# Patient Record
Sex: Male | Born: 1935 | Race: Black or African American | Hispanic: No | Marital: Married | Smoking: Current every day smoker
Health system: Southern US, Community
[De-identification: ages and names within clinical notes are randomized; demographics above are authoritative.]

## PROBLEM LIST (undated history)

## (undated) DIAGNOSIS — I359 Nonrheumatic aortic valve disorder, unspecified: Secondary | ICD-10-CM

## (undated) DIAGNOSIS — I2510 Atherosclerotic heart disease of native coronary artery without angina pectoris: Secondary | ICD-10-CM

## (undated) DIAGNOSIS — E559 Vitamin D deficiency, unspecified: Secondary | ICD-10-CM

## (undated) DIAGNOSIS — C61 Malignant neoplasm of prostate: Secondary | ICD-10-CM

## (undated) DIAGNOSIS — I351 Nonrheumatic aortic (valve) insufficiency: Secondary | ICD-10-CM

## (undated) DIAGNOSIS — F1010 Alcohol abuse, uncomplicated: Secondary | ICD-10-CM

## (undated) DIAGNOSIS — I4510 Unspecified right bundle-branch block: Secondary | ICD-10-CM

## (undated) DIAGNOSIS — J449 Chronic obstructive pulmonary disease, unspecified: Secondary | ICD-10-CM

## (undated) DIAGNOSIS — Z720 Tobacco use: Secondary | ICD-10-CM

## (undated) DIAGNOSIS — R413 Other amnesia: Secondary | ICD-10-CM

## (undated) DIAGNOSIS — N529 Male erectile dysfunction, unspecified: Secondary | ICD-10-CM

## (undated) DIAGNOSIS — K439 Ventral hernia without obstruction or gangrene: Secondary | ICD-10-CM

## (undated) DIAGNOSIS — I4891 Unspecified atrial fibrillation: Secondary | ICD-10-CM

## (undated) DIAGNOSIS — R361 Hematospermia: Secondary | ICD-10-CM

## (undated) DIAGNOSIS — F419 Anxiety disorder, unspecified: Secondary | ICD-10-CM

## (undated) DIAGNOSIS — R269 Unspecified abnormalities of gait and mobility: Principal | ICD-10-CM

## (undated) DIAGNOSIS — I739 Peripheral vascular disease, unspecified: Secondary | ICD-10-CM

## (undated) DIAGNOSIS — G20 Parkinson's disease: Secondary | ICD-10-CM

## (undated) DIAGNOSIS — I482 Chronic atrial fibrillation, unspecified: Secondary | ICD-10-CM

## (undated) DIAGNOSIS — C3490 Malignant neoplasm of unspecified part of unspecified bronchus or lung: Secondary | ICD-10-CM

## (undated) DIAGNOSIS — H409 Unspecified glaucoma: Secondary | ICD-10-CM

## (undated) DIAGNOSIS — Z923 Personal history of irradiation: Secondary | ICD-10-CM

## (undated) HISTORY — DX: Anxiety disorder, unspecified: F41.9

## (undated) HISTORY — DX: Parkinson's disease: G20

## (undated) HISTORY — DX: Chronic atrial fibrillation, unspecified: I48.20

## (undated) HISTORY — DX: Chronic obstructive pulmonary disease, unspecified: J44.9

## (undated) HISTORY — DX: Unspecified glaucoma: H40.9

## (undated) HISTORY — DX: Other amnesia: R41.3

## (undated) HISTORY — DX: Unspecified right bundle-branch block: I45.10

## (undated) HISTORY — DX: Atherosclerotic heart disease of native coronary artery without angina pectoris: I25.10

## (undated) HISTORY — DX: Malignant neoplasm of prostate: C61

## (undated) HISTORY — DX: Vitamin D deficiency, unspecified: E55.9

## (undated) HISTORY — DX: Alcohol abuse, uncomplicated: F10.10

## (undated) HISTORY — DX: Unspecified abnormalities of gait and mobility: R26.9

## (undated) HISTORY — DX: Malignant neoplasm of unspecified part of unspecified bronchus or lung: C34.90

## (undated) HISTORY — DX: Peripheral vascular disease, unspecified: I73.9

## (undated) HISTORY — DX: Hematospermia: R36.1

## (undated) HISTORY — DX: Tobacco use: Z72.0

## (undated) HISTORY — DX: Male erectile dysfunction, unspecified: N52.9

## (undated) HISTORY — PX: VENTRAL HERNIA REPAIR: SHX424

## (undated) HISTORY — DX: Nonrheumatic aortic (valve) insufficiency: I35.1

## (undated) HISTORY — DX: Nonrheumatic aortic valve disorder, unspecified: I35.9

## (undated) HISTORY — DX: Ventral hernia without obstruction or gangrene: K43.9

---

## 1999-08-25 ENCOUNTER — Encounter: Payer: Self-pay | Admitting: Urology

## 1999-08-25 ENCOUNTER — Encounter: Admission: RE | Admit: 1999-08-25 | Discharge: 1999-08-25 | Payer: Self-pay | Admitting: Urology

## 2000-07-02 ENCOUNTER — Inpatient Hospital Stay (HOSPITAL_COMMUNITY): Admission: AD | Admit: 2000-07-02 | Discharge: 2000-07-05 | Payer: Self-pay | Admitting: Internal Medicine

## 2000-07-06 ENCOUNTER — Other Ambulatory Visit (HOSPITAL_COMMUNITY): Admission: RE | Admit: 2000-07-06 | Discharge: 2000-07-23 | Payer: Self-pay | Admitting: Psychiatry

## 2000-10-24 ENCOUNTER — Emergency Department (HOSPITAL_COMMUNITY): Admission: EM | Admit: 2000-10-24 | Discharge: 2000-10-24 | Payer: Self-pay | Admitting: Emergency Medicine

## 2001-10-11 ENCOUNTER — Ambulatory Visit (HOSPITAL_COMMUNITY): Admission: RE | Admit: 2001-10-11 | Discharge: 2001-10-11 | Payer: Self-pay | Admitting: Gastroenterology

## 2001-10-16 ENCOUNTER — Ambulatory Visit (HOSPITAL_COMMUNITY): Admission: RE | Admit: 2001-10-16 | Discharge: 2001-10-16 | Payer: Self-pay | Admitting: Gastroenterology

## 2001-10-16 ENCOUNTER — Encounter: Payer: Self-pay | Admitting: Gastroenterology

## 2001-12-12 ENCOUNTER — Encounter: Admission: RE | Admit: 2001-12-12 | Discharge: 2001-12-12 | Payer: Self-pay | Admitting: Surgery

## 2001-12-12 ENCOUNTER — Encounter: Payer: Self-pay | Admitting: Surgery

## 2001-12-16 ENCOUNTER — Ambulatory Visit (HOSPITAL_BASED_OUTPATIENT_CLINIC_OR_DEPARTMENT_OTHER): Admission: RE | Admit: 2001-12-16 | Discharge: 2001-12-16 | Payer: Self-pay | Admitting: Surgery

## 2002-06-11 ENCOUNTER — Emergency Department (HOSPITAL_COMMUNITY): Admission: EM | Admit: 2002-06-11 | Discharge: 2002-06-11 | Payer: Self-pay | Admitting: *Deleted

## 2002-08-08 ENCOUNTER — Ambulatory Visit (HOSPITAL_COMMUNITY): Admission: RE | Admit: 2002-08-08 | Discharge: 2002-08-08 | Payer: Self-pay | Admitting: Gastroenterology

## 2004-03-23 ENCOUNTER — Inpatient Hospital Stay (HOSPITAL_COMMUNITY): Admission: EM | Admit: 2004-03-23 | Discharge: 2004-03-30 | Payer: Self-pay | Admitting: Psychiatry

## 2004-03-23 ENCOUNTER — Emergency Department (HOSPITAL_COMMUNITY): Admission: EM | Admit: 2004-03-23 | Discharge: 2004-03-23 | Payer: Self-pay | Admitting: *Deleted

## 2004-03-31 ENCOUNTER — Other Ambulatory Visit (HOSPITAL_COMMUNITY): Admission: RE | Admit: 2004-03-31 | Discharge: 2004-06-29 | Payer: Self-pay | Admitting: Psychiatry

## 2004-06-21 ENCOUNTER — Ambulatory Visit (HOSPITAL_COMMUNITY): Admission: RE | Admit: 2004-06-21 | Discharge: 2004-06-21 | Payer: Self-pay | Admitting: Urology

## 2005-02-01 ENCOUNTER — Ambulatory Visit (HOSPITAL_COMMUNITY): Admission: RE | Admit: 2005-02-01 | Discharge: 2005-02-01 | Payer: Self-pay | Admitting: Urology

## 2005-05-22 ENCOUNTER — Inpatient Hospital Stay (HOSPITAL_COMMUNITY): Admission: EM | Admit: 2005-05-22 | Discharge: 2005-05-31 | Payer: Self-pay | Admitting: Emergency Medicine

## 2005-05-27 ENCOUNTER — Ambulatory Visit: Payer: Self-pay | Admitting: Gastroenterology

## 2007-07-19 ENCOUNTER — Emergency Department (HOSPITAL_COMMUNITY): Admission: EM | Admit: 2007-07-19 | Discharge: 2007-07-19 | Payer: Self-pay | Admitting: Emergency Medicine

## 2007-08-31 ENCOUNTER — Emergency Department (HOSPITAL_COMMUNITY): Admission: EM | Admit: 2007-08-31 | Discharge: 2007-08-31 | Payer: Self-pay | Admitting: Family Medicine

## 2008-08-12 ENCOUNTER — Encounter: Admission: RE | Admit: 2008-08-12 | Discharge: 2008-08-12 | Payer: Self-pay | Admitting: Internal Medicine

## 2009-03-06 ENCOUNTER — Inpatient Hospital Stay (HOSPITAL_COMMUNITY): Admission: EM | Admit: 2009-03-06 | Discharge: 2009-03-09 | Payer: Self-pay | Admitting: Emergency Medicine

## 2009-03-08 ENCOUNTER — Encounter (INDEPENDENT_AMBULATORY_CARE_PROVIDER_SITE_OTHER): Payer: Self-pay | Admitting: Internal Medicine

## 2010-07-26 ENCOUNTER — Ambulatory Visit (HOSPITAL_COMMUNITY)
Admission: RE | Admit: 2010-07-26 | Discharge: 2010-07-26 | Payer: Self-pay | Source: Home / Self Care | Admitting: Urology

## 2010-09-17 ENCOUNTER — Encounter: Payer: Self-pay | Admitting: Urology

## 2010-12-04 LAB — BASIC METABOLIC PANEL
BUN: 16 mg/dL (ref 6–23)
CO2: 27 mEq/L (ref 19–32)
Calcium: 9.5 mg/dL (ref 8.4–10.5)
Chloride: 102 mEq/L (ref 96–112)
Creatinine, Ser: 0.95 mg/dL (ref 0.4–1.5)

## 2010-12-04 LAB — DIFFERENTIAL
Basophils Absolute: 0 10*3/uL (ref 0.0–0.1)
Basophils Relative: 0 % (ref 0–1)
Eosinophils Absolute: 0.1 10*3/uL (ref 0.0–0.7)
Neutrophils Relative %: 86 % — ABNORMAL HIGH (ref 43–77)

## 2010-12-04 LAB — CBC
HCT: 38.3 % — ABNORMAL LOW (ref 39.0–52.0)
HCT: 40.4 % (ref 39.0–52.0)
Hemoglobin: 12.7 g/dL — ABNORMAL LOW (ref 13.0–17.0)
Hemoglobin: 13.4 g/dL (ref 13.0–17.0)
MCHC: 33.1 g/dL (ref 30.0–36.0)
MCHC: 33.2 g/dL (ref 30.0–36.0)
MCHC: 33.5 g/dL (ref 30.0–36.0)
MCV: 89.5 fL (ref 78.0–100.0)
MCV: 90.2 fL (ref 78.0–100.0)
Platelets: 170 10*3/uL (ref 150–400)
RBC: 4.48 MIL/uL (ref 4.22–5.81)
RDW: 17.1 % — ABNORMAL HIGH (ref 11.5–15.5)

## 2010-12-04 LAB — COMPREHENSIVE METABOLIC PANEL
Alkaline Phosphatase: 48 U/L (ref 39–117)
BUN: 12 mg/dL (ref 6–23)
BUN: 12 mg/dL (ref 6–23)
CO2: 28 mEq/L (ref 19–32)
Calcium: 8.9 mg/dL (ref 8.4–10.5)
Creatinine, Ser: 0.92 mg/dL (ref 0.4–1.5)
GFR calc non Af Amer: >60 mL/min (ref >60)
Glucose, Bld: 104 mg/dL — ABNORMAL HIGH (ref 70–99)
Glucose, Bld: 109 mg/dL — ABNORMAL HIGH (ref 70–99)
Potassium: 3.7 mEq/L (ref 3.5–5.1)
Total Protein: 6.8 g/dL (ref 6.0–8.3)

## 2010-12-04 LAB — URINALYSIS, ROUTINE W REFLEX MICROSCOPIC
Hgb urine dipstick: NEGATIVE
Nitrite: NEGATIVE
Specific Gravity, Urine: 1.01 (ref 1.005–1.030)
Urobilinogen, UA: 0.2 mg/dL (ref 0.0–1.0)
pH: 5 (ref 5.0–8.0)

## 2010-12-04 LAB — CK TOTAL AND CKMB (NOT AT ARMC)
CK, MB: 1.2 ng/mL (ref 0.3–4.0)
Total CK: 161 U/L (ref 7–232)

## 2010-12-04 LAB — HEPATIC FUNCTION PANEL
ALT: 11 U/L (ref 0–53)
AST: 21 U/L (ref 0–37)
Albumin: 3.1 g/dL — ABNORMAL LOW (ref 3.5–5.2)
Indirect Bilirubin: 0.5 mg/dL (ref 0.3–0.9)
Total Protein: 6.4 g/dL (ref 6.0–8.3)

## 2010-12-04 LAB — T4, FREE: Free T4: 0.92 ng/dL (ref 0.80–1.80)

## 2010-12-04 LAB — PROTIME-INR
INR: 1.1 (ref 0.00–1.49)
Prothrombin Time: 16 seconds — ABNORMAL HIGH (ref 11.6–15.2)

## 2010-12-04 LAB — TSH: TSH: 3.249 u[IU]/mL (ref 0.350–4.500)

## 2010-12-04 LAB — URINE CULTURE: Colony Count: NO GROWTH

## 2010-12-04 LAB — CARDIAC PANEL(CRET KIN+CKTOT+MB+TROPI)
Relative Index: 0.7 (ref 0.0–2.5)
Total CK: 213 U/L (ref 7–232)
Troponin I: 0.02 ng/mL (ref 0.00–0.06)

## 2011-01-10 NOTE — Discharge Summary (Signed)
NAMESHELDON, Mitchell Wood                 ACCOUNT NO.:  [id number]   MEDICAL RECORD NO.:  [id number]          PATIENT TYPE:  INP   LOCATION:  1416                         FACILITY:  [facility]   PHYSICIAN:  Charlestine Massed, MDDATE OF BIRTH:  07-02-1935   DATE OF ADMISSION:  03/06/2009  DATE OF DISCHARGE:  03/09/2009                               DISCHARGE SUMMARY   PRIMARY CARE PHYSICIAN:  Cala Bradford R. [name], M.D.   CARDIOLOGY:  Nanetta Batty, M.D. of [facility].   UROLOGY:  Valetta Fuller, M.D. of [facility].   REASON FOR ADMISSION:  Fever.   DISCHARGE DIAGNOSES:  1. Paroxysmal atrial fibrillation, rate controlled, aspirin.  2. History of prostate cancer.  3. Local reaction to Lupron shot in the right buttock with associated      fever and chills - no evidence of any other localized infection.  4. Glaucoma.  5. Vitamin D deficiency.   DISCHARGE MEDICATIONS:  1. Aspirin 325 mg p.o. daily.  2. Lopressor 12.5 mg p.o. b.i.d.  3. Vitamin D 50,000 units p.o. on Mondays and Fridays.  4. Albuterol MDI two puffs q.6 hours p.r.n. for wheeze.  5. Nicotine patch 21 mg per day for 7 days, then 14 mg per day for 10      days, then 7 mg per day for 10 days and then stop.   HOSPITAL COURSE BY PROBLEM:  1. Local reaction to Lupron - the patient had a Lupron shot as      treatment for his prostate cancer in the afternoon at Dr. Ellin Goodie      office.  He came home and he was fine until late night he suddenly      started getting severe pain in the area associated with fever and      chills.  He did not have any rash.  No wheezing.  No shortness of      breath.  No other issues.  The patient was seen in the ER.  His      temperature was found to be 102 and he did not have any other      symptoms.  The patient had localized tenderness in the area of the      Lupron shot.  So the patient was observed on the floor.  He was      admitted and he was evaluated for  any other further issues causing      fever.  A chest x-ray did not reveal any evidence of infection and      showed chronic lung disease due to his history of tobacco abuse.      His urinalysis was negative for any urinary tract infection and his      CBC showed a slightly elevated WBC count of 17,000 which possibly      could have happened from the inflammatory reaction associated with      the shot.  He did not have any other sources of infection noted.      His mental status was very well, stable.  The patient did not  have      any further fevers and also along with the fever, his pain in the      Lupron shot area also subsided.  There was no further tenderness of      this area so the patient has been advised to speak to Dr. Isabel Caprice      next time when he goes to see him about the reaction he had with      the Lupron shot.  Currently, the site is nontender.  No swelling      noted and no induration noted and no evidence of any internal      abscesses there.  2. Paroxysmal atrial fibrillation - the patient was seen in the      emergency room and he was found to be having atrial fibrillation      and so Cardiology was also called and newly diagnosed atrial      fibrillation.  He had an echocardiogram done also the following      day.  He was seen by [facility], Dr. Allyson Sabal.      Echocardiogram revealed moderate aortic regurgitation and mild      mitral regurgitation with some calcified aortic leaflets.  LA was      mildly dilated with ejection fraction of 55% to 60%.  The patient      was seen by Dr. Allyson Sabal and then by Dr. Lynnea Ferrier.  I spoke to Dr.      Lynnea Ferrier who stated that in view of the very low [place] score, he can      safely go home on aspirin.  The patient also has issues of      increased bleeding from his nose.  The patient stated that if he      laughs loud for a minute or so, he starts bleeding from his nose      and this has been going on for a long time.  So,  in view of these      factors, the patient is being advised to stay on aspirin 325 mg      daily.  The patient's rhythm is currently in sinus rhythm.  He has      paroxysmal atrial fibrillation.  The patient has been advised to      follow up with Dr. Allyson Sabal in 1 weeks' time and at that time further      decision about any Coumadin needs will be decided.  Currently, the      patient is discharged back on aspirin.  3. Aspirin and Lopressor 12.5 b.i.d. for rate control.  4. Tobacco abuse and chronic lung disease - the patient has been      explained about the issues associated with the tobacco abuse.  He      has been told that the x-ray is showing evidence of chronic lung      disease and possibly the paroxysmal atrial fibrillation is      secondary to the lung disease.  The patient is very well motivated      about starting and stopping smoking.  He has been prescribed      nicotine patches and he has been advised to stop smoking.  He has      exhibited understanding with regards to that.  5. History of prostate cancer.  The patient to follow up with Dr.      Isabel Caprice as an outpatient for further management of the condition and  has been advised to speak to Dr. Isabel Caprice about the reaction he      developed to the Lupron shot.   TESTS DONE DURING THIS ADMISSION:  1. Echocardiogram.  Ejection fraction 55% to 60%.  No regional wall      motion abnormalities.  Mitral annular and aortic valve calcified      annulus with mild-to-moderate regurgitation valvularly at 2.13 cm      squared.  Mild mitral regurgitation.  Left atrium mildly dilated.      Mild tricuspid regurgitation with pulmonary artery systolic      pressure 41 mmHg.  2. Chest x-ray shows chronic interstitial lung disease.  No acute      cardiopulmonary process.  3. TSH 3.249 and free T4 is 0.92.   DISPOSITION:  Discharged back home.   FOLLOWUP:  1. Follow up with Dr. Allyson Sabal in 1 week.  2. Follow up with Dr. Andi Devon  in 3 weeks.  3. Follow up with Dr. Isabel Caprice as per the appointment given.   DISCHARGE INSTRUCTIONS:  1. Stop smoking.  2. Adhere to the appointment.  3. If develop any chest pain or any further palpitations, go to the      emergency room.  The patient has been explained about this and he      has exhibited understanding.  4. A total of 40 minutes was spent on the discharge today.      Charlestine Massed, MD  Electronically Signed     UT/MEDQ  D:  03/09/2009  T:  03/09/2009  Job:  [id number]   cc:   Merlene Laughter. [name], M.D.  Fax: [phone]   Valetta Fuller, M.D.  Fax: [phone]   Nanetta Batty, M.D.  Fax: [phone]

## 2011-01-10 NOTE — H&P (Signed)
Mitchell Wood, Mitchell Wood                 ACCOUNT NO.:  [id number]   MEDICAL RECORD NO.:  [id number]          PATIENT TYPE:  INP   LOCATION:  1416                         [facility]ACILITY:  [facility]   PHYSICIAN:  Massie Maroon, MD        DATE OF BIRTH:  17-Jan-1935   DATE OF ADMISSION:  03/06/2009  DATE OF DISCHARGE:                              HISTORY & PHYSICAL   CHIEF COMPLAINT:  Fever.   HISTORY OF PRESENT ILLNESS:  A 76 year old male with prostate cancer  complains of fever of 102 last night.  The patient apparently had  received a Lupron shot yesterday into the right hip/ buttock.  He states  that the area is slightly sore.  There is no redness.  The patient  denies any sore throat, chest pain, palpitations, shortness of breath,  nausea, vomiting, abdominal pain, diarrhea, dysuria, hematuria or  increased frequency of urination.  The patient was brought to the ED by  his wife and found to have slight temperature of 100.1.  He was slightly  tender over the right buttock/ hip area where the injection had  occurred.  There was some slight tenderness however, no redness or  warmth.  Patient's white count was noted to be slightly elevated with a  white count of 17.0 and he was also noted to be slightly tachycardic  with a heart rate of up to 130 in the ED which fluctuated.  I  wonder if  it may been due to pain of palpating on his right buttock where the  Lupron injection was.  The patient will be admitted for observation of  complaints of fever.  There is no clear source at this time.  I wonder  if it may have been a local reaction to the Lupron shot.  He does note  some slight cough but that is nonproductive and his chest x-ray is  negative for any acute process.  We will continue to monitor him for now  for fever.   PAST MEDICAL HISTORY:  1. Prostate cancer.  2. Glaucoma.  3. Vitamin D deficiency.  4. C diff in 2006.   PAST SURGICAL HISTORY:  1. Abdominal hernia repair at [facility]  5  to 6 years ago.  2. 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   SOCIAL HISTORY:  The patient does not drink.  He smokes one pack per day  times 40 years.  He is married to his second wife.  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   FAMILY HISTORY:  His mother died at age 57 of epistaxis.  Father died at  age 38 of old age.  He notes he has a family history of diabetes as well  as cancer.   ALLERGIES:  No known drug allergies.   MEDICATIONS:  1. Travatan.  2. Albuterol.  3. Vitamin D two times per week (International units).  4. Lupron.   REVIEW OF  SYSTEMS:  Positive for smoker's cough, otherwise negative for  all 10 organ systems except for pertinent positives stated above.  Note:  The patient notes no tick bite or tick exposure or recent travel.   PHYSICAL EXAMINATION:  VITAL SIGNS:  Temperature 100.1, pulse 92, blood  pressure 133/57, pulse ox 97% on room air.  HEENT: Anicteric, EOMI, no nystagmus, pupils 1.5 mm, symmetric, direct,  consensual, near reflexes intact, positive arcus senilis, mucous  membranes moist.  Tongue midline.  NECK:  No JVD, no bruit, no thyromegaly, no adenopathy.  HEART:  Intermittently tachy, S1-S2, no murmurs, gallops or rubs.  LUNGS:  Clear to auscultation bilaterally.  ABDOMEN:  Soft, nontender, nondistended.  Positive bowel sounds.  EXTREMITIES:  No cyanosis, clubbing or edema, PT pulses 2+ bilaterally,  onychomycosis.  SKIN:  No rashes.  LYMPHATICS:  No adenopathy, the patient does have some slight tenderness  over the area where he was injected with Lupron.  There is no erythema  or warmth.  There is a large seborrheic keratosis on his right buttock.  NEURO:  Nonfocal, cranial nerves II-XII intact, reflexes 2+, symmetric,  diffusely  downgoing toes bilaterally, motor strength 5/5 in all four  extremities, pinprick  intact.   Chest x-ray:  Stable exam, chronic underlying in his interstitial  changes with no acute cardiopulmonary process.   LABS:  WBC 17.0, hemoglobin 14.3, platelet count 170, sodium 136,  potassium 4.7, BUN 16, creatinine 0.95.  Urinalysis negative.  CPK 161,  MB 1.2, troponin-I is 0.03, D-dimer 0.36.   ASSESSMENT AND [place]velt Harps:  1. Fever:  We will check blood cultures x2 sets.  We will check liver      function tests.  I believe that this is probably a local site      reaction.  There is obviously possibility of occult prostatitis or      possibly some bronchitis.  However, he has not had any sick      contacts and complains of no dysuria or increased frequency.  We      will continue to monitor his temperature curve but hold off on any      antibiotics at this time.  2. Tachycardia (intermittent) possibly secondary to pain or anxiety.      D-dimer is negative.  We will check a 12-lead EKG.  We will check      troponin-I q.8 h. x3 sets.  If he does not have any further      episodes of tachycardia, then the intermittent episode that I saw      in the ED was most likely secondary to pain.  3. Prostate cancer:  The patient can follow-up with his neurologist,      Dr. Patsi Sears as an outpatient.  4. Vitamin D deficiency:  The patient will continue on vitamin D.  The      patient should follow-up with his primary      care physician for vitamin D level.  5. Glaucoma:  We will continue Travatan at this time.  6. DVT prophylaxis:  Lovenox 40 mg subcu q. day.      Massie Maroon, MD  Electronically Signed     JYK/MEDQ  D:  03/06/2009  T:  03/06/2009  Job:  [id number]   cc:   Lynelle Smoke I. Patsi Sears, M.D.  Fax: [phone]   Merlene Laughter. [name], M.D.  Fax: [phone]

## 2011-01-13 NOTE — H&P (Signed)
Mitchell Wood, Mitchell Wood                             ACCOUNT NO.:  [id number]   MEDICAL RECORD NO.:  [id number]                   PATIENT TYPE:  IPS   LOCATION:  0507                                 FACILITY:  [facility]   PHYSICIAN:  Jeanice Lim, M.D.              DATE OF BIRTH:  1935-02-08   DATE OF ADMISSION:  03/23/2004  DATE OF DISCHARGE:                         PSYCHIATRIC ADMISSION ASSESSMENT   IDENTIFYING INFORMATION:  This is a 76 year old widowed African-American  male who is a voluntary admission.   HISTORY OF PRESENT ILLNESS:  This patient presented in the emergency  department after his daughter called 911.  He fell down and had had a  seizure, states that he had been drinking about 1/2 of a fifth of liquor a  day for the past 2 months and had decided about 4 or 5 days prior to this  incident that he was going to stop.  He says that he had stopped completely,  had managed to stay away from the alcohol, but was having terrible problems  with shaking and sweating.  He denies that he has every had a seizure in the  past, although he has had some episodes of blackouts with drinking.  He  reports a history of alcohol use since age 53, with 18 years clean and  sober.  He resume use of alcohol socially when he retired approximately 3  years ago.  Initially he was drinking several times a week socially and over  the past couple of months this has escalated now to drinking about 1/2 a  fifth per day.  He denies any other substance abuse history.  He denies  depressed mood, suicidal thoughts, mood fluctuations or impulsivity.   PAST PSYCHIATRIC HISTORY:  This is the first admission to [facility].  The patient does report that he has a history of  1 prior detox when he was living in the [place] many years ago and does not  remember exactly what hospital he was in.  He denies any history of  depression, suicidal ideation, or prior suicide attempt or mood  problems.   SOCIAL HISTORY:  This is a 76 year old African-American male who was widowed  approximately 5 years ago.  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  He did live in [place], [place] for many years, several years ago  returned to [place] after retirement and lives with [place] in  [place].  He does admit to losing his license approximately 4  years ago due to charges related to alcohol, is due to get his license back  in approximately 4 weeks.  No current DUI charges, no current legal  problems.   FAMILY HISTORY:  Unclear.   ALCOHOL AND DRUG HISTORY:  As noted above.  He denies any other substance  abuse.  He does smoke approximately 1 pack per  day of cigarettes.   PAST MEDICAL HISTORY:  The patient is followed by Dr. Andi Devon, M.D.  who is his primary care physician.  Medical problems include a recent  diagnosis of prostatitis.  The patient was prescribed Cipro 500 mg b.i.d.  for 7 days but says that he really does not remember if he was able to take  any of them at all.   MEDICATIONS:  Cipro 500 mg p.o. in the emergency department, and to take  b.i.d. x7.  He was diagnosed with hypokalemia in the emergency room and was  given 40 mEq of potassium.   DRUG ALLERGIES:  ASPIRIN.   POSITIVE PHYSICAL FINDINGS:  The patient's full physical examination was  done in the [facility] emergency room by Dr. Mosetta Putt and is noted in the  record.  On admission, we note that he is a 5 feet 10 inch tall African-  American male who appears to be his stated age, weighs 183 pounds.  His  vital signs:  He was afebrile, pulse 88, today apically it is regular,  strong.  Radial pulse is synchronous with apical pulse.  Respirations 16,  blood pressure 110/49.  Neuro exam is grossly normal.  Extraocular movements  are normal, no nystagmus.  Grip strength equal.  The patient is fully alert,  tracks well.  Cerebellar function is in intact.  Gait normal, normal  arm  swing, balance good.  Romberg without findings.  No focal findings.  In the  emergency room the patient had an EKG and was cleared.  They did start IV  fluids on him.  He was given one ampule of multivitamin and folate, along  with 2 mg of Ativan.   DIAGNOSTIC STUDIES:  His urine drug screen was negative.  His metabolic  panel revealed hypokalemia with a potassium of 2.9.  His SGOT was elevated  at 66, SGPT at 84.  His alcohol level was less than 5.  His TSH is within  normal limits at 4.769.  CBC was remarkable for platelets at 90.  The  patient displays no obvious bruising or signs of bleeding.  His hemoglobin  15.3, hematocrit 43.8.   MENTAL STATUS EXAM:  This is a fully alert male who is pleasant and  cooperative with smooth motor exam and appears in no distress at the time of  exam.  Speech is normal in pace, tone and amount.  Mood is euthymic,  he is  candid about his alcohol use, matter of fact about his drinking.  Thought  process is logical and coherent, no suicidal or homicidal thought, no signs  of psychosis, no hallucinations, thinking is clear, insight is adequate,  impulse control and judgment within normal limits. Cognitively he is intact  and oriented x3.   ADMISSION DIAGNOSIS:   AXIS I:  Alcohol abuse and dependence.   AXIS II:  No diagnosis.   AXIS III:  Thrombocytopenia, seizure probably secondary to alcohol  withdrawal, and hypokalemia, prostatitis by history.   AXIS IV:  Deferred.   AXIS V:  Current 24, past year 57.   INITIAL PLAN OF CARE:  Voluntarily admit the patient with q.15 minute checks  in place for a safe detox from alcohol.  We have elected to start him on a  Librium protocol and gave him a 50 mg loading dose initially.  We are also  going to recheck his platelets and get a metabolic panel to recheck his  potassium level in the morning and we will  give him a 21 mg nicotine patch and we will check a magnesium level.   ESTIMATED LENGTH OF  STAY:  5 days.     Margaret A. Stephannie Peters                   Jeanice Lim, M.D.    MAS/MEDQ  D:  03/25/2004  T:  03/25/2004  Job:  [id number]

## 2011-01-13 NOTE — H&P (Signed)
Mitchell Wood, Mitchell Wood                 ACCOUNT NO.:  [id number]   MEDICAL RECORD NO.:  [id number]          PATIENT TYPE:  EMS   LOCATION:  MAJO                         FACILITY:  [facility]   PHYSICIAN:  Michaelyn Barter, M.D. DATE OF BIRTH:  [birth year]   DATE OF ADMISSION:  05/22/2005  DATE OF DISCHARGE:                                HISTORY & PHYSICAL   PRIMARY CARE PHYSICIAN:  Dr. Andi Devon   GASTROENTEROLOGIST:  Dr. Anselmo Rod   UROLOGIST:  Dr. Isabel Caprice at the urology center   CHIEF COMPLAINT:  Uncontrollable diarrhea.   HISTORY OF PRESENT ILLNESS:  Mitchell Wood is a 76 year old gentleman with a past  medical history of alcoholism and prostate cancer who states that this past  Saturday he began to experience difficulty controlling both his bladder and  his bowels.  States that his stools had been dark/black with obvious dark  specks of blood mixed in with it.  According to the girlfriend who is at his  bedside the patient has been losing his bowels every couple of hours since  Saturday.  He has defecated over the furniture and the bed at their home.  He currently denies any nausea, vomiting, fevers, or chills.  No pain.  States that he does not have a good sense as to when he has the urge to  defecate.  He denies having any chest pain, no shortness of breath, no  abdominal pain and no palpitations.  His girlfriend states that the patient  has lost approximately 12 pounds over the past month.  The patient states  that he has not had any appetite to eat but continues to drink.  He was seen  by his primary care physician Dr. [name] this morning at approximately 9:30  and subsequently was referred to the hospital for further evaluation.   PAST MEDICAL HISTORY:  1.  Questionable cirrhosis of the liver.  2.  Alcoholism.  3.  Prostate cancer diagnosed 8 years ago.  The patient received irradiation      and hormone therapy for this.  He was in remission for several years  however during the earlier portion of 2006 his cancer was discovered to      have returned.  4.  Alcohol withdrawal related seizures.  5.  Shunt placed in the left arm in May 2006.  6.  Asthma.  7.  The patient had a colonoscopy completed October 11, 2001 by Dr. Charna Elizabeth.  He also had an EGD completed October 11, 2001 which found a      normal-appearing esophagus and proximal small bowel and old heme in the      stomach, no definite source of bleeding identified.   PAST SURGICAL HISTORY:  1.  Umbilical hernia repair.  2.  Penile device insertion.  3.  Bilateral cataract surgery.   ALLERGIES:  No known drug allergies.   HOME MEDICATIONS:  None.   SOCIAL HISTORY:  The patient lives with his girlfriend.  Alcohol:  The  patient and his girlfriend both admit that the patient is an  alcoholic.  He  started drinking at the age of 60.  He has had approximately 20 years  sobriety however started drinking again heavily approximately 2 years ago.  He admits to drinking one fifth of bourbon every day.  His last drink was  yesterday and according to his girlfriend he drank a half gallon of liquor.  Cigarettes:  One pack per day started at the age of 66.   FAMILY HISTORY:  Mother history unknown.  Father has a history of prostate  cancer.  The patient has five brothers all with a history of cancer.   PHYSICAL EXAMINATION:  GENERAL:  The patient is cooperative.  He is awake.  He is in no obvious distress.  Occasionally he appears to be slightly  confused but this may also be secondary to him having difficulties with  regards to hearing.  He has an episode of bowel incontinence in right in  front of me.  He sensed the urge to defecated, but could not make it to the  toilet in time, and passed a large amount of stool, while wearing his  underwear, sitting on the stretcher.  VITALS:  At initial time of admission the patient's temperature was found to  be 99.2, blood pressure 130/76,  heart rate 128, respirations 20, O2  saturation 98% on room air.  HEENT:  Bilateral muddy sclerae.  Pupils are constricted but they do react  equally to light.  Oral mucosa is pink.  No thrush, no exudates are present.  NECK:  The neck is supple.  Strong bilateral carotid upstrokes.  No bruits  are auscultated.  CARDIAC:  Heart sounds are irregular.  RESPIRATORY:  Clear.  No crackles or wheezes.  ABDOMEN:  Soft, nontender, nondistended.  Bowel sounds are present x4  quadrants.  Positive hepatomegaly.  RECTUM:  Poor tone.  Stool is brown, liquidy in consistency.  The patient is  gross Hemoccult positive.  NEUROLOGICAL:  He is alert and oriented x3.  Cranial nerves II-XII are  intact.  MUSCULOSKELETAL:  He has 5/5 upper and lower extremity strength.   LABORATORIES:  White blood cell count is 11.5, hemoglobin 10.4, hematocrit  31, platelets are 83.  Sodium is 135, potassium 3.1, chloride is 90, CO2 is  25, BUN is 11, creatinine is 1.2, glucose 101, calcium is 8.4, total protein  is 6.9, albumin 2.9, AST is 53, ALT is 18, alk phos 74, total bilirubin is  2.5.   ASSESSMENT AND PLAN:  Problem 1. GASTROINTESTINAL BLEED.  The etiology of  this at this particular time is questionable and the differential is broad.  We will consult gastroenterology for further evaluation.  The patient will  most likely need a colonoscopy and/or EGD.  We will check H&H q.6 h. and  provide aggressive intravenous fluid hydration.  We will also provide  Protonix 40 mg daily.   Problem 2. ALCOHOL ABUSE.  We will initiate withdrawal protocol for now as  the patient is high risk to undergo alcohol withdrawal related symptoms.   Problem 3. HISTORY OF PROSTATE CANCER.  We will monitor.   Problem 4. QUESTIONABLE CIRRHOSIS OF THE LIVER.  We will monitor this for  now.   Problem 5. HISTORY OF ATRIAL FIBRILLATION WITH RAPID VENTRICULAR RESPONSE. We will continue Cardizem for now.  We will check cardiac enzymes, a TSH  and  even check an alcohol level.  We will consider cardiology consultation.   Problem 6. HYPOKALEMIA.  We will supplement.   Problem 7.  LEUKOCYTOSIS.  We will monitor this for now as the patient is  afebrile.      Michaelyn Barter, M.D.  Electronically Signed     OR/MEDQ  D:  05/22/2005  T:  05/22/2005  Job:  [id number]   cc:   Merlene Laughter. [name], M.D.  Fax: [phone]   HYQMVH QIO NGEX, M.D.  Fax: [phone]   Valetta Fuller, M.D.  Fax: [phone]

## 2011-01-13 NOTE — Discharge Summary (Signed)
NAMEDEMETRY, Mitchell Wood                 ACCOUNT NO.:  [id number]   MEDICAL RECORD NO.:  [id number]          PATIENT TYPE:  INP   LOCATION:  3714                         FACILITY:  [facility]   PHYSICIAN:  Hillery Aldo, M.D.   DATE OF BIRTH:  Sep 06, 1934   DATE OF ADMISSION:  05/22/2005  DATE OF DISCHARGE:  05/31/2005                                 DISCHARGE SUMMARY   PRIMARY CARE PHYSICIAN:  Dr. Andi Devon.   GASTROENTEROLOGIST:  Dr. Anselmo Rod.   DISCHARGE DIAGNOSES:  1.  Gastrointestinal bleed.  2.  Anemia secondary to #1.  3.  Gastritis.  4.  C. difficile colitis.  5.  Alcohol abuse with delirium tremors and withdrawal syndrome.  6.  History of recurrent prostate cancer.  7.  Aspiration pneumonia.  8.  Tachycardia.  9.  Hypokalemia.  10. Hypomagnesemia.   DISCHARGE MEDICATIONS:  1.  Multivitamin daily.  2.  Metronidazole 500 mg p.o. three times daily x8 more days.  3.  Lopressor 25 mg p.o. twice daily.  4.  Protonix 40 mg daily.  5.  Magnesium oxide 400 mg daily x1 week.   CONSULTATIONS:  Dr. Jeani Hawking of Gastroenterology.   PROCEDURES AND DIAGNOSTIC STUDIES:  1.  Chest x-ray on May 22, 2005 showed stable atelectasis and/or      scarring, right lower lobe.  Mild aortic elongation and calcification.      No acute infiltrate, edema, or pneumothorax.  2.  Repeat chest x-ray on May 28, 2005 showed a new opacity in the right      hilar and perihilar region.  Airspace disease is a consideration.      Followup films to resolution are recommended.  3.  Panendoscopy on May 27, 2005 showed mild, nonspecific gastritis.      No ulcerations or erosion. H.  pylori testing was negative.   DISCHARGE LABORATORY VALUES:  CBC showed a white blood cell count of 8.6,  hemoglobin 8.8, hematocrit 26.3, platelets 304,000.  Sodium was 136,  potassium 3.5, chloride 105, bicarb 22, BUN 5, creatinine 0.7, glucose 104.  Magnesium was 1.0.   BRIEF ADMISSION HISTORY OF  PRESENT ILLNESS:  The patient is a 76 year old  male with a past medical history of alcohol abuse, who came into the  hospital on May 22, 2005 with complaints of black diarrheal stools,  poor appetite, weight loss, and history of heavy alcohol abuse.  He was  admitted for further evaluation and workup.   HOSPITAL COURSE BY PROBLEM.:  PROBLEM #1 - GASTROINTESTINAL BLEED WITH BLOOD  LOSS ANEMIA:  The patient was admitted and consultation with  Gastroenterology was obtained.  Serial checks of his hemoglobin were done.  He subsequently underwent endoscopy on May 27, 2005 with nonspecific  gastritis and no active bleeding.  Gastroenterology recommended proton pump  inhibitor therapy daily.  H. Pylori was negative.  His anemia remained  stable throughout the course of his hospitalization, running 8-9 g on  average.  He will need close followup as an outpatient to ensure that his  hemoglobin remains stable.   PROBLEM # 2 -  C. DIFFICILE COLITIS:  The patient did have significant  diarrhea on admission and a stool was sent for toxin analysis.  It did come  back positive and the patient was started on Flagyl.  He has completed a 7-  day course of Flagyl in the hospital and will need an additional 8 days of  therapy to ensure eradication.  He is at high risk for reinfection, so he  should follow up with his primary care physician for any further episodes of  diarrhea, once his treatment course is completed.   PROBLEM #3 - ALCOHOL ABUSE:  The patient did admit to significant alcohol  abuse and did develop withdrawal with delirium.  He was treated with an  Ativan taper.  He responded well and at the time of this dictation, is  mentating clearly.  He was also maintained on thiamine and folate  supplementation.  He will be referred for outpatient alcohol abuse  counseling on discharge.   PROBLEM #4 - QUESTIONABLE ASPIRATION PNEUMONIA:  The patient did have a  chest x-ray that was  worrisome for aspiration pneumonia.  He had low-grade  fevers, but no elevation of white blood cell count.  Given this, he was  empirically started on Avelox, but this was discontinued after 2 days of  therapy, given the concerns of his concomitant therapy for C. difficile  colitis.  Unless he develops clear evidence of pneumonia, wound avoid using  any other antibiotics if at all possible.   PROBLEM #5 - SINUS TACHYCARDIA:  The patient was briefly treated with  Cardizem.  It was thought that his tachycardia was reactionary to his blood  loss anemia.  This should improve when his underlying anemia resolves.   PROBLEM #6 - DECONDITIONING:  The patient did have physical therapy and  occupational therapy.  It was deemed that he was unsafe to be home alone.  Apparently, he has a girlfriend who stays with him 24 hours.  He adamantly  refuses a skilled nursing facility or an assisted living facility.  He will  therefore be discharged home with home health care nursing, home physical  therapy, and a Social Work evaluation to assess his home environment for  safety.   DISPOSITION:  The patient will be discharged home today with instructions to  follow up with his primary care physician in approximately 1 week's time.  At that visit, he should have a check of his electrolytes including  magnesium and his hemoglobin.  He is instructed to call his physician for  any dizziness, return of diarrhea or bloody stools.   CONDITION AT DISCHARGE:  Improved.           ______________________________  Hillery Aldo, M.D.     CR/MEDQ  D:  05/31/2005  T:  05/31/2005  Job:  [id number]   cc:   Merlene Laughter. [name], M.D.  Fax: [phone]   JXBJYN WGN FAOZ, M.D.  Fax: [phone]

## 2011-01-13 NOTE — Discharge Summary (Signed)
Mitchell Wood, Mitchell Wood                             ACCOUNT NO.:  [id number]   MEDICAL RECORD NO.:  [id number]                   PATIENT TYPE:  IPS   LOCATION:  0507                                 [facility]ACILITY:  [facility]   PHYSICIAN:  Geoffery Lyons, M.D.                   DATE OF BIRTH:  1935-05-20   DATE OF ADMISSION:  03/23/2004  DATE OF DISCHARGE:  03/30/2004                                 DISCHARGE SUMMARY   CHIEF COMPLAINT AND PRESENTING ILLNESS:  This was the first admission to  [facility]  for this 76 year old widowed African-American  male, voluntarily admitted.  Presented to the emergency department after his  daughter called 911, fell down, had a seizure.  Had been drinking about 1/2  a fifth of liquor a day for the past 2 months.  Had decided about 4-5 days  prior to this incident that he was going to stop.  He had stopped completely  and managed to stay away from alcohol, was having a problem with shaking and  sweating, denied he had ever had a seizure.  Some episodes of blackouts with  drinking, history of alcohol, using since age 29, 18 years clean and sober.  Started using alcohol when he retired 3 years ago.  Continued to drink  several times a week socially, but in the past couple of months it  escalated, now drinking about 1/2 a fifth per day.   PAST PSYCHIATRIC HISTORY:  First time [facility], he has  history of 1 prior detox when he was in the [place].   ALCOHOL AND DRUG HISTORY:  As already stated, persistent use of alcohol, no  other substances.   PAST MEDICAL HISTORY:  Prostatitis, prescribed Cipro 500 twice a day.   MEDICATIONS:  Cipro 500 1 twice a day for 7 days, hypokalemia replaced with  potassium 40 mEq.   PHYSICAL EXAMINATION:  Performed, failed to show any acute findings.   LABORATORY WORKUP:  Blood chemistries:  Potassium 2.7 replaced to 4.4.  SGOT  63, SGPT 84, total bilirubin 0.1.  TSH 4.769.   MENTAL STATUS EXAM:  Reveals an  alert, cooperative male, pleasant and  cooperative.  Smooth motor exam, appears in no distress.  Speech was normal  in pace, tone and amount.  Mood was euthymic, candid about his alcohol use,  matter of fact about his drinking.  Thought processes logical and coherent,  no suicidal or homicidal thoughts, no evidence of delusions, no  hallucinations.  Insight is adequate.  Impulse control and judgment within  normal limits. Cognition well preserved.   ADMISSION DIAGNOSES:   AXIS I:  1.  Alcohol dependence.  2.  Alcohol withdrawal.   AXIS II:  No diagnosis.   AXIS III:  Thrombocytopenia, alcohol withdrawal seizure, hypokalemia,  prostatitis.   AXIS IV:  Moderate.   AXIS V:  Global assessment of function  upon admission 25, highest global  assessment of function in past year 60-65.   COURSE IN HOSPITAL:  He was admitted and started on intensive individual and  group psychotherapy.  He was detoxified with Librium and he was maintained  on Cipro.  He continued to be replaced with potassium.  His detoxification  went uneventfully.  His mood improved, his affect was bright, he was  sleeping better, endorsed no active tremors, no nausea and vomiting.  As he  had a hard time staying sober, had the alcohol withdrawal seizure, felt that  he was motivated to do whatever he needs to do to stay asinine this time  around.  He continued to be detoxed.  He did say that when he retired he  started drinking, initially socially, built up, so he had a good relapse  prevention plan, and on August 3 he was in full contact with reality, no  suicidal ideas, no homicidal ideas, no hallucinations, no delusions, no  active withdrawal, very stable, willing to pursue further abstinence, go to  CDIOP and go to 12 step meetings.   DISCHARGE DIAGNOSES:   AXIS I:  Alcohol dependence, status post alcohol withdrawal.   AXIS II:  No diagnosis.   AXIS III:  Alcohol withdrawal seizure, prostatitis,  thrombocytopenia,  hypokalemia, corrected.   AXIS IV:  Moderate.   AXIS V:  Global assessment of function upon discharge 50-55.   DISCHARGE MEDICATIONS:  Trazodone 50 at bedtime for sleep.   DISPOSITION:  Follow up [facility].                                               Geoffery Lyons, M.D.    IL/MEDQ  D:  04/27/2004  T:  04/29/2004  Job:  [id number]

## 2011-01-13 NOTE — Consult Note (Signed)
Mitchell Wood                 ACCOUNT NO.:  [id number]   MEDICAL RECORD NO.:  [id number]          PATIENT TYPE:  INP   LOCATION:  3714                         FACILITY:  [facility]   PHYSICIAN:  Mitchell Hawks. Elnoria Howard, MD    DATE OF BIRTH:  Apr 11, 1935   DATE OF CONSULTATION:  05/24/2005  DATE OF DISCHARGE:                                   CONSULTATION   REFERRING PHYSICIAN:  Dr. Michaelyn Barter   REASON FOR CONSULTATION:  Evaluation for GI bleed.   [name] is a 76 year old African-American male with a past medical history  of alcohol abuse and prostate cancer.  Complains of passing black diarrhea  stool since Saturday.  He is a poor historian.  He complains that he has  been moving his bowels every one hour and has been unable to control his  bowel movements.  No history of fever, nausea, vomiting, or hematemesis.  He  has had poor appetite, has lost weight, and has been drinking heavily  recently.  He does complain of some retching.  No history of dysphagia or  icterus.  Denies use of NSAIDs or blood thinners.  He gives no history of  EGD or colonoscopy in the past.  This morning he passed a foul smelling dark  to greenish-colored stool.  At the time of admission he had a rapid  ventricular response and was put on Cardizem.   REVIEW OF SYSTEMS:  No history of chest pain.  No history of shortness of  breath.  Palpitations present.  Fever absent.  Urinary incontinence present.  Rash absent.  Headache absent.  Seizures absent.   PAST MEDICAL HISTORY:  1.  Alcohol abuse.  2.  Prostate cancer diagnosed eight years ago, recurred in 2006.  3.  Alcohol withdrawal seizures.  4.  Umbilical hernia repair.  5.  Penile device insertion.   ALLERGIES:  No known drug allergies.   MEDICATIONS:  None.   SOCIAL HISTORY:  Drinks heavily.  Smokes one pack per day.   FAMILY HISTORY:  Father died of prostate cancer.   PHYSICAL EXAMINATION:  VITAL SIGNS:  Blood pressure 130/68, heart rate 90,  respirations 18, CO2 95%.  GENERAL:  Patient is awake, is slightly drowsy, is or normal build.  HEENT:  Icterus present.  Pallor absent.  Throat examination normal.  Extraocular movements intact.  CVS:  S1, S2 present.  Irregular rate.  RESPIRATORY:  Clear to auscultation bilaterally.  ABDOMEN:  Bowel sounds present.  Nontender, nondistended.  CNS:  Oriented x3.  Motor and sensory system intact.  Cranial nerves II-XII  intact.  EXTREMITIES:  Edema absent.  Tremor absent.   LABORATORIES:  Hemoglobin 9.2, hematocrit 27.1.  PTT 42, PT 14.8, INR 1.  Bilirubin 1.8, alkaline phosphatase 73, AST 56, ALT 16, total protein 6,  albumin 2.6, calcium 7.7.  Fecal occult blood test positive.   ASSESSMENT/PLAN:  This 76 year old African-American male with a past medical  history of alcohol abuse and prostate cancer and a current history of heavy  drinking presents with melena, a low hemoglobin, and elevated LFTs.  IMPRESSION:  1.  Upper gastrointestinal bleed, gastritis versus esophagitis versus      Mallory-Weiss tear versus esophageal varices.  2.  Alcohol abuse with some features of delirium tremens.  3.  Alcoholic liver disease.  4.  Prostate cancer.  5.  Atrial fibrillation.   PLAN:  Patient is currently in DTs.  It is safe to undergo EGD as he does  not have a marked decrease in his hemoglobin.  Will follow the patient and  will do an EGD as patient stabilizes.      Ronda Fairly, M.D.      Mitchell Hawks Elnoria Howard, MD  Electronically Signed    YB/MEDQ  D:  05/24/2005  T:  05/25/2005  Job:  [id number]   cc:   Michaelyn Barter, M.D.

## 2011-09-16 ENCOUNTER — Encounter (HOSPITAL_COMMUNITY): Payer: Self-pay | Admitting: *Deleted

## 2011-09-16 ENCOUNTER — Emergency Department (HOSPITAL_COMMUNITY)
Admission: EM | Admit: 2011-09-16 | Discharge: 2011-09-16 | Disposition: A | Discharge status: Home / Self Care | Payer: Medicare Other | Attending: Emergency Medicine | Admitting: Emergency Medicine

## 2011-09-16 DIAGNOSIS — R040 Epistaxis: Secondary | ICD-10-CM | POA: Insufficient documentation

## 2011-09-16 DIAGNOSIS — I4891 Unspecified atrial fibrillation: Secondary | ICD-10-CM | POA: Insufficient documentation

## 2011-09-16 DIAGNOSIS — Z79899 Other long term (current) drug therapy: Secondary | ICD-10-CM | POA: Insufficient documentation

## 2011-09-16 DIAGNOSIS — Z7982 Long term (current) use of aspirin: Secondary | ICD-10-CM | POA: Insufficient documentation

## 2011-09-16 DIAGNOSIS — Z7901 Long term (current) use of anticoagulants: Secondary | ICD-10-CM | POA: Insufficient documentation

## 2011-09-16 HISTORY — DX: Unspecified atrial fibrillation: I48.91

## 2011-09-16 LAB — CBC
HCT: 34.4 % — ABNORMAL LOW (ref 39.0–52.0)
MCH: 28.9 pg (ref 26.0–34.0)
MCV: 84.9 fL (ref 78.0–100.0)
Platelets: 181 10*3/uL (ref 150–400)
RBC: 4.05 MIL/uL — ABNORMAL LOW (ref 4.22–5.81)
WBC: 10.2 10*3/uL (ref 4.0–10.5)

## 2011-09-16 MED ORDER — OXYMETAZOLINE HCL 0.05 % NA SOLN
1.0000 | Freq: Once | NASAL | Status: AC
  Administered 2011-09-16: 1 via NASAL
  Filled 2011-09-16 (×2): qty 15

## 2011-09-16 NOTE — ED Provider Notes (Signed)
  I performed a history and physical examination of Mitchell Wood and discussed his management with Dr. Meredith Wood.  I agree with the history, physical, assessment, and plan of care, with the following exceptions: None  2-3 hours of epistaxis to the right nare. No associated pain.  Clot in the right knee are. There is no active bleeding. There is a area of erosion which is the likely cause of his bleeding. There is no evidence of blood in the posterior oropharynx  I was present for the following procedures: None Time Spent in Critical Care of the patient: None  Afrin and direct pressure.  Clot removed and bleeding controlled. No indication for rhino-rocket.  inr normal.  Mitchell Husky, MD 09/16/11 [id number]

## 2011-09-16 NOTE — ED Notes (Signed)
Patient with nose bleeding intermittently since this am.  Patient is currently taking coumadin

## 2011-09-16 NOTE — ED Provider Notes (Signed)
History     CSN: [id number]  Arrival date & time 09/16/11  1919   First MD Initiated Contact with Patient 09/16/11 2131      Chief Complaint  Patient presents with  . Epistaxis    (Consider location/radiation/quality/duration/timing/severity/associated sxs/prior treatment) Patient is a 77 y.o. male presenting with nosebleeds. The history is provided by the patient and the spouse.  Epistaxis  This is a recurrent problem. Episode onset: 2-3 hours ago. Episode frequency: intermittent. The problem has been resolved. The problem is associated with anticoagulants. The bleeding has been from the right nare. He has tried applying pressure for the symptoms. The treatment provided significant relief. His past medical history does not include sinus problems.    Past Medical History  Diagnosis Date  . Atrial fibrillation     History reviewed. No pertinent past surgical history.  History reviewed. No pertinent family history.  History  Substance Use Topics  . Smoking status: Current Everyday Smoker    Types: Cigarettes  . Smokeless tobacco: Not on file  . Alcohol Use: No      Review of Systems  Constitutional: Negative for fever, chills, activity change, appetite change and fatigue.  HENT: Positive for nosebleeds. Negative for congestion, sore throat, rhinorrhea, neck pain and neck stiffness.   Eyes: Negative for photophobia, redness and visual disturbance.  Respiratory: Negative for cough, shortness of breath and wheezing.   Cardiovascular: Negative for chest pain, palpitations and leg swelling.  Gastrointestinal: Negative for nausea, vomiting, abdominal pain, diarrhea, constipation and blood in stool.  Genitourinary: Negative for dysuria, urgency, hematuria and flank pain.  Musculoskeletal: Negative for back pain.  Skin: Negative for rash and wound.  Neurological: Negative for dizziness, seizures, facial asymmetry, speech difficulty, weakness, light-headedness, numbness and  headaches.  Psychiatric/Behavioral: Negative for confusion.  All other systems reviewed and are negative.    Allergies  Review of patient's allergies indicates no known allergies.  Home Medications   Current Outpatient Rx  Name Route Sig Dispense Refill  . ASPIRIN EC 81 MG PO TBEC Oral Take 81 mg by mouth daily.    Marland Kitchen METOPROLOL TARTRATE 25 MG PO TABS Oral Take 12.5 mg by mouth 2 (two) times daily.    . TRAVOPROST (BAK FREE) 0.004 % OP SOLN Both Eyes Place 1 drop into both eyes at bedtime.    . WARFARIN SODIUM 3 MG PO TABS Oral Take 3-4.5 mg by mouth daily. Tues, Sun, and Thurs-1.5 tab (total 4.5mg ); All other days-1 tab (total 3mg )      BP 116/70  Pulse 86  Temp(Src) 97.9 F (36.6 C) (Oral)  Resp 16  SpO2 98%  Physical Exam  Nursing note and vitals reviewed. Constitutional: He is oriented to person, place, and time. He appears well-developed and well-nourished.  Non-toxic appearance. No distress.  HENT:  Head: Normocephalic and atraumatic.  Nose: Right sinus exhibits no maxillary sinus tenderness and no frontal sinus tenderness. Left sinus exhibits no maxillary sinus tenderness and no frontal sinus tenderness.  Mouth/Throat: Oropharynx is clear and moist.       Left nostril normal.  Right nostril with large clot in nare.   Eyes: Conjunctivae and EOM are normal. Pupils are equal, round, and reactive to light. No scleral icterus.  Neck: Normal range of motion. Neck supple. No JVD present.  Cardiovascular: Normal rate, regular rhythm, normal heart sounds and intact distal pulses.   No murmur heard. Pulmonary/Chest: Effort normal and breath sounds normal. No respiratory distress. He has no wheezes. He  has no rales.  Abdominal: Soft. Bowel sounds are normal. He exhibits no distension. There is no tenderness. There is no rebound and no guarding.  Musculoskeletal: Normal range of motion.  Neurological: He is alert and oriented to person, place, and time. He has normal strength. No  cranial nerve deficit. GCS eye subscore is 4. GCS verbal subscore is 5. GCS motor subscore is 6.  Skin: Skin is warm and dry. No rash noted. He is not diaphoretic.  Psychiatric: He has a normal mood and affect.    ED Course  Procedures (including critical care time)  Labs Reviewed  CBC - Abnormal; Notable for the following:    RBC 4.05 (*)    Hemoglobin 11.7 (*)    HCT 34.4 (*)    RDW 16.0 (*)    All other components within normal limits  PROTIME-INR - Abnormal; Notable for the following:    Prothrombin Time 29.4 (*)    INR 2.73 (*)    All other components within normal limits   No results found.   1. Epistaxis       MDM  76yo AAM with PMH significant for HTN and atrial fibrillation on coumadin who presents to the ED due to epistaxis from right nostril. Onset today. Has had this in the past as well as GIB while on coumadin. Was taken off coumadin and no nosebleeding. Pt now back on coumadin for several months. Pt states dry air in his home makes it worse. If he is out of the home it doesn't happen. No trauma. No recent illness. No other symptoms such as lightheadedness or dizziness or weakness. Will check Hg and INR. Will evacuate clot and see if able to find source of bleeding.   Clot cleared with nose blowing. No rebleeding. INR therapeutic. Hg WNL.   Small 3mm area of erosion right nare medially in anterior nose. No active bleeding. Afrin nasal spray applied. No bleeding. Recommend pressure to nose for next 30 min. Pt instructed to f/u with pcp in 2 days.         Verne Carrow, MD 09/16/11 [phone]

## 2011-09-16 NOTE — ED Notes (Signed)
Patient c/o nosebleed since this morning. I kept pressure for 15 minutes, bleeding stopped at this time. Instructed patient not to blow his nose or try to wipe it at this moment.

## 2011-10-11 DIAGNOSIS — I351 Nonrheumatic aortic (valve) insufficiency: Secondary | ICD-10-CM

## 2011-10-11 HISTORY — DX: Nonrheumatic aortic (valve) insufficiency: I35.1

## 2011-12-19 ENCOUNTER — Ambulatory Visit
Admission: RE | Admit: 2011-12-19 | Discharge: 2011-12-19 | Disposition: A | Discharge status: Home / Self Care | Payer: Medicare Other | Source: Ambulatory Visit | Attending: Internal Medicine | Admitting: Internal Medicine

## 2011-12-19 ENCOUNTER — Other Ambulatory Visit: Payer: Self-pay | Admitting: Internal Medicine

## 2011-12-19 DIAGNOSIS — J449 Chronic obstructive pulmonary disease, unspecified: Secondary | ICD-10-CM

## 2012-06-27 ENCOUNTER — Encounter: Payer: Self-pay | Admitting: *Deleted

## 2012-06-27 ENCOUNTER — Encounter: Payer: Self-pay | Admitting: Internal Medicine

## 2012-06-28 ENCOUNTER — Institutional Professional Consult (permissible substitution): Payer: Medicare Other | Admitting: Internal Medicine

## 2012-07-01 ENCOUNTER — Ambulatory Visit (INDEPENDENT_AMBULATORY_CARE_PROVIDER_SITE_OTHER): Payer: Medicare Other | Admitting: Internal Medicine

## 2012-07-01 ENCOUNTER — Encounter: Payer: Self-pay | Admitting: Internal Medicine

## 2012-07-01 VITALS — BP 104/60 | HR 88 | Temp 97.7°F | Ht 68.0 in | Wt 189.0 lb

## 2012-07-01 DIAGNOSIS — F17200 Nicotine dependence, unspecified, uncomplicated: Secondary | ICD-10-CM

## 2012-07-01 DIAGNOSIS — Z129 Encounter for screening for malignant neoplasm, site unspecified: Secondary | ICD-10-CM

## 2012-07-01 DIAGNOSIS — R062 Wheezing: Secondary | ICD-10-CM

## 2012-07-01 NOTE — Patient Instructions (Addendum)
Please have full PFt breathing test Please do walk test on room air CMA will ensure you are uptodate with flu and pneumovax REturn to see me or NP Tammy after above At followup we can discuss low dose CT chest for cancer screening and quit smoking

## 2012-07-01 NOTE — Progress Notes (Signed)
Subjective:    Patient ID: Mitchell Wood, male    DOB: 1934-09-21, 77 y.o.   MRN: [id number]  HPI  PCP is Alva Garnet., MD Body mass index is 28.74 kg/(m^2).  reports that he has been smoking Cigarettes.  He has a 50 pack-year smoking history. He does not have any smokeless tobacco history on file.   IOV 07/01/2012 77 year old AA male.  Referred per him "check my chest out". He is poor historian. Wife supplementing hx. Wife says he is a typical male who will not go to MD, do tests, follow instructions but she is going to be strict with him and ensures he follows directions  C/o Wheezing x years. Insidious onset. On and off. Triggered by dust, cigarettes (still smokes). Improved by no specific factors but wife recollects 3 years ago some inhaler might have helped and 3 weeks ago was given symbicort which he thinks helps. He is using symbicort past week. Severity fluctutates between mild and moderate. Symptoms stable over time. There is associated dry cough daily morning that is improved by getting fresh air. 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 Trying to quit he says by praying or thinking about it. He says he can go for several hours wihtout smoking. If he is not busy he will smoke    CAT COPD Symptom & Quality of Life Score (GSK trademark) 0 is no burden. 5 is highest burden 07/01/2012   Never Cough -> Cough all the time 3  No phlegm in chest -> Chest is full of phlegm 2  No chest tightness -> Chest feels very tight 0  No dyspnea for 1 flight stairs/hill -> Very dyspneic for 1 flight of stairs 1  No limitations for ADL at home  -> Very limited with ADL at home 0  Confident leaving home -> Not at all confident leaving home 0  Sleep soundly -> Do not sleep soundly because of lung condition 1  Lots of Energy -> No energy at all 2  TOTAL Score (max 40)         LAbs  2010 creat is 0.9mg % Jan 2013 - hgb is 11.7gm% April 2013 - CXR is hypperinflation. No infiltrates     Past Medical History  Diagnosis Date  . Atrial fibrillation   . Anxiety   . ED (erectile dysfunction)   . ETOH abuse     liver disease  . Hematospermia   . Prostate cancer   . Ventral hernia   . COPD (chronic obstructive pulmonary disease)   . Vitamin D deficiency   . Tobacco abuse      Family History  Problem Relation Age of Onset  . Emphysema Brother   . Allergies      whole family     History   Social History  . Marital Status: Married    Spouse Name: N/A    Number of Children: N/A  . Years of Education: N/A   Occupational History  . retired    Social History Main Topics  . Smoking status: Current Every  Day Smoker -- 1.0 packs/day for 50 years    Types: Cigarettes  . Smokeless tobacco: Not on file     Comment: 30+ yr  . Alcohol Use: No  . Drug Use: Not on file  . Sexually Active: Not on file   Other Topics Concern  . Not on file   Social History Narrative  . No narrative on file     No Known Allergies   Outpatient Prescriptions Prior to Visit  Medication Sig Dispense Refill  . aspirin EC 81 MG tablet Take 81 mg by mouth daily.      . metoprolol tartrate (LOPRESSOR) 25 MG tablet Take 12.5 mg by mouth 2 (two) times daily.      . Travoprost, BAK Free, (TRAVATAN) 0.004 % SOLN ophthalmic solution Place 1 drop into both eyes at bedtime.      . [DISCONTINUED] warfarin (COUMADIN) 3 MG tablet Take 3-4.5 mg by mouth daily. Tues, Sun, and Thurs-1.5 tab (total 4.5mg ); All other days-1 tab (total 3mg )       Last reviewed on 07/01/2012  9:22 AM by Darrell Jewel, CMA     Review of Systems  Constitutional:  Negative for fever and unexpected weight change.  HENT: Negative for ear pain, nosebleeds, congestion, sore throat, rhinorrhea, sneezing, trouble swallowing, dental problem, postnasal drip and sinus pressure.   Eyes: Negative for redness and itching.  Respiratory: Positive for cough, shortness of breath and wheezing. Negative for chest tightness.   Cardiovascular: Positive for palpitations. Negative for leg swelling.  Gastrointestinal: Negative for nausea and vomiting.  Genitourinary: Negative for dysuria.  Musculoskeletal: Negative for joint swelling.  Skin: Negative for rash.  Neurological: Negative for headaches.  Hematological: Does not bruise/bleed easily.  Psychiatric/Behavioral: Negative for dysphoric mood. The patient is not nervous/anxious.        Objective:   Physical Exam  Nursing note and vitals reviewed. Constitutional: He is oriented to person, place, and time. He appears well-developed and well-nourished. No distress.  HENT:  Head: Normocephalic and atraumatic.  Right Ear: External ear normal.  Left Ear: External ear normal.  Mouth/Throat: Oropharynx is clear and moist. No oropharyngeal exudate.       Smells of tobacco Partially edentulous  Eyes: Conjunctivae normal and EOM are normal. Pupils are equal, round, and reactive to light. Right eye exhibits no discharge. Left eye exhibits no discharge. No scleral icterus.  Neck: Normal range of motion. Neck supple. No JVD present. No tracheal deviation present. No thyromegaly present.  Cardiovascular: Normal rate, regular rhythm and intact distal pulses.  Exam reveals no gallop and no friction rub.   No murmur heard. Pulmonary/Chest: Effort normal. No respiratory distress. He has no wheezes. He has rales. He exhibits no tenderness.       Mild barrel chest Crackles at base +  Abdominal: Soft. Bowel sounds are normal. He exhibits no distension and no mass. There is no tenderness. There is no rebound and no guarding.    Musculoskeletal: Normal range of motion. He exhibits no edema and no tenderness.  Lymphadenopathy:    He has no cervical adenopathy.  Neurological: He is alert and oriented to person, place, and time. He has normal reflexes. No cranial nerve deficit. Coordination normal.  Skin: Skin is warm and dry. No rash noted. He is not diaphoretic. No erythema. No pallor.  Psychiatric: He has a normal mood and affect. His behavior is normal. Judgment and thought content normal.          Assessment &  Plan:

## 2012-07-10 ENCOUNTER — Encounter: Payer: Self-pay | Admitting: Internal Medicine

## 2012-07-10 DIAGNOSIS — J449 Chronic obstructive pulmonary disease, unspecified: Secondary | ICD-10-CM | POA: Insufficient documentation

## 2012-07-10 DIAGNOSIS — F17200 Nicotine dependence, unspecified, uncomplicated: Secondary | ICD-10-CM | POA: Insufficient documentation

## 2012-07-10 DIAGNOSIS — Z129 Encounter for screening for malignant neoplasm, site unspecified: Secondary | ICD-10-CM | POA: Insufficient documentation

## 2012-07-10 NOTE — Assessment & Plan Note (Signed)
Needs to quit but reportedly stubborn per wife . Will address at future visit

## 2012-07-10 NOTE — Assessment & Plan Note (Signed)
?   Wheeze due to copd PLAN Please have full PFt breathing test Please do walk test on room air CMA will ensure you are uptodate with flu and pneumovax REturn to see me or NP Tammy after above

## 2012-07-10 NOTE — Assessment & Plan Note (Signed)
He fits profile for  Low dose ct chest for lung cancer screen. Will discuss at fu

## 2012-07-24 ENCOUNTER — Ambulatory Visit (INDEPENDENT_AMBULATORY_CARE_PROVIDER_SITE_OTHER): Payer: Medicare Other | Admitting: Internal Medicine

## 2012-07-24 ENCOUNTER — Encounter: Payer: Self-pay | Admitting: Internal Medicine

## 2012-07-24 VITALS — BP 92/60 | HR 95 | Temp 97.4°F | Ht 68.0 in | Wt 186.0 lb

## 2012-07-24 DIAGNOSIS — R062 Wheezing: Secondary | ICD-10-CM

## 2012-07-24 DIAGNOSIS — Z129 Encounter for screening for malignant neoplasm, site unspecified: Secondary | ICD-10-CM

## 2012-07-24 DIAGNOSIS — F17200 Nicotine dependence, unspecified, uncomplicated: Secondary | ICD-10-CM

## 2012-07-24 LAB — PULMONARY FUNCTION TEST

## 2012-07-24 NOTE — Progress Notes (Signed)
Subjective:    Patient ID: Mitchell Wood, male    DOB: 01-03-1935, 77 y.o.   MRN: [id number]  HPI PCP is Alva Garnet., MD Body mass index is 28.74 kg/(m^2).  reports that he has been smoking Cigarettes.  He has a 50 pack-year smoking history.    IOV 07/01/2012 77 year old AA male.  50 pack current smoker. Referred per him "check my chest out". He is poor historian. Wife supplementing hx. Wife says he is a typical male who will not go to MD, do tests, follow instructions but she is going to be strict with him and ensures he follows directions  C/o Wheezing x years. Insidious onset. On and off. Triggered by dust, cigarettes (still smokes). Improved by no specific factors but wife recollects 3 years ago some inhaler might have helped and 3 weeks ago was given symbicort which he thinks helps. He is using symbicort past week. Severity fluctutates between mild and moderate. Symptoms stable over time. There is associated dry cough daily morning that is improved by getting fresh air. 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 Trying to quit he says by praying or thinking about it. He says he can go for several hours wihtout smoking. If he is not busy he will smoke     LAbs  2010 creat is 0.9mg % Jan 2013 - hgb is 11.7gm% April 2013 - CXR is hypperinflation. No infiltrates   REC Please have full PFt breathing test  Please do walk test on room air  CMA will ensure you are uptodate with flu and pneumovax  REturn to see me or NP Tammy after above  At followup we can discuss low dose CT chest for cancer screening and quit    OV  07/24/2012  Followup to discuss  A) wheezing - pft results. See below. Shows moderate obstruction c/w gold stge 2 copd and mixed restriction B) smoking - he says he will quit for sure after he completes current pack. Wife sneers at him with body language that he would not quit smoking. Refused meds C) lung cancer screen - discused. He refused low dose ct due to cost    PFT FVC fev1 ratio BD fev1 TLC DLCO comment  07/24/2012  2.9L/74% 1.7L/66% 58/low% 3% 3.5L/59% 12/61% Cleda Daub - obstruction Lung volume - restriction Diffusion - reduced Summary - mixed picture      CAT COPD Symptom & Quality of Life Score (GSK trademark) 0 is no burden. 5 is highest burden 07/01/2012   Never Cough -> Cough all the time 3  No phlegm in chest -> Chest is full of phlegm 2  No chest tightness -> Chest feels very tight 0  No dyspnea for 1 flight stairs/hill -> Very dyspneic for 1 flight of stairs 1  No limitations for ADL at home -> Very limited with ADL at home 0  Confident leaving home -> Not at all confident leaving home 0  Sleep soundly -> Do not sleep soundly because of lung condition 1  Lots of Energy -> No energy at all 2  TOTAL Score (max 40)       Review of Systems  Constitutional: Negative for fever and unexpected weight change.  HENT: Negative for ear pain, nosebleeds, congestion, sore throat, rhinorrhea, sneezing, trouble swallowing, dental problem, postnasal drip and sinus pressure.   Eyes: Negative for redness and itching.  Respiratory: Negative for cough, chest tightness, shortness of breath and wheezing.   Cardiovascular: Negative for palpitations and leg swelling.  Gastrointestinal: Negative for nausea and vomiting.  Genitourinary: Negative for dysuria.  Musculoskeletal: Negative for joint swelling.  Skin: Negative for rash.  Neurological: Negative for headaches.  Hematological: Does not bruise/bleed easily.  Psychiatric/Behavioral: Negative for dysphoric mood. The patient is  not nervous/anxious.        Objective:   Physical Exam Nursing note and vitals reviewed. Constitutional: He is oriented to person, place, and time. He appears well-developed and well-nourished. No distress.  HENT:  Head: Normocephalic and atraumatic.  Right Ear: External ear normal.  Left Ear: External ear normal.  Mouth/Throat: Oropharynx is clear and moist. No oropharyngeal exudate.  Partially edentulous  Eyes: Conjunctivae normal and EOM are normal. Pupils are equal, round, and reactive to light. Right eye exhibits no discharge. Left eye exhibits no discharge. No scleral icterus.  Neck: Normal range of motion. Neck supple. No JVD present. No tracheal deviation present. No thyromegaly present.  Cardiovascular: Normal rate, regular rhythm and intact distal pulses.  Exam reveals no gallop and no friction rub.   No murmur heard. Pulmonary/Chest: Effort normal. No respiratory distress. He has no wheezes. He has rales. He exhibits no tenderness.       Mild barrel chest Crackles at base +  Abdominal: Soft. Bowel sounds are normal. He exhibits no distension and no mass. There is no tenderness. There is no rebound and no guarding.  Musculoskeletal: Normal range of motion. He exhibits no edema and no tenderness.  Lymphadenopathy:    He has no cervical adenopathy.  Neurological: He is alert and oriented to person, place, and time. He has normal reflexes. No cranial nerve deficit. Coordination normal.  Skin: Skin is warm and dry. No rash noted. He is not diaphoretic. No erythema. No pallor.  Psychiatric: He has a normal mood and affect. His behavior is normal. Judgment and thought content normal.         Assessment & Plan:

## 2012-07-24 NOTE — Assessment & Plan Note (Signed)
Discussed screening Ct chest for early detection of lung cancer Explained that in age 75-75 and smoking history, annual low dose CT chest can pick up lung cancer early and has potential to save lives and cure lung cancer This is similar to screening mammogram, colonoscopies and pap smears Explained Ct scan is low dose radiation Explained early lung cancer is curable but asymptomatic and best way to detect is CT scan Explained CT superior to CXR Explained that false positives are present and can incur cost and workup like biopsies, additional scan but benefit outweighs risk Explained currently out of pocket $300   Based on above, he refused due to cost issue

## 2012-07-24 NOTE — Patient Instructions (Addendum)
#  Wheezing and copd  - the reason you are wheezing is that you have copd otherwise called emphysema or chronic bronchitis - Please start spiriva 1 puff daily - take sample, script and show technique - at fu wil consider CT chest for ILD  #Smoking - smoking is the reason why you have copd - as discussed I respect your decision to refuse medications to help quit smoking  - I trust you will quit smoking by yourself after using the current pack  #Lung cancer screening  - we discussed ct scan for this and respect your decision to not have it for now due to lack of money  #Followup  2 months Walk test and CAT score at followup

## 2012-07-24 NOTE — Assessment & Plan Note (Signed)
PFts are c/w copd but crackles at base on TLC is low that suggests co-existsent ILD. He is very slow and stubborn personality. 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 At followup will consider CT. Currently he refused low dose CT for lung cancer screen

## 2012-07-24 NOTE — Progress Notes (Signed)
PFT done today. 

## 2012-07-24 NOTE — Assessment & Plan Note (Signed)
The patient was counseled on the dangers of tobacco use, and was advised to quit and he refused meds.  Reviewed strategies to maximize success, including removing cigarettes and smoking materials from environment. He refused medications. He says he will quit after current pack. Wife not sure he will quit. We will see at followup  3 min face to face conversation

## 2012-08-01 ENCOUNTER — Other Ambulatory Visit (HOSPITAL_COMMUNITY): Payer: Self-pay | Admitting: Urology

## 2012-08-01 DIAGNOSIS — C61 Malignant neoplasm of prostate: Secondary | ICD-10-CM

## 2012-08-19 ENCOUNTER — Encounter (HOSPITAL_COMMUNITY)
Admission: RE | Admit: 2012-08-19 | Discharge: 2012-08-19 | Disposition: A | Discharge status: Home / Self Care | Payer: Medicare Other | Source: Ambulatory Visit | Attending: Urology | Admitting: Urology

## 2012-08-19 ENCOUNTER — Encounter (HOSPITAL_COMMUNITY): Payer: Self-pay

## 2012-08-19 DIAGNOSIS — C61 Malignant neoplasm of prostate: Secondary | ICD-10-CM | POA: Insufficient documentation

## 2012-08-19 DIAGNOSIS — M25569 Pain in unspecified knee: Secondary | ICD-10-CM | POA: Insufficient documentation

## 2012-08-19 MED ORDER — TECHNETIUM TC 99M MEDRONATE IV KIT
25.0000 | PACK | Freq: Once | INTRAVENOUS | Status: AC | PRN
  Administered 2012-08-19: 25 via INTRAVENOUS

## 2012-09-23 ENCOUNTER — Ambulatory Visit (INDEPENDENT_AMBULATORY_CARE_PROVIDER_SITE_OTHER): Payer: Medicare Other | Admitting: Internal Medicine

## 2012-09-23 ENCOUNTER — Encounter: Payer: Self-pay | Admitting: Internal Medicine

## 2012-09-23 VITALS — BP 108/62 | HR 87 | Temp 98.1°F | Ht 68.0 in | Wt 188.6 lb

## 2012-09-23 DIAGNOSIS — R040 Epistaxis: Secondary | ICD-10-CM

## 2012-09-23 DIAGNOSIS — J8410 Pulmonary fibrosis, unspecified: Secondary | ICD-10-CM

## 2012-09-23 DIAGNOSIS — J4489 Other specified chronic obstructive pulmonary disease: Secondary | ICD-10-CM

## 2012-09-23 DIAGNOSIS — J849 Interstitial pulmonary disease, unspecified: Secondary | ICD-10-CM

## 2012-09-23 DIAGNOSIS — J449 Chronic obstructive pulmonary disease, unspecified: Secondary | ICD-10-CM

## 2012-09-23 DIAGNOSIS — F17200 Nicotine dependence, unspecified, uncomplicated: Secondary | ICD-10-CM

## 2012-09-23 MED ORDER — BUDESONIDE-FORMOTEROL FUMARATE 160-4.5 MCG/ACT IN AERO: 2.0000 | INHALATION_SPRAY | Freq: Two times a day (BID) | RESPIRATORY_TRACT | Status: AC

## 2012-09-23 MED ORDER — TIOTROPIUM BROMIDE MONOHYDRATE 18 MCG IN CAPS: 18.0000 ug | ORAL_CAPSULE | Freq: Every day | RESPIRATORY_TRACT | Status: DC

## 2012-09-23 NOTE — Progress Notes (Signed)
Subjective:    Patient ID: Mitchell Wood, male    DOB: July 27, 1935, 78 y.o.   MRN: [id number]  HPI PCP is Alva Garnet., MD Body mass index is 28.74 kg/(m^2).  reports that he has been smoking Cigarettes.  He has a 50 pack-year smoking history.    IOV 07/01/2012 78 year old AA male.  50 pack current smoker. Referred per him "check my chest out". He is poor historian. Wife supplementing hx. Wife says he is a typical male who will not go to MD, do tests, follow instructions but she is going to be strict with him and ensures he follows directions  C/o Wheezing x years. Insidious onset. On and off. Triggered by dust, cigarettes (still smokes). Improved by no specific factors but wife recollects 3 years ago some inhaler might have helped and 3 weeks ago was given symbicort which he thinks helps. He is using symbicort past week. Severity fluctutates between mild and moderate. Symptoms stable over time. There is associated dry cough daily morning that is improved by getting fresh air. 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 Trying to quit he says by praying or thinking about it. He says he can go for several hours wihtout smoking. If he is not busy he will smoke     LAbs  2010 creat is 0.9mg % Jan 2013 - hgb is 11.7gm% April 2013 - CXR is hypperinflation. No infiltrates   REC Please have full PFt breathing test  Please do walk test on room air  CMA will ensure you are uptodate with flu and pneumovax  REturn to see me or NP Tammy after above  At followup we can discuss low dose CT chest for cancer screening and quit    OV  07/24/2012  Followup to discuss  A) wheezing - pft results. See below. Shows moderate obstruction c/w gold stge 2 copd and mixed restriction B) smoking - he says he will quit for sure after he completes current pack. Wife sneers at him with body language that he would not quit smoking. Refused meds C) lung cancer screen - discused. He refused low dose ct due to cost    #Wheezing and copd  - the reason you are wheezing is that you have copd otherwise called emphysema or chronic bronchitis  - Please start spiriva 1 puff daily - take sample, script and show technique  - at fu wil consider CT chest for ILD  #Smoking  - smoking is the reason why you have copd  - as discussed I respect your decision to refuse medications to help quit smoking  - I trust you will quit smoking by yourself after using the current pack  #Lung cancer screening  - we discussed ct scan for this and respect your decision to not have it for now due to lack of money  #Followup  2 months  Walk test and CAT score at followup     OV 09/23/2012   Followup for COPD, smoking, and abnormal PFTs ( mixed picture of obstruction and restriction)  - At last visit  was started on spriiva. With this he thinks he is he is better. He might be on Symbicort according to her medication list but he denies this. Overall he and wife reports debility and pulmonary health. He continues to go to the gym but refuses referral to pulmonary rehabilitation. His COPD CAD score is actually worse at 14.5 compared to 9 at the last visit but he is a very poor historian am not sure I can rely on his course. Clinically seems to COPD stable but am unable to determine if his inhalers are helping him but I suspect so  - Smoking: He continues to smoke despite promising  last visit he would quit. He says he's cut down smoking and he can go several hours without smoking  - New issue: He has chronic sinus drainage all his life and when I recommended sinus  inhalers wife and he reported history of chronic epistaxis for over 25 years. Apparently he has had one or 2 admissions to the emergency room with normal blood pressure but severe epistaxis requiring packing. He was on Coumadin at some point for a to fibrillation but they report epistaxis is unrelated to Coumadin. He is only on aspirin prophylactic dose. He has epistaxis at least 1-2 times a week most recently a few days ago. He has no specialist following him for this issue  PFT FVC fev1 ratio BD fev1 TLC DLCO comment  07/24/2012  2.9L/74% 1.7L/66% 58/low% 3% 3.5L/59% 12/61% Cleda Daub - obstruction Lung volume - restriction Diffusion - reduced Summary - mixed picture      CAT COPD Symptom & Quality of Life Score (GSK trademark) 0 is no burden. 5 is highest burden 07/01/2012  09/23/2012   Never Cough -> Cough all the time 3 3  No phlegm in chest -> Chest is full of phlegm 2 3  No chest tightness -> Chest feels very tight 0 1  No dyspnea for 1 flight stairs/hill -> Very dyspneic for 1 flight of stairs 1 2  No limitations for ADL at home -> Very limited with ADL at home 0 2  Confident leaving home -> Not at all confident leaving home 0 1.5  Sleep soundly -> Do not sleep soundly because of lung condition 1 0  Lots of Energy -> No energy at all 2 2  TOTAL Score (max 40)  9 14.5   Past, Family, Social reviewed: no change since last visit other than fact he will start Lupron injections for prostate cancer through Dr. Isabel Caprice   Past, Family, Social reviewed: Review of Systems  Constitutional: Negative for fever and unexpected weight change.  HENT: Negative for ear pain, nosebleeds, congestion, sore throat, rhinorrhea, sneezing, trouble swallowing, dental problem, postnasal drip and sinus pressure.   Eyes: Negative for redness and itching.  Respiratory: Positive for cough. Negative for chest tightness, shortness of breath and wheezing.   Cardiovascular: Negative for palpitations and leg swelling.   Gastrointestinal: Negative for nausea and vomiting.  Genitourinary: Negative for dysuria.  Musculoskeletal: Negative for joint swelling.  Skin: Negative for rash.  Neurological: Negative for headaches.  Hematological: Does not bruise/bleed easily.  Psychiatric/Behavioral: Negative for dysphoric mood. The patient is not nervous/anxious.   Current outpatient prescriptions:aspirin EC 81 MG tablet, Take 81 mg by mouth daily., Disp: , Rfl: ;  ergocalciferol (VITAMIN D2) 50000 UNITS capsule, Take 50,000 Units by mouth 2 (two) times a week., Disp: , Rfl: ;  fish oil-omega-3 fatty acids 1000 MG capsule, Take 2 g by mouth daily.,  Disp: , Rfl: ;  metoprolol tartrate (LOPRESSOR) 25 MG tablet, Take 12.5 mg by mouth 2 (two) times daily., Disp: , Rfl:  Travoprost, BAK Free, (TRAVATAN) 0.004 % SOLN ophthalmic solution, Place 1 drop into both eyes at bedtime., Disp: , Rfl: ;  budesonide-formoterol (SYMBICORT) 160-4.5 MCG/ACT inhaler, Inhale 2 puffs into the lungs 2 (two) times daily., Disp: , Rfl: ;  tiotropium (SPIRIVA) 18 MCG inhalation capsule, Place 18 mcg into inhaler and inhale daily., Disp: , Rfl:       Objective:   Physical Exam Nursing note and vitals reviewed. Constitutional: He is oriented to person, place, and time. He appears well-developed and well-nourished. No distress.  HENT:  Head: Normocephalic and atraumatic.  Right Ear: External ear normal.  Left Ear: External ear normal.  Mouth/Throat: Oropharynx is clear and moist. No oropharyngeal exudate.  Partially edentulous  Eyes: Conjunctivae normal and EOM are normal. Pupils are equal, round, and reactive to light. Right eye exhibits no discharge. Left eye exhibits no discharge. No scleral icterus.  Neck: Normal range of motion. Neck supple. No JVD present. No tracheal deviation present. No thyromegaly present.  Cardiovascular: Normal rate, regular rhythm and intact distal pulses.  Exam reveals no gallop and no friction rub.   No murmur  heard. Pulmonary/Chest: Effort normal. No respiratory distress. He has no wheezes. He has rales. He exhibits no tenderness.       Mild barrel chest Crackles at base +  Abdominal: Soft. Bowel sounds are normal. He exhibits no distension and no mass. There is no tenderness. There is no rebound and no guarding.  Musculoskeletal: Normal range of motion. He exhibits no edema and no tenderness.  Lymphadenopathy:    He has no cervical adenopathy.  Neurological: He is alert and oriented to person, place, and time. He has normal reflexes. No cranial nerve deficit. Coordination normal.  Skin: Skin is warm and dry. No rash noted. He is not diaphoretic. No erythema. No pallor.  Psychiatric: He has a normal mood and affect. His behavior is normal. Judgment and thought content normal.         Assessment & Plan:

## 2012-09-23 NOTE — Patient Instructions (Addendum)
#  Sinus drainage and nose bleeds  - see ENT doctor  - do not start nasal meds till you see them  #COPD  - stable - continue spiriva  - are you on symbicort ? Nurse will confirm. If not, we will address this next visit. If so, continue - contiunue exercises at the gym. At some point in the future will refer you to pulmonary rehabilitation  #Shortness of breath with abnormal breathing test - Do cat scan of the chest to rule out scarring process in the lung  #Smoking  - As discussed please work on quitting smoking  #Followup - 3 months or sooner if needed - Will call with CAT scan of the chest result -

## 2012-09-25 DIAGNOSIS — J849 Interstitial pulmonary disease, unspecified: Secondary | ICD-10-CM | POA: Insufficient documentation

## 2012-09-25 DIAGNOSIS — R040 Epistaxis: Secondary | ICD-10-CM | POA: Insufficient documentation

## 2012-09-25 NOTE — Assessment & Plan Note (Signed)
#  Sinus drainage and nose bleeds  - see ENT doctor  - do not start nasal meds till you see them

## 2012-09-25 NOTE — Assessment & Plan Note (Signed)
PFT shows mixed obstruction and restriction. Exam has crackles. Dyspnea is not fully improved despite bronchodilators triple therapy. Therefore we'll get CT scan of the chest rule out interstitial lung disease

## 2012-09-25 NOTE — Assessment & Plan Note (Signed)
Was very confusing if he is on any inhaler at all. Finally realized that he is on both Spiriva and Symbicort. I have advised him to continue both. At followup will discuss pulmonary rehabilitation although he states he is going to the [facility] right now

## 2012-09-25 NOTE — Assessment & Plan Note (Signed)
Advised again to quit. He says he is on several hours without smoking and that is the best he can do. He understands and needs to quit. It'll be a challenging problem to have him quit seems very resistant and has poor understanding of medical issues  3 minutes spent counseling face-to-face

## 2012-09-27 ENCOUNTER — Ambulatory Visit (INDEPENDENT_AMBULATORY_CARE_PROVIDER_SITE_OTHER)
Admission: RE | Admit: 2012-09-27 | Discharge: 2012-09-27 | Disposition: A | Discharge status: Home / Self Care | Payer: Medicare Other | Source: Ambulatory Visit | Attending: Internal Medicine | Admitting: Internal Medicine

## 2012-09-27 DIAGNOSIS — J8410 Pulmonary fibrosis, unspecified: Secondary | ICD-10-CM

## 2012-09-27 DIAGNOSIS — R040 Epistaxis: Secondary | ICD-10-CM

## 2012-09-27 DIAGNOSIS — J849 Interstitial pulmonary disease, unspecified: Secondary | ICD-10-CM

## 2012-10-02 ENCOUNTER — Telehealth: Payer: Self-pay | Admitting: Internal Medicine

## 2012-10-02 DIAGNOSIS — R0600 Dyspnea, unspecified: Secondary | ICD-10-CM

## 2012-10-02 DIAGNOSIS — I2510 Atherosclerotic heart disease of native coronary artery without angina pectoris: Secondary | ICD-10-CM

## 2012-10-02 NOTE — Telephone Encounter (Signed)
Ct chest does not show scarring process or lung cancer  - good news  However, blood vessels of heart have calcium on it. He might have heart vessel blockage. Please refer to Dr Jacinto Halim (ordered) and tell him that; he is a Dr Selena Batten patient who refers to Dr Jacinto Halim  Thanks  MR

## 2012-10-07 NOTE — Telephone Encounter (Signed)
LMTCBx1.Jennifer Castillo, CMA  

## 2012-10-08 ENCOUNTER — Other Ambulatory Visit: Payer: Self-pay | Admitting: Cardiovascular Disease

## 2012-10-08 ENCOUNTER — Ambulatory Visit
Admission: RE | Admit: 2012-10-08 | Discharge: 2012-10-08 | Disposition: A | Discharge status: Home / Self Care | Payer: Medicare Other | Source: Ambulatory Visit | Attending: Cardiovascular Disease | Admitting: Cardiovascular Disease

## 2012-10-08 DIAGNOSIS — Z01818 Encounter for other preprocedural examination: Secondary | ICD-10-CM

## 2012-10-08 NOTE — Telephone Encounter (Signed)
Wife returning call.  [phone]

## 2012-10-08 NOTE — Telephone Encounter (Signed)
LMTCBx2. Onna Nodal, CMA  

## 2012-10-08 NOTE — Telephone Encounter (Signed)
lmtcb x1 

## 2012-10-09 ENCOUNTER — Other Ambulatory Visit: Payer: Self-pay | Admitting: *Deleted

## 2012-10-11 ENCOUNTER — Encounter (HOSPITAL_COMMUNITY): Payer: Self-pay | Admitting: Pharmacy Technician

## 2012-10-14 ENCOUNTER — Encounter (HOSPITAL_COMMUNITY)
Admission: RE | Disposition: A | Discharge status: Home / Self Care | Payer: Self-pay | Source: Ambulatory Visit | Attending: Cardiovascular Disease

## 2012-10-14 ENCOUNTER — Ambulatory Visit (HOSPITAL_COMMUNITY)
Admission: RE | Admit: 2012-10-14 | Discharge: 2012-10-15 | Disposition: A | Discharge status: Home / Self Care | Payer: Medicare Other | Source: Ambulatory Visit | Attending: Cardiovascular Disease | Admitting: Cardiovascular Disease

## 2012-10-14 DIAGNOSIS — I351 Nonrheumatic aortic (valve) insufficiency: Secondary | ICD-10-CM | POA: Diagnosis present

## 2012-10-14 DIAGNOSIS — I209 Angina pectoris, unspecified: Secondary | ICD-10-CM | POA: Insufficient documentation

## 2012-10-14 DIAGNOSIS — I739 Peripheral vascular disease, unspecified: Secondary | ICD-10-CM

## 2012-10-14 DIAGNOSIS — F17200 Nicotine dependence, unspecified, uncomplicated: Secondary | ICD-10-CM | POA: Insufficient documentation

## 2012-10-14 DIAGNOSIS — I2510 Atherosclerotic heart disease of native coronary artery without angina pectoris: Secondary | ICD-10-CM | POA: Diagnosis present

## 2012-10-14 DIAGNOSIS — J449 Chronic obstructive pulmonary disease, unspecified: Secondary | ICD-10-CM | POA: Diagnosis present

## 2012-10-14 DIAGNOSIS — J849 Interstitial pulmonary disease, unspecified: Secondary | ICD-10-CM | POA: Diagnosis present

## 2012-10-14 DIAGNOSIS — I701 Atherosclerosis of renal artery: Secondary | ICD-10-CM | POA: Insufficient documentation

## 2012-10-14 DIAGNOSIS — J4489 Other specified chronic obstructive pulmonary disease: Secondary | ICD-10-CM | POA: Insufficient documentation

## 2012-10-14 DIAGNOSIS — I4510 Unspecified right bundle-branch block: Secondary | ICD-10-CM | POA: Diagnosis present

## 2012-10-14 DIAGNOSIS — I2089 Other forms of angina pectoris: Secondary | ICD-10-CM | POA: Diagnosis present

## 2012-10-14 DIAGNOSIS — R931 Abnormal findings on diagnostic imaging of heart and coronary circulation: Secondary | ICD-10-CM | POA: Diagnosis present

## 2012-10-14 DIAGNOSIS — I4891 Unspecified atrial fibrillation: Secondary | ICD-10-CM | POA: Insufficient documentation

## 2012-10-14 DIAGNOSIS — C61 Malignant neoplasm of prostate: Secondary | ICD-10-CM | POA: Diagnosis present

## 2012-10-14 DIAGNOSIS — I2584 Coronary atherosclerosis due to calcified coronary lesion: Secondary | ICD-10-CM | POA: Insufficient documentation

## 2012-10-14 DIAGNOSIS — I208 Other forms of angina pectoris: Secondary | ICD-10-CM | POA: Diagnosis present

## 2012-10-14 DIAGNOSIS — I482 Chronic atrial fibrillation, unspecified: Secondary | ICD-10-CM | POA: Diagnosis present

## 2012-10-14 DIAGNOSIS — I447 Left bundle-branch block, unspecified: Secondary | ICD-10-CM | POA: Insufficient documentation

## 2012-10-14 HISTORY — DX: Atherosclerotic heart disease of native coronary artery without angina pectoris: I25.10

## 2012-10-14 HISTORY — DX: Peripheral vascular disease, unspecified: I73.9

## 2012-10-14 HISTORY — PX: CARDIAC CATHETERIZATION: SHX172

## 2012-10-14 HISTORY — PX: LEFT HEART CATHETERIZATION WITH CORONARY ANGIOGRAM: SHX5451

## 2012-10-14 LAB — POCT ACTIVATED CLOTTING TIME
Activated Clotting Time: 426 seconds
Activated Clotting Time: 192 seconds
Activated Clotting Time: 170 seconds

## 2012-10-14 SURGERY — LEFT HEART CATHETERIZATION WITH CORONARY ANGIOGRAM
Anesthesia: LOCAL

## 2012-10-14 MED ORDER — ISOSORBIDE MONONITRATE ER 30 MG PO TB24
30.0000 mg | ORAL_TABLET | Freq: Every day | ORAL | Status: DC
  Administered 2012-10-15: 10:00:00 30 mg via ORAL
  Filled 2012-10-14: qty 1

## 2012-10-14 MED ORDER — ASPIRIN 81 MG PO CHEW: 81.0000 mg | CHEWABLE_TABLET | Freq: Every day | ORAL | Status: DC

## 2012-10-14 MED ORDER — ONDANSETRON HCL 4 MG/2ML IJ SOLN: 4.0000 mg | Freq: Four times a day (QID) | INTRAMUSCULAR | Status: DC | PRN

## 2012-10-14 MED ORDER — ASPIRIN 81 MG PO CHEW
CHEWABLE_TABLET | ORAL | Status: AC
  Filled 2012-10-14: qty 4

## 2012-10-14 MED ORDER — DIAZEPAM 5 MG PO TABS
5.0000 mg | ORAL_TABLET | ORAL | Status: AC
  Administered 2012-10-14: 5 mg via ORAL

## 2012-10-14 MED ORDER — BUDESONIDE-FORMOTEROL FUMARATE 160-4.5 MCG/ACT IN AERO
2.0000 | INHALATION_SPRAY | Freq: Two times a day (BID) | RESPIRATORY_TRACT | Status: DC
  Administered 2012-10-14 – 2012-10-15 (×2): 2 via RESPIRATORY_TRACT
  Filled 2012-10-14: qty 6

## 2012-10-14 MED ORDER — NITROGLYCERIN 0.4 MG SL SUBL: 0.4000 mg | SUBLINGUAL_TABLET | SUBLINGUAL | Status: DC | PRN

## 2012-10-14 MED ORDER — SALINE SPRAY 0.65 % NA SOLN: 1.0000 | Freq: Every day | NASAL | Status: DC | PRN

## 2012-10-14 MED ORDER — SODIUM CHLORIDE 0.9 % IV SOLN
INTRAVENOUS | Status: AC
  Administered 2012-10-14: 16:00:00 via INTRAVENOUS

## 2012-10-14 MED ORDER — ALPRAZOLAM 0.25 MG PO TABS: 0.2500 mg | ORAL_TABLET | Freq: Three times a day (TID) | ORAL | Status: DC | PRN

## 2012-10-14 MED ORDER — MORPHINE SULFATE 2 MG/ML IJ SOLN: 2.0000 mg | INTRAMUSCULAR | Status: DC | PRN

## 2012-10-14 MED ORDER — HEPARIN (PORCINE) IN NACL 2-0.9 UNIT/ML-% IJ SOLN
INTRAMUSCULAR | Status: AC
  Filled 2012-10-14: qty 2000

## 2012-10-14 MED ORDER — ACETAMINOPHEN 325 MG PO TABS: 650.0000 mg | ORAL_TABLET | ORAL | Status: DC | PRN

## 2012-10-14 MED ORDER — METOPROLOL TARTRATE 12.5 MG HALF TABLET
12.5000 mg | ORAL_TABLET | Freq: Two times a day (BID) | ORAL | Status: DC
  Administered 2012-10-14 – 2012-10-15 (×2): 12.5 mg via ORAL
  Filled 2012-10-14 (×4): qty 1

## 2012-10-14 MED ORDER — LIDOCAINE HCL (PF) 1 % IJ SOLN
INTRAMUSCULAR | Status: AC
  Filled 2012-10-14: qty 30

## 2012-10-14 MED ORDER — TRAVOPROST (BAK FREE) 0.004 % OP SOLN
1.0000 [drp] | Freq: Every day | OPHTHALMIC | Status: DC
  Administered 2012-10-14: 1 [drp] via OPHTHALMIC
  Filled 2012-10-14: qty 2.5

## 2012-10-14 MED ORDER — ASPIRIN EC 81 MG PO TBEC
81.0000 mg | DELAYED_RELEASE_TABLET | Freq: Every day | ORAL | Status: DC
  Administered 2012-10-15: 10:00:00 81 mg via ORAL
  Filled 2012-10-14: qty 1

## 2012-10-14 MED ORDER — TRAMADOL HCL 50 MG PO TABS: 50.0000 mg | ORAL_TABLET | Freq: Four times a day (QID) | ORAL | Status: DC | PRN

## 2012-10-14 MED ORDER — TIOTROPIUM BROMIDE MONOHYDRATE 18 MCG IN CAPS
18.0000 ug | ORAL_CAPSULE | RESPIRATORY_TRACT | Status: DC
  Administered 2012-10-15: 18 ug via RESPIRATORY_TRACT
  Filled 2012-10-14: qty 5

## 2012-10-14 MED ORDER — SODIUM CHLORIDE 0.9 % IJ SOLN: 3.0000 mL | INTRAMUSCULAR | Status: DC | PRN

## 2012-10-14 MED ORDER — ZOLPIDEM TARTRATE 5 MG PO TABS: 5.0000 mg | ORAL_TABLET | Freq: Every evening | ORAL | Status: DC | PRN

## 2012-10-14 MED ORDER — DIAZEPAM 5 MG PO TABS
ORAL_TABLET | ORAL | Status: AC
  Filled 2012-10-14: qty 1

## 2012-10-14 MED ORDER — BIVALIRUDIN 250 MG IV SOLR
INTRAVENOUS | Status: AC
  Filled 2012-10-14: qty 250

## 2012-10-14 MED ORDER — SODIUM CHLORIDE 0.9 % IV SOLN
INTRAVENOUS | Status: DC
  Administered 2012-10-14: 14:00:00 via INTRAVENOUS

## 2012-10-14 MED ORDER — ADENOSINE 12 MG/4ML IV SOLN
16.0000 mL | Freq: Once | INTRAVENOUS | Status: DC
  Filled 2012-10-14: qty 16

## 2012-10-14 NOTE — H&P (Signed)
  H & P will be scanned in.  Pt was reexamined and existing H & P reviewed. No changes found.  Runell Gess, MD [facility] 10/14/2012 3:28 PM

## 2012-10-14 NOTE — CV Procedure (Signed)
Mitchell Wood is a 78 y.o. male    [id number] LOCATION:  FACILITY: [facility]  PHYSICIAN: Nanetta Batty, M.D. 04/16/1935   DATE OF PROCEDURE:  10/14/2012  DATE OF DISCHARGE:  [facility]  CARDIAC CATHETERIZATION     History obtained from chart review. [name] is a 78 year old African American male who presents for cardiac catheterization because of a chest CT that showed calcium in his coronary arteries and exertional chest pain. She is factors include COPD with smoking. He does have a family history of heart disease. Chronic right bundle branch block and chronic atrial fibrillation.   PROCEDURE DESCRIPTION:    The patient was brought to the second floor [place] [facility]iac cath lab in the postabsorptive state. He was  premedicated with Valium 5 mg by mouth. His right groinwas prepped and shaved in usual sterile fashion. Xylocaine 1% was used  for local anesthesia. A 5 French sheath was inserted into the right common femoral artery using standard Seldinger technique. A 5 French right and left Judkins diagnostic catheters all the 5 French pigtail catheter were used for selective coronary geography, left ventriculography and supravalvular aortography. Visipaque dye was used for the entirety of the case. Retrograde aortic, left ventricular end pullback pressures were recorded.   HEMODYNAMICS:    AO SYSTOLIC/AO DIASTOLIC: 122/76   LV SYSTOLIC/LV DIASTOLIC: 111/28  ANGIOGRAPHIC RESULTS:   1. Left main; normal  2. LAD; calcified in the midportion with a long tubular 50-60% stenosis 3. Left circumflex; nondominant and free of significant disease.  4. Right coronary artery; dominant with minor irregularities 5. Left ventriculography; RAO left ventriculogram was performed using  25 mL of Visipaque dye at 12 mL/second. The overall LVEF estimated  55-60 %  Wi wall motion abnormalities notable for a small area of dyskinesis in the infero-apex 6. Supravalvular  aortography: Supravalvular aortogram was performed using 20 cc of Visipaque dye at 20 cc per second in the LAO view. The descending aorta appeared generous.  IMPRESSION:[name] has a tubular moderate lesion in the mid LAD. We'll proceed with FFR interrogation to determine physiologic significance.  procedure description: the 5 French sheath in the right common femoral artery was exchanged over a wire for a 6 [place] sheath. The patient received Intermedics bolus with an ACT of 426. A total of 150 cc of contrast was used during the case. Using a 6 [place] XB LAD 4.0 cm curved guide catheter: FFR wire FFR was informed. Adenosine infused intravenously for 2 minutes and the lowest FFR document was 0.86 suggesting that the lesion was not physiologically significant.  Overall impression: Noncritical CAD with preserved LV function. He does have a generous descending thoracic aorta and will probably require a CT angiogram to further quantify this. The guidewire and catheter were removed. The sheath was then secured in place. The patient left the Cath Lab in stable condition. He will be Gently gently hydrated overnight and discharged home in the morning.  Runell Gess MD, [facility] 10/14/2012 4:29 PM

## 2012-10-14 NOTE — Progress Notes (Signed)
Site area: right groin  Site Prior to Removal:  Level 0  Pressure Applied For 20 MINUTES    Minutes Beginning at 2035  Manual:   yes  Patient Status During Pull:  Tolerated well  Post Pull Groin Site:  Level 0  Post Pull Instructions Given:  yes  Post Pull Pulses Present:  yes  Dressing Applied:  yes  Comments:  Sheath removed after ACT 170.  See frequent vs.

## 2012-10-15 ENCOUNTER — Encounter (HOSPITAL_COMMUNITY): Payer: Self-pay | Admitting: *Deleted

## 2012-10-15 DIAGNOSIS — C61 Malignant neoplasm of prostate: Secondary | ICD-10-CM | POA: Diagnosis present

## 2012-10-15 DIAGNOSIS — I2089 Other forms of angina pectoris: Secondary | ICD-10-CM | POA: Diagnosis present

## 2012-10-15 DIAGNOSIS — I2510 Atherosclerotic heart disease of native coronary artery without angina pectoris: Secondary | ICD-10-CM | POA: Diagnosis present

## 2012-10-15 DIAGNOSIS — R931 Abnormal findings on diagnostic imaging of heart and coronary circulation: Secondary | ICD-10-CM | POA: Diagnosis present

## 2012-10-15 DIAGNOSIS — I482 Chronic atrial fibrillation, unspecified: Secondary | ICD-10-CM | POA: Diagnosis present

## 2012-10-15 DIAGNOSIS — I208 Other forms of angina pectoris: Secondary | ICD-10-CM | POA: Diagnosis present

## 2012-10-15 DIAGNOSIS — I4510 Unspecified right bundle-branch block: Secondary | ICD-10-CM | POA: Diagnosis present

## 2012-10-15 DIAGNOSIS — I351 Nonrheumatic aortic (valve) insufficiency: Secondary | ICD-10-CM | POA: Diagnosis present

## 2012-10-15 MED ORDER — METOPROLOL TARTRATE 25 MG PO TABS: 25.0000 mg | ORAL_TABLET | Freq: Two times a day (BID) | ORAL | Status: DC

## 2012-10-15 MED ORDER — METOPROLOL TARTRATE 50 MG PO TABS
50.0000 mg | ORAL_TABLET | Freq: Two times a day (BID) | ORAL | Status: DC
  Filled 2012-10-15: qty 1

## 2012-10-15 MED ORDER — ACETAMINOPHEN 325 MG PO TABS: 650.0000 mg | ORAL_TABLET | ORAL | Status: AC | PRN

## 2012-10-15 MED FILL — Dextrose Inj 5%: INTRAVENOUS | Qty: 50 | Status: AC

## 2012-10-15 NOTE — Discharge Summary (Signed)
Patient ID: Mitchell Wood,  MRN: [id number], DOB/AGE: 76-Aug-1936 76 y.o.  Admit date: 10/14/2012 Discharge date: 10/15/2012  Primary Care Provider: Dr Kellie Shropshire Primary Cardiologist: Dr Allyson Sabal  Discharge Diagnoses Principal Problem:   Angina effort, cath 10/14/12 Active Problems:   Smoker   ILD (interstitial lung disease)   CAD (coronary artery disease) 50-60% non critical LAD 10/14/12   Chronic atrial fibrillation, not on Coumadin secondary to history of GI bleeding   COPD (chronic obstructive pulmonary disease)   Abnormal nuclear cardiac imaging test   RBBB   Aortic insufficiency, moderate   Prostate cancer history    Procedures: coronary angiogram 10/14/12   Hospital Course :  The patient is a pleasant, mildly demented, 78 year old male who has been followed by Dr. Allyson Sabal in the past for atrial fibrillation, which is now chronic. He had been on Coumadin for a time but this had been stopped back in the fall because of some recurrent GI bleeding. He has seen Dr. Charna Elizabeth in the past. He tolerates his atrial fibrillation well. He does have moderate AI by echocardiogram and aortic valve sclerosis. He has a right bundle branch block. The patient's wife, says that he had a catheterization 10 years ago at [facility] but I can find no records of this. He did have a Myoview which was mildly abnormal in August 2010 with some anterior ischemia. At that time the patient was not having symptoms and he was treated medically. Recently he has been referred to Dr. Marchelle Gearing for evaluation of COPD. A CT scan of his chest on September 27, 2012, reveals coronary calcification in the left main as well as RCA, circumflex, and LAD. On interview the patient does admit to some exertional chest tightness. He usually gets this when he is at the gym doing exercises using his arms. He is somewhat of a difficult historian and he seems to have possibly some mild dementia as he was unclear on dates and his wife corrected him  throughout the interview. The pt was seen as an OP 10/08/12 and set up for an OP cath. This was done 10/14/12 by Dr Allyson Sabal. This revealed noncritical CAD with preserved LV function. He does have a generous descending thoracic aorta and will probably require a CT angiogram to further quantify this. The pt is stable for discharge 10/15/12. Low dose statin to be order as an op.    Discharge Vitals:  Blood pressure 121/69, pulse 75, temperature 98.2 F (36.8 C), temperature source Oral, resp. rate 18, height 5\' 10"  (1.778 m), weight 83.9 kg (184 lb 15.5 oz), SpO2 94.00%.    Labs: Results for orders placed during the hospital encounter of 10/14/12 (from the past 48 hour(s))  POCT ACTIVATED CLOTTING TIME     Status: None   Collection Time    10/14/12  3:55 PM      Result Value Range   Activated Clotting Time 426    POCT ACTIVATED CLOTTING TIME     Status: None   Collection Time    10/14/12  6:16 PM      Result Value Range   Activated Clotting Time 192    POCT ACTIVATED CLOTTING TIME     Status: None   Collection Time    10/14/12  7:55 PM      Result Value Range   Activated Clotting Time 170      Disposition:      Follow-up Information   Follow up with Abelino Derrick, PA On 10/22/2012. (9:15)  Contact information:   [address] [place] [address] [phone]       Discharge Medications:    Medication List    TAKE these medications       acetaminophen 325 MG tablet  Commonly known as:  TYLENOL  Take 2 tablets (650 mg total) by mouth every 4 (four) hours as needed.     aspirin EC 81 MG tablet  Take 81 mg by mouth daily.     budesonide-formoterol 160-4.5 MCG/ACT inhaler  Commonly known as:  SYMBICORT  Inhale 2 puffs into the lungs 2 (two) times daily.     ergocalciferol 50000 UNITS capsule  Commonly known as:  VITAMIN D2  Take 50,000 Units by mouth 2 (two) times a week.     fish oil-omega-3 fatty acids 1000 MG capsule  Take 2 g by mouth daily.      isosorbide mononitrate 30 MG 24 hr tablet  Commonly known as:  IMDUR  Take 30 mg by mouth daily.     metoprolol tartrate 25 MG tablet  Commonly known as:  LOPRESSOR  Take 1 tablet (25 mg total) by mouth 2 (two) times daily.     nitroGLYCERIN 0.4 MG SL tablet  Commonly known as:  NITROSTAT  Place 0.4 mg under the tongue every 5 (five) minutes as needed for chest pain.     sodium chloride 0.65 % Soln nasal spray  Commonly known as:  OCEAN  Place 1 spray into the nose daily as needed (For nosebleeds.).     tiotropium 18 MCG inhalation capsule  Commonly known as:  SPIRIVA  Place 18 mcg into inhaler and inhale daily.     Travoprost (BAK Free) 0.004 % Soln ophthalmic solution  Commonly known as:  TRAVATAN  Place 1 drop into both eyes at bedtime.         Duration of Discharge Encounter: Greater than 30 minutes including physician time.  Jolene Provost PA-C 10/15/2012 1:44 PM

## 2012-10-15 NOTE — Progress Notes (Signed)
Utilization Review Completed Tomothy Eddins J. Amaliya Whitelaw, RN, BSN, NCM [phone]  

## 2012-10-15 NOTE — Telephone Encounter (Signed)
LMTCBx3. Jurgen Groeneveld, CMA  

## 2012-10-15 NOTE — Progress Notes (Signed)
Subjective:  Awake and alert. He appears to be mildly demented- see my office note as well.  Objective:  Vital Signs in the last 24 hours: Temp:  [97.6 F (36.4 C)-98.4 F (36.9 C)] 98.2 F (36.8 C) (02/18 0816) Pulse Rate:  [60-113] 75 (02/18 0816) Resp:  [16-18] 18 (02/18 0816) BP: (96-162)/(49-97) 121/69 mmHg (02/18 0816) SpO2:  [89 %-98 %] 94 % (02/18 0842) Weight:  [83.462 kg (184 lb)-83.9 kg (184 lb 15.5 oz)] 83.9 kg (184 lb 15.5 oz) (02/18 0507)  Intake/Output from previous day:  Intake/Output Summary (Last 24 hours) at 10/15/12 0954 Last data filed at 10/15/12 0900  Gross per 24 hour  Intake   1365 ml  Output    700 ml  Net    665 ml   . aspirin EC  81 mg Oral Daily  . budesonide-formoterol  2 puff Inhalation BID  . isosorbide mononitrate  30 mg Oral Daily  . metoprolol tartrate  25 mg Oral BID  . tiotropium  18 mcg Inhalation Q24H  . Travoprost (BAK Free)  1 drop Both Eyes QHS   Physical Exam: General appearance: alert, cooperative and no distress Lungs: clear to auscultation bilaterally Heart: irregularly irregular rhythm Rt groin without hematoma   Rate: 80-140  Rhythm: atrial fibrillation  Lab Results: No results found for this basename: WBC, HGB, PLT,  in the last 72 hours No results found for this basename: NA, K, CL, CO2, GLUCOSE, BUN, CREATININE,  in the last 72 hours No results found for this basename: TROPONINI, CK, MB,  in the last 72 hours Hepatic Function Panel No results found for this basename: PROT, ALBUMIN, AST, ALT, ALKPHOS, BILITOT, BILIDIR, IBILI,  in the last 72 hours No results found for this basename: CHOL,  in the last 72 hours No results found for this basename: INR,  in the last 72 hours  Imaging: Imaging results have been reviewed  Cardiac Studies:  Assessment/Plan:   Principal Problem:   Angina effort, cath 10/14/12 Active Problems:   Smoker   ILD (interstitial lung disease)   CAD (coronary artery disease) 50-60% non  critical LAD 10/14/12   Chronic atrial fibrillation, not on Coumadin secondary to history of GI bleeding   COPD (chronic obstructive pulmonary disease)   Abnormal nuclear cardiac imaging test   RBBB   Aortic insufficiency, moderate   Prostate cancer history   Plan- He needs better rate control, increase beta blocker.Add low dose statin (LDL was 61 on 4/13). Continue ASA and Nitrate. Will defer to Dr Allyson Sabal timing of CTA of his chest to evaluate "generous" thoracic aorta.   Corine Shelter PA-C 10/15/2012, 9:54 AM    Patient seen and examined. Agree with assessment and plan. No chest pain. 50 - 60% LAD lesion; with apical wall motiona abnl and AF  Rate in 90's will increase metoprolol to 50 mg bid. OK to DC today.   Lennette Bihari, MD, [facility] 10/15/2012 1:32 PM

## 2012-10-22 ENCOUNTER — Other Ambulatory Visit: Payer: Self-pay | Admitting: Cardiovascular Disease

## 2012-10-22 ENCOUNTER — Encounter (HOSPITAL_COMMUNITY): Payer: Self-pay | Admitting: Cardiology

## 2012-10-22 DIAGNOSIS — I77810 Thoracic aortic ectasia: Secondary | ICD-10-CM

## 2012-10-22 NOTE — Telephone Encounter (Signed)
Pt aware and he saw already followed with Dr. Allyson Sabal.Carron Curie, CMA

## 2012-10-29 ENCOUNTER — Ambulatory Visit
Admission: RE | Admit: 2012-10-29 | Discharge: 2012-10-29 | Disposition: A | Discharge status: Home / Self Care | Payer: Medicare Other | Source: Ambulatory Visit | Attending: Cardiovascular Disease | Admitting: Cardiovascular Disease

## 2012-10-29 DIAGNOSIS — I77810 Thoracic aortic ectasia: Secondary | ICD-10-CM

## 2012-10-29 MED ORDER — IOHEXOL 350 MG/ML SOLN
80.0000 mL | Freq: Once | INTRAVENOUS | Status: AC | PRN
  Administered 2012-10-29: 80 mL via INTRAVENOUS

## 2012-11-08 ENCOUNTER — Other Ambulatory Visit (HOSPITAL_COMMUNITY): Payer: Self-pay | Admitting: Cardiovascular Disease

## 2012-11-08 DIAGNOSIS — I351 Nonrheumatic aortic (valve) insufficiency: Secondary | ICD-10-CM

## 2012-11-18 ENCOUNTER — Ambulatory Visit (HOSPITAL_COMMUNITY)
Admission: RE | Admit: 2012-11-18 | Discharge: 2012-11-18 | Disposition: A | Discharge status: Home / Self Care | Payer: Medicare Other | Source: Ambulatory Visit | Attending: Cardiovascular Disease | Admitting: Cardiovascular Disease

## 2012-11-18 DIAGNOSIS — I359 Nonrheumatic aortic valve disorder, unspecified: Secondary | ICD-10-CM | POA: Insufficient documentation

## 2012-11-18 DIAGNOSIS — I351 Nonrheumatic aortic (valve) insufficiency: Secondary | ICD-10-CM

## 2012-11-18 NOTE — Progress Notes (Signed)
2D Echo Performed 11/18/2012    Oluwatoni Rotunno, RCS  

## 2013-01-06 ENCOUNTER — Ambulatory Visit: Payer: Medicare Other | Admitting: Internal Medicine

## 2013-02-18 ENCOUNTER — Ambulatory Visit: Payer: Medicare Other | Admitting: Internal Medicine

## 2013-02-25 ENCOUNTER — Encounter: Payer: Self-pay | Admitting: Internal Medicine

## 2013-02-25 ENCOUNTER — Ambulatory Visit (INDEPENDENT_AMBULATORY_CARE_PROVIDER_SITE_OTHER): Payer: Medicare Other | Admitting: Internal Medicine

## 2013-02-25 VITALS — HR 105 | Temp 98.1°F | Ht 68.0 in | Wt 184.2 lb

## 2013-02-25 DIAGNOSIS — J449 Chronic obstructive pulmonary disease, unspecified: Secondary | ICD-10-CM

## 2013-02-25 DIAGNOSIS — R040 Epistaxis: Secondary | ICD-10-CM

## 2013-02-25 DIAGNOSIS — F17200 Nicotine dependence, unspecified, uncomplicated: Secondary | ICD-10-CM

## 2013-02-25 MED ORDER — BUPROPION HCL ER (SR) 150 MG PO TB12: ORAL_TABLET | ORAL | Status: DC

## 2013-02-25 NOTE — Assessment & Plan Note (Signed)
#  COPD  - stable - continue spiriva and symbicort - referring you to pulmonary rehab exercise therapy

## 2013-02-25 NOTE — Assessment & Plan Note (Signed)
#  Smoking - Slow quit smoking by July 7 (This week on Sunday), 2014  - STart wellbutrin:  Initial: 150 mg once daily for 3 days; increase to 150 mg twice daily treatment which you should continue for 12 weeks  3 min face to face counseling  #FOllowup 1 month with Tammy our NP for med calendar and to review quit smoking

## 2013-02-25 NOTE — Assessment & Plan Note (Signed)
#  Sinus drainage and nose bleeds  - follow advise of ENT doctor; glad nose bleed are better

## 2013-02-25 NOTE — Patient Instructions (Addendum)
#  Sinus drainage and nose bleeds  - follow advise of ENT doctor; glad nose bleed are better  #COPD  - stable - continue spiriva and symbicort - referring you to pulmonary rehab exercise therapy   #Smoking - Slow quit smoking by July 7 (This week on Sunday), 2014  - STart wellbutrin:  Initial: 150 mg once daily for 3 days; increase to 150 mg twice daily treatment which you should continue for 12 weeks   #FOllowup 1 month with Tammy our NP for med calendar and to review quit smoking    #Followup - 3 months or sooner if needed - Will call with CAT scan of the chest result -

## 2013-02-25 NOTE — Progress Notes (Signed)
Subjective:    Patient ID: Mitchell Wood, male    DOB: 02-03-1935, 78 y.o.   MRN: [id number]  HPI PCP is Alva Garnet., MD Body mass index is 28.74 kg/(m^2).  reports that he has been smoking Cigarettes.  He has a 50 pack-year smoking history.    IOV 07/01/2012 78 year old AA male.  50 pack current smoker. Referred per him "check my chest out". He is poor historian. Wife supplementing hx. Wife says he is a typical male who will not go to MD, do tests, follow instructions but she is going to be strict with him and ensures he follows directions  C/o Wheezing x years. Insidious onset. On and off. Triggered by dust, cigarettes (still smokes). Improved by no specific factors but wife recollects 3 years ago some inhaler might have helped and 3 weeks ago was given symbicort which he thinks helps. He is using symbicort past week. Severity fluctutates between mild and moderate. Symptoms stable over time. There is associated dry cough daily morning that is improved by getting fresh air. 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 Trying to quit he says by praying or thinking about it. He says he can go for several hours wihtout smoking. If he is not busy he will smoke     LAbs  2010 creat is 0.9mg % Jan 2013 - hgb is 11.7gm% April 2013 - CXR is hypperinflation. No infiltrates   REC Please have full PFt breathing test  Please do walk test on room air  CMA will ensure you are uptodate with flu and pneumovax  REturn to see me or NP Tammy after above  At followup we can discuss low dose CT chest for cancer screening and quit    OV  07/24/2012  Followup to discuss  A) wheezing - pft results. See below. Shows moderate obstruction c/w gold stge 2 copd and mixed restriction B) smoking - he says he will quit for sure after he completes current pack. Wife sneers at him with body language that he would not quit smoking. Refused meds C) lung cancer screen - discused. He refused low dose ct due to cost    #Wheezing and copd  - the reason you are wheezing is that you have copd otherwise called emphysema or chronic bronchitis  - Please start spiriva 1 puff daily - take sample, script and show technique  - at fu wil consider CT chest for ILD  #Smoking  - smoking is the reason why you have copd  - as discussed I respect your decision to refuse medications to help quit smoking  - I trust you will quit smoking by yourself after using the current pack  #Lung cancer screening  - we discussed ct scan for this and respect your decision to not have it for now due to lack of money  #Followup  2 months  Walk test and CAT score at followup     OV 09/23/2012   Followup for COPD, smoking, and abnormal PFTs ( mixed picture of obstruction and restriction)  - At last visit  was started on spriiva. With this he thinks he is he is better. He might be on Symbicort according to her medication list but he denies this. Overall he and wife reports debility and pulmonary health. He continues to go to the gym but refuses referral to pulmonary rehabilitation. His COPD CAD score is actually worse at 14.5 compared to 9 at the last visit but he is a very poor historian am not sure I can rely on his course. Clinically seems to COPD stable but am unable to determine if his inhalers are helping him but I suspect so  - Smoking: He continues to smoke despite promising  last visit he would quit. He says he's cut down smoking and he can go several hours without smoking  - New issue: He has chronic sinus drainage all his life and when I recommended sinus  inhalers wife and he reported history of chronic epistaxis for over 25 years. Apparently he has had one or 2 admissions to the emergency room with normal blood pressure but severe epistaxis requiring packing. He was on Coumadin at some point for a to fibrillation but they report epistaxis is unrelated to Coumadin. He is only on aspirin prophylactic dose. He has epistaxis at least 1-2 times a week most recently a few days ago. He has no specialist following him for this issue     PFT FVC fev1 ratio BD fev1 TLC DLCO comment  07/24/2012  2.9L/74% 1.7L/66% 58/low% 3% 3.5L/59% 12/61% Cleda Daub - obstruction Lung volume - restriction Diffusion - reduced Summary - mixed picture   REC  #Sinus drainage and nose bleeds  - see ENT doctor  - do not start nasal meds till you see them  #COPD  - stable - continue spiriva  - are you on symbicort ? Nurse will confirm. If not, we will address this next visit. If so, continue - contiunue exercises at the gym. At some point in the future will refer you to pulmonary rehabilitation  #Shortness of breath with abnormal breathing test - Do cat scan of the chest to rule out scarring process in the lung  #Smoking  - As discussed please work on quitting smoking  #Followup - 3 months or sooner if needed - Will call with CAT scan of the chest result -    telephine call feb 2014 Ct chest does not show scarring process or lung cancer  - good news  However, blood vessels of heart have calcium on it. He might have heart vessel blockage. REfered to Dr Allyson Sabal  OV 02/25/2013  FU . Wife not with him  Epistaxis: says he saw an ENT. Does not know who. Given some spray. And now epistaxis improve  Coronary artery calcification: Says he saw Dr Allyson Sabal. He thinks he had a stress test and this was all clear  Smoking: Still smoking. Wants to quit. Denies depression, seizure, firearms. Wants medication help. Denies baddream  COPD: Stable. Reports compliance with spiriva  andsymbicport. COPD cat score is 8 and inmproved from before. Not going to [facility]. Will refer him to rehab  CAT COPD Symptom & Quality of Life Score (GSK trademark) 0 is no burden. 5 is highest burden 07/01/2012  09/23/2012  02/25/2013   Never Cough -> Cough all the time 3 3 2   No phlegm in chest -> Chest is full of phlegm 2 3 1   No chest tightness -> Chest feels very tight 0 1 1  No dyspnea for 1 flight stairs/hill -> Very dyspneic for  1 flight of stairs 1 2 0  No limitations for ADL at home -> Very limited with ADL at home 0 2 2  Confident leaving home -> Not at all confident leaving home 0 1.5 0  Sleep soundly -> Do not sleep soundly because of lung condition 1 0 0  Lots of Energy -> No energy at all 2 2 2   TOTAL Score (max 40)  9 14.5 8  BMI   Body mass index is 28.01 kg/(m^2).     Past, Family, Social reviewed: no change since last visit  has a past medical history of Atrial fibrillation; Anxiety; ED (erectile dysfunction); ETOH abuse; Hematospermia; Prostate cancer; Ventral hernia; COPD (chronic obstructive pulmonary disease); Vitamin D deficiency; Tobacco abuse; Coronary artery disease (10/14/12); Chronic atrial fibrillation; RBBB; Moderate aortic insufficiency (10/11/11); Glaucoma; and PVD (peripheral vascular disease) (10/14/12).    Review of Systems  Constitutional: Negative for fever and unexpected weight change.  HENT: Negative for ear pain, nosebleeds, congestion, sore throat, rhinorrhea, sneezing, trouble swallowing, dental problem, postnasal drip and sinus pressure.   Eyes: Negative for redness and itching.  Respiratory: Negative for cough, chest tightness, shortness of breath and wheezing.   Cardiovascular: Negative for palpitations and leg swelling.  Gastrointestinal: Negative for nausea and vomiting.  Genitourinary: Negative for dysuria.  Musculoskeletal: Negative for joint swelling.  Skin: Negative for rash.  Neurological: Negative for headaches.  Hematological: Does not  bruise/bleed easily.  Psychiatric/Behavioral: Negative for dysphoric mood. The patient is not nervous/anxious.    Current outpatient prescriptions:acetaminophen (TYLENOL) 325 MG tablet, Take 2 tablets (650 mg total) by mouth every 4 (four) hours as needed., Disp: , Rfl: ;  aspirin EC 81 MG tablet, Take 81 mg by mouth daily., Disp: , Rfl: ;  budesonide-formoterol (SYMBICORT) 160-4.5 MCG/ACT inhaler, Inhale 2 puffs into the lungs 2 (two) times daily., Disp: 1 Inhaler, Rfl: 6 ergocalciferol (VITAMIN D2) 50000 UNITS capsule, Take 50,000 Units by mouth 2 (two) times a week., Disp: , Rfl: ;  fish oil-omega-3 fatty acids 1000 MG capsule, Take 2 g by mouth daily., Disp: , Rfl: ;  isosorbide mononitrate (IMDUR) 30 MG 24 hr tablet, Take 30 mg by mouth daily., Disp: , Rfl: ;  metoprolol tartrate (LOPRESSOR) 25 MG tablet, Take 1 tablet (25 mg total) by mouth 2 (two) times daily., Disp: 60 tablet, Rfl: 5 nitroGLYCERIN (NITROSTAT) 0.4 MG SL tablet, Place 0.4 mg under the tongue every 5 (five) minutes as needed for chest pain., Disp: , Rfl: ;  sodium chloride (OCEAN) 0.65 % SOLN nasal spray, Place 1 spray into the nose daily as needed (For nosebleeds.). , Disp: , Rfl: ;  tiotropium (SPIRIVA) 18 MCG inhalation capsule, Place 18 mcg into inhaler and inhale daily., Disp: , Rfl:  Travoprost, BAK Free, (TRAVATAN) 0.004 % SOLN ophthalmic solution, Place 1 drop into both eyes at bedtime., Disp: , Rfl:      Objective:   Physical Exam  Nursing note and vitals reviewed. Constitutional: He is oriented to person, place, and time. He appears well-developed and well-nourished. No distress.  HENT:  Head: Normocephalic and atraumatic.  Right Ear: External ear normal.  Left Ear: External ear normal.  Mouth/Throat: Oropharynx is clear and moist. No oropharyngeal exudate.  Partially edentulous  Eyes: Conjunctivae normal and EOM are normal. Pupils are equal, round, and reactive to light. Right eye exhibits no discharge. Left eye  exhibits no discharge. No scleral icterus.  Neck: Normal range of motion. Neck supple. No JVD present. No tracheal deviation  present. No thyromegaly present.  Cardiovascular: Normal rate, regular rhythm and intact distal pulses.  Exam reveals no gallop and no friction rub.   No murmur heard. Pulmonary/Chest: Effort normal. No respiratory distress. He has no wheezes. He has NO RALLES He exhibits no tenderness.       Mild barrel chest Abdominal: Soft. Bowel sounds are normal. He exhibits no distension and no mass. There is no tenderness. There is no rebound and no guarding.  Musculoskeletal: Normal range of motion. He exhibits no edema and no tenderness.  Lymphadenopathy:    He has no cervical adenopathy.  Neurological: He is alert and oriented to person, place, and time. He has normal reflexes. No cranial nerve deficit. Coordination normal.  Skin: Skin is warm and dry. No rash noted. He is not diaphoretic. No erythema. No pallor.  Psychiatric: He has a normal mood and affect. His behavior is normal. Judgment and thought content normal.           Assessment & Plan:

## 2013-03-28 ENCOUNTER — Encounter: Payer: Medicare Other | Admitting: Adult Health

## 2013-04-01 ENCOUNTER — Encounter: Payer: Self-pay | Admitting: Adult Health

## 2013-04-23 ENCOUNTER — Ambulatory Visit: Payer: Medicare Other | Admitting: Cardiovascular Disease

## 2013-05-08 ENCOUNTER — Encounter: Payer: Self-pay | Admitting: Cardiovascular Disease

## 2013-05-08 ENCOUNTER — Ambulatory Visit (INDEPENDENT_AMBULATORY_CARE_PROVIDER_SITE_OTHER): Payer: Medicare Other | Admitting: Cardiovascular Disease

## 2013-05-08 VITALS — BP 102/60 | HR 79 | Ht 68.0 in | Wt 186.0 lb

## 2013-05-08 DIAGNOSIS — Z79899 Other long term (current) drug therapy: Secondary | ICD-10-CM

## 2013-05-08 DIAGNOSIS — I4510 Unspecified right bundle-branch block: Secondary | ICD-10-CM

## 2013-05-08 DIAGNOSIS — I482 Chronic atrial fibrillation, unspecified: Secondary | ICD-10-CM

## 2013-05-08 DIAGNOSIS — J449 Chronic obstructive pulmonary disease, unspecified: Secondary | ICD-10-CM

## 2013-05-08 DIAGNOSIS — I351 Nonrheumatic aortic (valve) insufficiency: Secondary | ICD-10-CM

## 2013-05-08 DIAGNOSIS — I712 Thoracic aortic aneurysm, without rupture: Secondary | ICD-10-CM

## 2013-05-08 DIAGNOSIS — E785 Hyperlipidemia, unspecified: Secondary | ICD-10-CM | POA: Insufficient documentation

## 2013-05-08 DIAGNOSIS — I359 Nonrheumatic aortic valve disorder, unspecified: Secondary | ICD-10-CM

## 2013-05-08 DIAGNOSIS — I4891 Unspecified atrial fibrillation: Secondary | ICD-10-CM

## 2013-05-08 DIAGNOSIS — I2510 Atherosclerotic heart disease of native coronary artery without angina pectoris: Secondary | ICD-10-CM

## 2013-05-08 NOTE — Assessment & Plan Note (Signed)
Rate controlled not on Coumadin anticoagulation because of prior bleeding.

## 2013-05-08 NOTE — Assessment & Plan Note (Signed)
His left the echo in March showed mild AI with normal LV systolic function.

## 2013-05-08 NOTE — Assessment & Plan Note (Signed)
Found at the time of cardiac catheterization in February when he was noticed to have a generous aorta. CT and REM perform a volume of showed his descending thoracic aorta measuring 4.2 cm. Will continue to follow this on an annual basis.

## 2013-05-08 NOTE — Patient Instructions (Addendum)
  Your physician wants you to follow-up with him in : 1 year                                             and with an extender in : 6 months ([facility] Shelter)                    You will receive a reminder letter in the mail one month in advance. If you don't receive a letter, please call our office to schedule the follow-up appointment.   Your physician recommends that you return for lab work in: 1-2 weeks, fasting    Your physician has ordered the following tests: Ct angiogram in March to evaluate thoracic aortic aneurysm

## 2013-05-08 NOTE — Assessment & Plan Note (Signed)
The patient continues to smoke. I counseled him about the importance of smoking cessation

## 2013-05-08 NOTE — Assessment & Plan Note (Signed)
On statin therapy. We'll recheck a lipid and liver profile 

## 2013-05-08 NOTE — Progress Notes (Signed)
05/08/2013 Mitchell Wood   [birth year]  [id number]  Primary Physician Alva Garnet., MD Primary Cardiologist: Runell Gess MD [name]   HPI:  The patient is a pleasant 78 year old male who we saw October 08, 2012, as a referral from Dr. Marchelle Gearing after it was noted on a CT scan that he had coronary calcifications. On interview, the patient did admit to some chest pain, especially when he was at the gym. We decided to proceed with a diagnostic catheterization as the patient had had a previous Myoview, August 2010, that was not entirely normal, showing mild ischemia in the mid-anterior and apical regions. The patient underwent diagnostic catheterization by Dr. Allyson Sabal, October 14, 2012. This revealed a calcified 50% to 60% narrowing in the mid LAD, but no other significant coronary disease. He had preserved LV function. He had a generous descending thoracic aorta. He may require a CT angiogram in the future to further quantify this. He saw [name] back in February and has been doing well since. He denies chest pain or shortness of breath. A CT and a gram performed in March showed 8 H&E thoracic aortic aneurysm measuring 4.2 cm. The patient does continue to smoke.   Current Outpatient Prescriptions  Medication Sig Dispense Refill  . acetaminophen (TYLENOL) 325 MG tablet Take 2 tablets (650 mg total) by mouth every 4 (four) hours as needed.      Marland Kitchen aspirin EC 81 MG tablet Take 81 mg by mouth daily.      . budesonide-formoterol (SYMBICORT) 160-4.5 MCG/ACT inhaler Inhale 2 puffs into the lungs 2 (two) times daily.  1 Inhaler  6  . buPROPion (WELLBUTRIN SR) 150 MG 12 hr tablet 150mg  x 3 days then increase to 150mg  twice a day x 12 weeks  65 tablet  1  . ergocalciferol (VITAMIN D2) 50000 UNITS capsule Take 50,000 Units by mouth 2 (two) times a week.      . fish oil-omega-3 fatty acids 1000 MG capsule Take 2 g by mouth daily.      . isosorbide mononitrate (IMDUR) 30 MG 24 hr  tablet Take 30 mg by mouth daily.      . metoprolol tartrate (LOPRESSOR) 25 MG tablet Take 1 tablet (25 mg total) by mouth 2 (two) times daily.  60 tablet  5  . nitroGLYCERIN (NITROSTAT) 0.4 MG SL tablet Place 0.4 mg under the tongue every 5 (five) minutes as needed for chest pain.      . simvastatin (ZOCOR) 5 MG tablet Take 5 mg by mouth at bedtime.      . sodium chloride (OCEAN) 0.65 % SOLN nasal spray Place 1 spray into the nose daily as needed (For nosebleeds.).       Marland Kitchen tiotropium (SPIRIVA) 18 MCG inhalation capsule Place 18 mcg into inhaler and inhale daily.      . Travoprost, BAK Free, (TRAVATAN) 0.004 % SOLN ophthalmic solution Place 1 drop into both eyes at bedtime.       No current facility-administered medications for this visit.    No Known Allergies  History   Social History  . Marital Status: Married    Spouse Name: N/A    Number of Children: N/A  . Years of Education: N/A   Occupational History  . retired    Social History Main Topics  . Smoking status: Current Every Day Smoker -- 1.00 packs/day for 50 years    Types: Cigarettes  . Smokeless tobacco: Never Used     Comment:  30+ yr  . Alcohol Use: No  . Drug Use: Not on file  . Sexual Activity: Not on file   Other Topics Concern  . Not on file   Social History Narrative  . No narrative on file     Review of Systems: General: negative for chills, fever, night sweats or weight changes.  Cardiovascular: negative for chest pain, dyspnea on exertion, edema, orthopnea, palpitations, paroxysmal nocturnal dyspnea or shortness of breath Dermatological: negative for rash Respiratory: negative for cough or wheezing Urologic: negative for hematuria Abdominal: negative for nausea, vomiting, diarrhea, bright red blood per rectum, melena, or hematemesis Neurologic: negative for visual changes, syncope, or dizziness All other systems reviewed and are otherwise negative except as noted above.    Blood pressure 102/60,  pulse 79, height 5\' 8"  (1.727 m), weight 186 lb (84.369 kg).  General appearance: alert and no distress Neck: no adenopathy, no carotid bruit, no JVD, supple, symmetrical, trachea midline and thyroid not enlarged, symmetric, no tenderness/mass/nodules Lungs: clear to auscultation bilaterally Heart: irregularly irregular rhythm Extremities: extremities normal, atraumatic, no cyanosis or edema  EKG atrial fibrillation with a ventricular response of 79 and bifascicular block (right bundle branch block/left anterior fascicular block)  ASSESSMENT AND PLAN:   Aortic insufficiency, moderate His left the echo in March showed mild AI with normal LV systolic function.  Chronic atrial fibrillation, not on Coumadin secondary to history of GI bleeding Rate controlled not on Coumadin anticoagulation because of prior bleeding.  Thoracic aortic aneurysm Found at the time of cardiac catheterization in February when he was noticed to have a generous aorta. CT and REM perform a volume of showed his descending thoracic aorta measuring 4.2 cm. Will continue to follow this on an annual basis.  COPD (chronic obstructive pulmonary disease) The patient continues to smoke. I counseled him about the importance of smoking cessation  Hyperlipidemia On statin therapy. We'll recheck a lipid and liver profile      Runell Gess MD [facility], [facility] 05/08/2013 9:19 AM

## 2013-05-28 ENCOUNTER — Other Ambulatory Visit: Payer: Self-pay | Admitting: Cardiology

## 2013-05-28 NOTE — Telephone Encounter (Signed)
Rx was sent to pharmacy electronically. 

## 2013-06-19 ENCOUNTER — Encounter: Payer: Self-pay | Admitting: Cardiovascular Disease

## 2013-09-26 IMAGING — CT CT ANGIO CHEST
3 of 6 series · 19 of 36 positions shown · IV contrast (80CC OMNI 350)
Comparison: Unenhanced high-resolution CT of the chest on
09/27/2012.

CLINICAL DATA: Dilated aortic root on cardiac catheterization.

CT ANGIOGRAPHY CHEST
TECHNIQUE: Multidetector CT imaging of the chest using the
standard protocol during bolus administration of intravenous
contrast. Multiplanar reconstructed images including MIPs were
obtained and reviewed to evaluate the vascular anatomy.
Contrast: 80mL OMNIPAQUE IOHEXOL 350 MG/ML SOLN

[Series 4: angio · axial · 0.70mm/px · z∈[-218,+22]mm · 9 of 120 slices shown]
[im 12/120  lung]
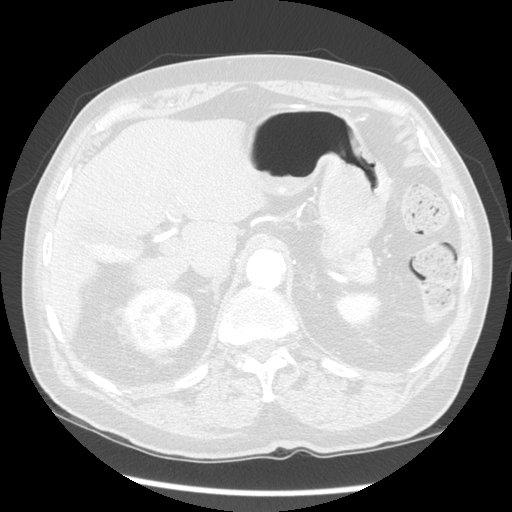
[im 24/120  mediastinal]
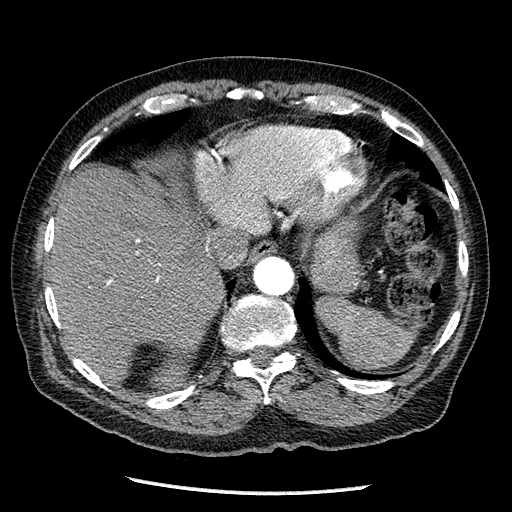
[im 36/120  lung]
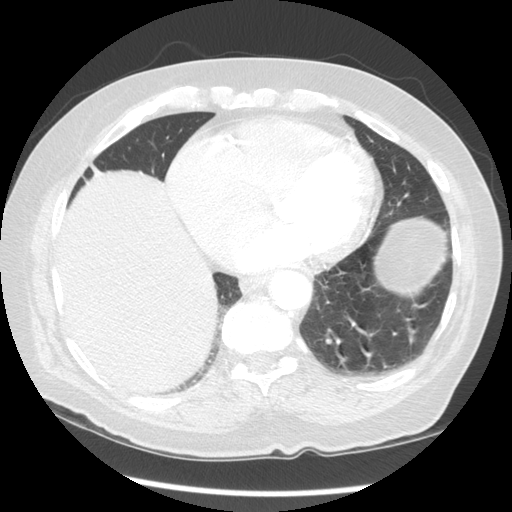
[im 48/120  mediastinal]
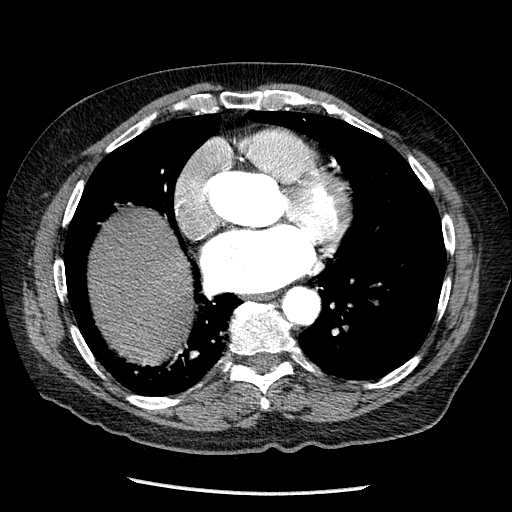
[im 60/120  lung]
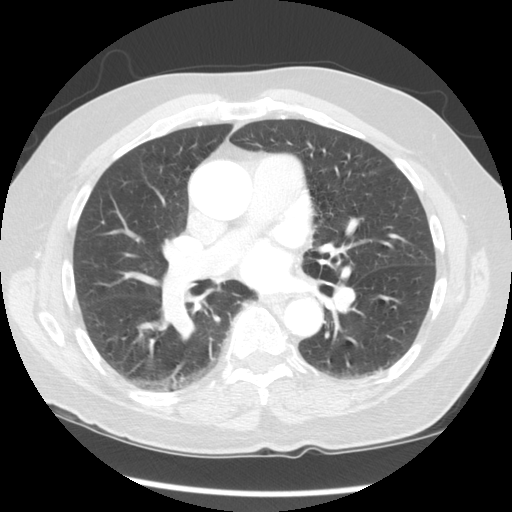
[im 72/120  mediastinal]
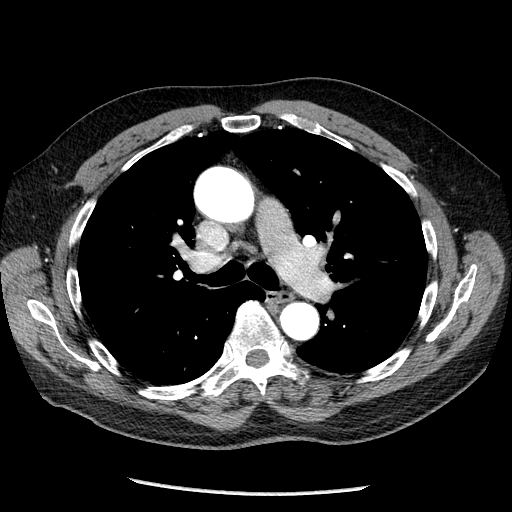
[im 84/120  lung]
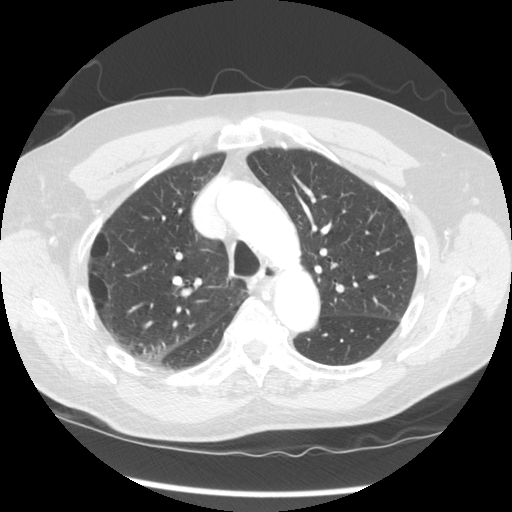
[im 96/120  mediastinal]
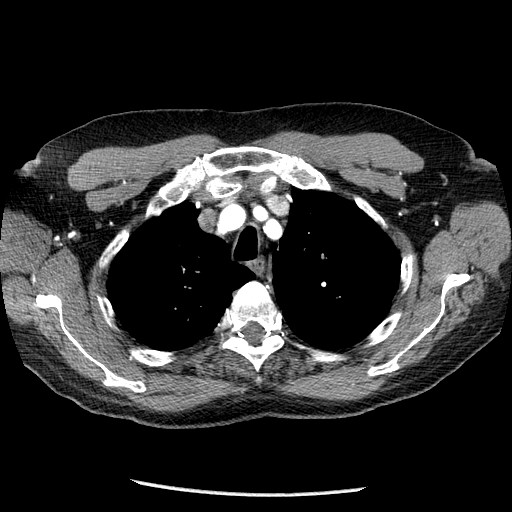
[im 108/120  lung]
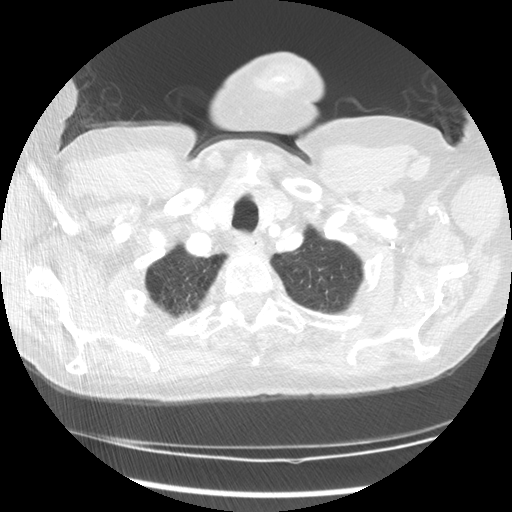

[Series 5: lung windows · axial · 0.70mm/px · z∈[-173,-98]mm · 2 of 60 slices shown]
[im 15/60  mediastinal]
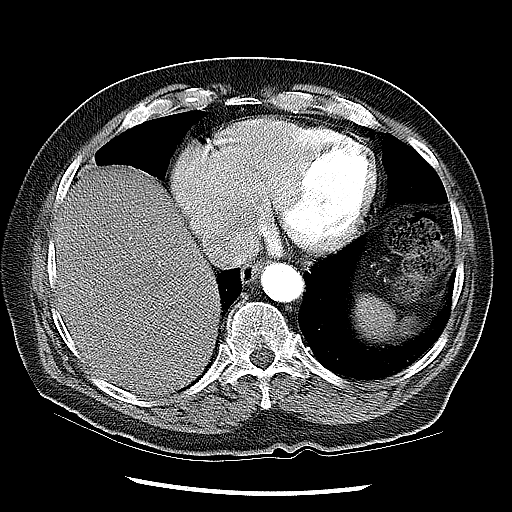
[im 30/60  mediastinal]
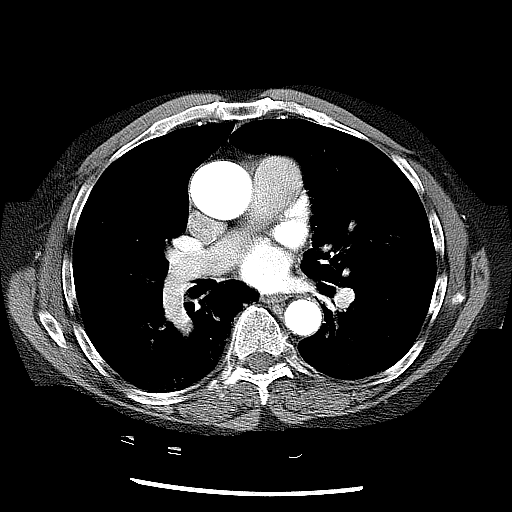

[Series 602: sagittal body · sagittal · 0.70mm/px · 8 of 145 slices shown]
[im 14/145  lung]
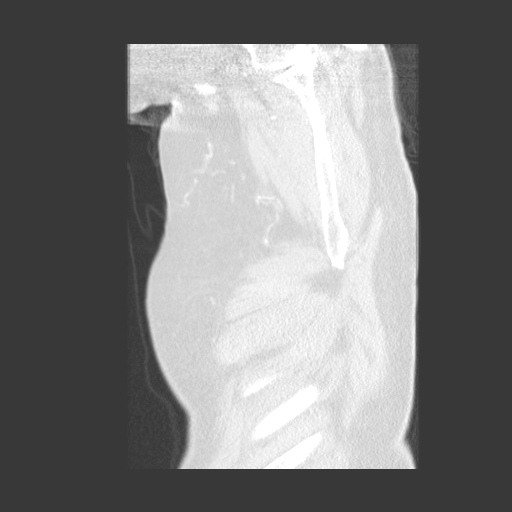
[im 27/145  lung]
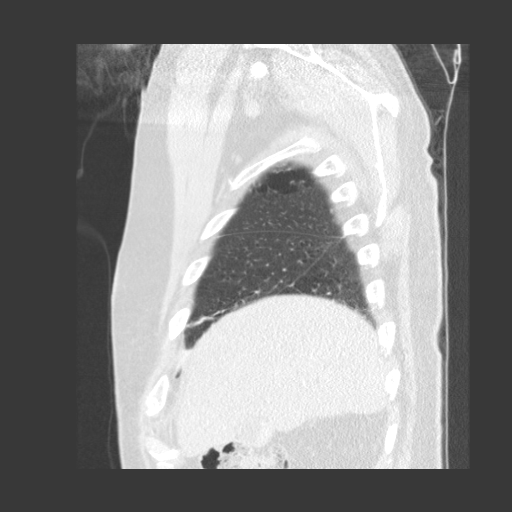
[im 53/145  lung]
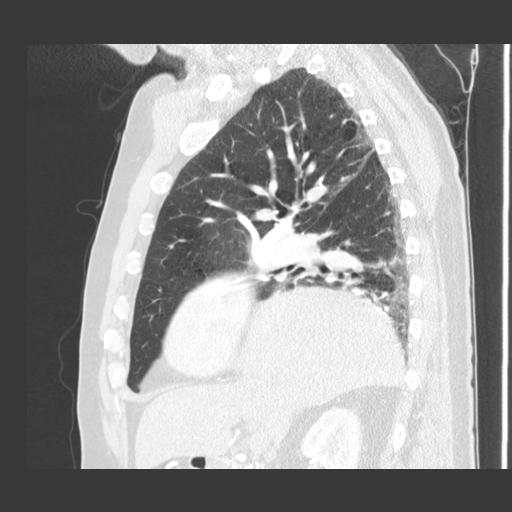
[im 66/145  lung]
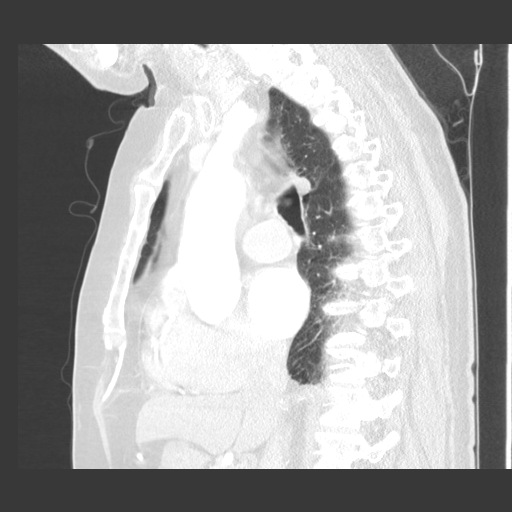
[im 79/145  lung]
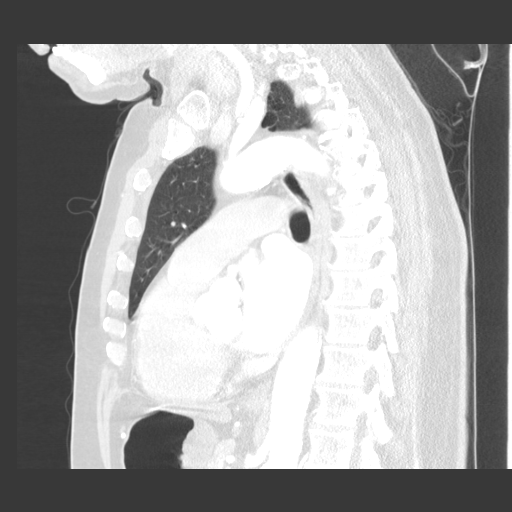
[im 92/145  lung]
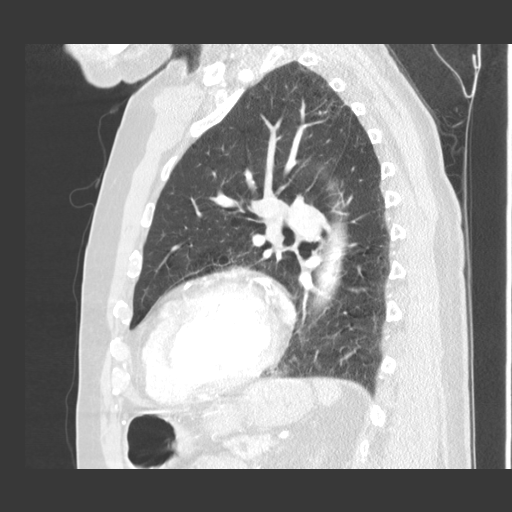
[im 118/145  lung]
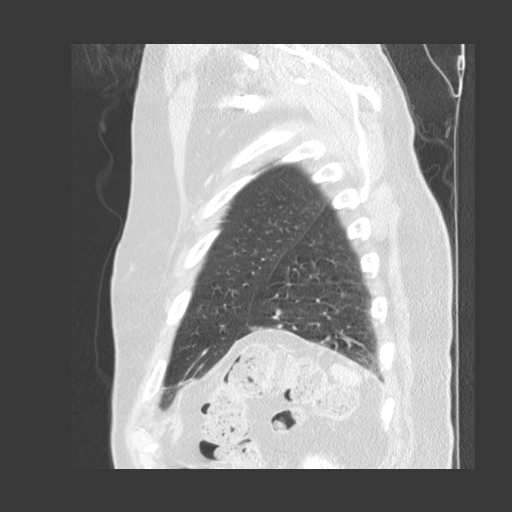
[im 131/145  lung]
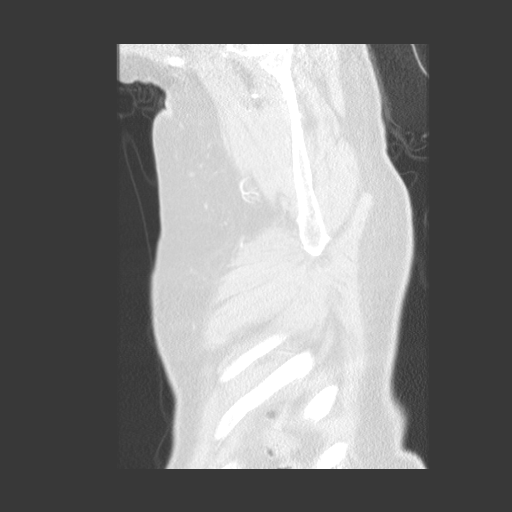

[19 of 36 positions shown; findings below may reference images not displayed]

FINDINGS: There is dilatation of the aortic root to which is not
involve the sinuses of Valsalva.  The proximal ascending thoracic
aorta shows maximal diameter of 4.1 - 4.2 cm.  The proximal arch
measures 3.6 cm.  The distal arch measures 3.0 cm.  The descending
thoracic aorta measures 2.7 - 2.8 cm.

There is no evidence of aortic dissection.  Proximal great vessels
show normal branching anatomy and scattered plaque without evidence
of significant occlusive disease.  Calcified coronary artery plaque
identified.  No evidence of penetrating ulcer disease.

The heart is mildly enlarged.  No pleural or pericardial fluid is
seen.  Stable emphysematous lung disease present.   Previously
noted 5 mm right upper lobe lung nodule is less evident with small
area of nodularity remaining.  There is a tiny area of subpleural
nodularity in the superior segment of the right lower lobe on image
number 29.  The tiny area of lingular nodularity seen previously is
less evident.  No enlarged lymph nodes are seen.  Stable layering
gallstones in the gallbladder.
IMPRESSION: 1.  Mild aneurysmal dilatation of the proximal ascending thoracic
aorta with maximal measured diameter of 4.1 - 4.2 cm.
2.  Stable to slightly less prominent tiny lung nodules.

## 2013-09-30 ENCOUNTER — Other Ambulatory Visit: Payer: Self-pay | Admitting: Cardiology

## 2013-09-30 NOTE — Telephone Encounter (Signed)
Rx was sent to pharmacy electronically. 

## 2013-10-15 ENCOUNTER — Other Ambulatory Visit: Payer: Self-pay | Admitting: Cardiovascular Disease

## 2013-10-15 NOTE — Telephone Encounter (Signed)
Rx was sent to pharmacy electronically. 

## 2013-10-20 ENCOUNTER — Other Ambulatory Visit: Payer: Self-pay | Admitting: *Deleted

## 2013-10-20 DIAGNOSIS — Z79899 Other long term (current) drug therapy: Secondary | ICD-10-CM

## 2013-11-06 ENCOUNTER — Other Ambulatory Visit: Payer: Self-pay | Admitting: Cardiovascular Disease

## 2013-11-07 LAB — CREATININE, SERUM: CREATININE: 0.91 mg/dL (ref 0.50–1.35)

## 2013-11-07 LAB — BUN: BUN: 18 mg/dL (ref 6–23)

## 2013-11-07 LAB — BUN+CREAT: BUN/Creatinine Ratio: 19.8 Ratio

## 2013-11-10 ENCOUNTER — Ambulatory Visit
Admission: RE | Admit: 2013-11-10 | Discharge: 2013-11-10 | Disposition: A | Discharge status: Home / Self Care | Payer: Medicare Other | Source: Ambulatory Visit | Attending: Cardiovascular Disease | Admitting: Cardiovascular Disease

## 2013-11-10 ENCOUNTER — Telehealth: Payer: Self-pay | Admitting: Cardiovascular Disease

## 2013-11-10 DIAGNOSIS — I712 Thoracic aortic aneurysm, without rupture, unspecified: Secondary | ICD-10-CM

## 2013-11-10 MED ORDER — IOHEXOL 350 MG/ML SOLN
80.0000 mL | Freq: Once | INTRAVENOUS | Status: AC | PRN
  Administered 2013-11-10: 80 mL via INTRAVENOUS

## 2013-11-10 NOTE — Telephone Encounter (Signed)
Radiology called report CTA results, received and passed on to Brita Romp RN

## 2013-11-11 ENCOUNTER — Telehealth: Payer: Self-pay | Admitting: *Deleted

## 2013-11-11 DIAGNOSIS — I712 Thoracic aortic aneurysm, without rupture, unspecified: Secondary | ICD-10-CM

## 2013-11-11 NOTE — Telephone Encounter (Signed)
Returning your call. °

## 2013-11-11 NOTE — Telephone Encounter (Signed)
Dr [name] reviewed the CT.  Aneurysm is stable.  Follow up with primary MD for PET scan or additional testing.  Repeat CT for aneurysm in 1 year.    Patient notified of results He verbalized understanding of the need to follow up with primary care.  I faxed CT report to Dr Karlton Lemon.  I will place the order for a repeat CT in 1 year.

## 2013-12-16 ENCOUNTER — Other Ambulatory Visit: Payer: Self-pay | Admitting: Cardiovascular Disease

## 2013-12-16 NOTE — Telephone Encounter (Signed)
Rx was sent to pharmacy electronically. Patient needs lab work done for future refills.

## 2013-12-16 NOTE — Telephone Encounter (Signed)
Ben from [employer] was calling in regards to Mr. Jarmon Simvastatin needing to be refilled. He is completely out of the medication.   JB

## 2014-01-12 ENCOUNTER — Other Ambulatory Visit: Payer: Self-pay | Admitting: Cardiovascular Disease

## 2014-01-12 NOTE — Telephone Encounter (Signed)
Rx refill sent to patient pharmacy   

## 2014-05-13 ENCOUNTER — Other Ambulatory Visit: Payer: Self-pay | Admitting: Cardiovascular Disease

## 2014-06-23 ENCOUNTER — Other Ambulatory Visit: Payer: Self-pay | Admitting: Cardiovascular Disease

## 2014-06-23 NOTE — Telephone Encounter (Signed)
Rx was sent to pharmacy electronically. OV 11/20

## 2014-06-23 NOTE — Telephone Encounter (Signed)
Rx was sent to pharmacy electronically. 

## 2014-06-25 ENCOUNTER — Other Ambulatory Visit: Payer: Self-pay | Admitting: Cardiovascular Disease

## 2014-06-25 NOTE — Telephone Encounter (Signed)
Rx was sent to pharmacy electronically. OV 07/17/14

## 2014-07-10 ENCOUNTER — Encounter (HOSPITAL_COMMUNITY): Payer: Self-pay | Admitting: *Deleted

## 2014-07-10 ENCOUNTER — Observation Stay (HOSPITAL_COMMUNITY)
Admission: EM | Admit: 2014-07-10 | Discharge: 2014-07-13 | Disposition: A | Discharge status: Home / Self Care | Payer: Medicare Other | Attending: Internal Medicine | Admitting: Internal Medicine

## 2014-07-10 DIAGNOSIS — D649 Anemia, unspecified: Secondary | ICD-10-CM

## 2014-07-10 DIAGNOSIS — Z8546 Personal history of malignant neoplasm of prostate: Secondary | ICD-10-CM | POA: Insufficient documentation

## 2014-07-10 DIAGNOSIS — J439 Emphysema, unspecified: Secondary | ICD-10-CM

## 2014-07-10 DIAGNOSIS — Z9221 Personal history of antineoplastic chemotherapy: Secondary | ICD-10-CM | POA: Insufficient documentation

## 2014-07-10 DIAGNOSIS — D62 Acute posthemorrhagic anemia: Principal | ICD-10-CM | POA: Diagnosis present

## 2014-07-10 DIAGNOSIS — F1010 Alcohol abuse, uncomplicated: Secondary | ICD-10-CM | POA: Diagnosis not present

## 2014-07-10 DIAGNOSIS — F17210 Nicotine dependence, cigarettes, uncomplicated: Secondary | ICD-10-CM | POA: Insufficient documentation

## 2014-07-10 DIAGNOSIS — Z720 Tobacco use: Secondary | ICD-10-CM

## 2014-07-10 DIAGNOSIS — Z79899 Other long term (current) drug therapy: Secondary | ICD-10-CM | POA: Insufficient documentation

## 2014-07-10 DIAGNOSIS — R42 Dizziness and giddiness: Secondary | ICD-10-CM | POA: Diagnosis not present

## 2014-07-10 DIAGNOSIS — Z7982 Long term (current) use of aspirin: Secondary | ICD-10-CM | POA: Diagnosis not present

## 2014-07-10 DIAGNOSIS — I9589 Other hypotension: Secondary | ICD-10-CM

## 2014-07-10 DIAGNOSIS — I358 Other nonrheumatic aortic valve disorders: Secondary | ICD-10-CM | POA: Diagnosis not present

## 2014-07-10 DIAGNOSIS — I2510 Atherosclerotic heart disease of native coronary artery without angina pectoris: Secondary | ICD-10-CM | POA: Diagnosis not present

## 2014-07-10 DIAGNOSIS — J849 Interstitial pulmonary disease, unspecified: Secondary | ICD-10-CM | POA: Diagnosis not present

## 2014-07-10 DIAGNOSIS — N529 Male erectile dysfunction, unspecified: Secondary | ICD-10-CM | POA: Diagnosis not present

## 2014-07-10 DIAGNOSIS — F419 Anxiety disorder, unspecified: Secondary | ICD-10-CM | POA: Diagnosis not present

## 2014-07-10 DIAGNOSIS — I482 Chronic atrial fibrillation, unspecified: Secondary | ICD-10-CM | POA: Diagnosis present

## 2014-07-10 DIAGNOSIS — J449 Chronic obstructive pulmonary disease, unspecified: Secondary | ICD-10-CM | POA: Diagnosis not present

## 2014-07-10 DIAGNOSIS — F17200 Nicotine dependence, unspecified, uncomplicated: Secondary | ICD-10-CM | POA: Diagnosis present

## 2014-07-10 DIAGNOSIS — E559 Vitamin D deficiency, unspecified: Secondary | ICD-10-CM | POA: Diagnosis not present

## 2014-07-10 DIAGNOSIS — I959 Hypotension, unspecified: Secondary | ICD-10-CM | POA: Diagnosis not present

## 2014-07-10 DIAGNOSIS — I4510 Unspecified right bundle-branch block: Secondary | ICD-10-CM | POA: Insufficient documentation

## 2014-07-10 DIAGNOSIS — H409 Unspecified glaucoma: Secondary | ICD-10-CM | POA: Diagnosis not present

## 2014-07-10 DIAGNOSIS — Z923 Personal history of irradiation: Secondary | ICD-10-CM | POA: Diagnosis not present

## 2014-07-10 DIAGNOSIS — I739 Peripheral vascular disease, unspecified: Secondary | ICD-10-CM | POA: Insufficient documentation

## 2014-07-10 DIAGNOSIS — R040 Epistaxis: Secondary | ICD-10-CM | POA: Diagnosis present

## 2014-07-10 LAB — VITAMIN B12: Vitamin B-12: 406 pg/mL (ref 211–911)

## 2014-07-10 LAB — FOLATE: Folate: 9.3 ng/mL

## 2014-07-10 LAB — CBC WITH DIFFERENTIAL/PLATELET
BASOS PCT: 0 % (ref 0–1)
Basophils Absolute: 0 10*3/uL (ref 0.0–0.1)
Eosinophils Absolute: 0 10*3/uL (ref 0.0–0.7)
Eosinophils Relative: 0 % (ref 0–5)
HEMATOCRIT: 27.5 % — AB (ref 39.0–52.0)
HEMOGLOBIN: 8.6 g/dL — AB (ref 13.0–17.0)
LYMPHS ABS: 1.1 10*3/uL (ref 0.7–4.0)
LYMPHS PCT: 15 % (ref 12–46)
MCH: 22.2 pg — ABNORMAL LOW (ref 26.0–34.0)
MCHC: 31.3 g/dL (ref 30.0–36.0)
MCV: 70.9 fL — ABNORMAL LOW (ref 78.0–100.0)
MONO ABS: 0.4 10*3/uL (ref 0.1–1.0)
MONOS PCT: 5 % (ref 3–12)
NEUTROS ABS: 6.3 10*3/uL (ref 1.7–7.7)
Neutrophils Relative %: 80 % — ABNORMAL HIGH (ref 43–77)
Platelets: 210 10*3/uL (ref 150–400)
RBC: 3.88 MIL/uL — AB (ref 4.22–5.81)
RDW: 19.9 % — ABNORMAL HIGH (ref 11.5–15.5)
WBC: 7.8 10*3/uL (ref 4.0–10.5)

## 2014-07-10 LAB — BASIC METABOLIC PANEL
Anion gap: 11 (ref 5–15)
BUN: 30 mg/dL — AB (ref 6–23)
CHLORIDE: 106 meq/L (ref 96–112)
CO2: 22 meq/L (ref 19–32)
CREATININE: 0.95 mg/dL (ref 0.50–1.35)
Calcium: 8.4 mg/dL (ref 8.4–10.5)
GFR calc Af Amer: >90 mL/min (ref >90)
GFR calc non Af Amer: 78 mL/min — ABNORMAL LOW (ref >90)
GLUCOSE: 98 mg/dL (ref 70–99)
POTASSIUM: 4.6 meq/L (ref 3.7–5.3)
Sodium: 139 mEq/L (ref 137–147)

## 2014-07-10 LAB — IRON AND TIBC
IRON: 85 ug/dL (ref 42–135)
Saturation Ratios: 26 % (ref 20–55)
TIBC: 330 ug/dL (ref 215–435)
UIBC: 245 ug/dL (ref 125–400)

## 2014-07-10 LAB — PREPARE RBC (CROSSMATCH)

## 2014-07-10 LAB — ABO/RH: ABO/RH(D): A POS

## 2014-07-10 LAB — RETICULOCYTES
RBC.: 4.08 MIL/uL — ABNORMAL LOW (ref 4.22–5.81)
Retic Count, Absolute: 44.9 10*3/uL (ref 19.0–186.0)
Retic Ct Pct: 1.1 % (ref 0.4–3.1)

## 2014-07-10 LAB — FERRITIN: Ferritin: 10 ng/mL — ABNORMAL LOW (ref 22–322)

## 2014-07-10 MED ORDER — SODIUM CHLORIDE 0.9 % IJ SOLN
3.0000 mL | Freq: Two times a day (BID) | INTRAMUSCULAR | Status: DC
  Administered 2014-07-11 – 2014-07-13 (×5): 3 mL via INTRAVENOUS

## 2014-07-10 MED ORDER — BUDESONIDE-FORMOTEROL FUMARATE 160-4.5 MCG/ACT IN AERO
2.0000 | INHALATION_SPRAY | Freq: Two times a day (BID) | RESPIRATORY_TRACT | Status: DC
  Administered 2014-07-10 – 2014-07-13 (×6): 2 via RESPIRATORY_TRACT
  Filled 2014-07-10: qty 6

## 2014-07-10 MED ORDER — MORPHINE SULFATE 2 MG/ML IJ SOLN: 1.0000 mg | INTRAMUSCULAR | Status: DC | PRN

## 2014-07-10 MED ORDER — SIMVASTATIN 5 MG PO TABS
5.0000 mg | ORAL_TABLET | Freq: Every day | ORAL | Status: DC
  Administered 2014-07-11 – 2014-07-12 (×3): 5 mg via ORAL
  Filled 2014-07-10 (×6): qty 1

## 2014-07-10 MED ORDER — SODIUM CHLORIDE 0.9 % IV SOLN
INTRAVENOUS | Status: DC
  Administered 2014-07-10: 18:00:00 via INTRAVENOUS

## 2014-07-10 MED ORDER — ASPIRIN EC 81 MG PO TBEC
81.0000 mg | DELAYED_RELEASE_TABLET | Freq: Every day | ORAL | Status: DC
  Administered 2014-07-10 – 2014-07-13 (×4): 81 mg via ORAL
  Filled 2014-07-10 (×4): qty 1

## 2014-07-10 MED ORDER — OMEGA-3 FATTY ACIDS 1000 MG PO CAPS
2.0000 g | ORAL_CAPSULE | Freq: Every day | ORAL | Status: DC
Start: 2014-07-10 — End: 2014-07-11
  Administered 2014-07-10: 2 g via ORAL
  Filled 2014-07-10 (×2): qty 2

## 2014-07-10 MED ORDER — NITROGLYCERIN 0.4 MG SL SUBL: 0.4000 mg | SUBLINGUAL_TABLET | SUBLINGUAL | Status: DC | PRN

## 2014-07-10 MED ORDER — SODIUM CHLORIDE 0.9 % IV BOLUS (SEPSIS)
1000.0000 mL | Freq: Once | INTRAVENOUS | Status: AC
  Administered 2014-07-10: 1000 mL via INTRAVENOUS

## 2014-07-10 MED ORDER — ACETAMINOPHEN 650 MG RE SUPP: 650.0000 mg | Freq: Four times a day (QID) | RECTAL | Status: DC | PRN

## 2014-07-10 MED ORDER — METOPROLOL TARTRATE 25 MG PO TABS
25.0000 mg | ORAL_TABLET | Freq: Two times a day (BID) | ORAL | Status: DC
  Administered 2014-07-11: 25 mg via ORAL
  Filled 2014-07-10 (×3): qty 1

## 2014-07-10 MED ORDER — ERGOCALCIFEROL 1.25 MG (50000 UT) PO CAPS
50000.0000 [IU] | ORAL_CAPSULE | ORAL | Status: DC
  Administered 2014-07-13: 50000 [IU] via ORAL
  Filled 2014-07-10: qty 1

## 2014-07-10 MED ORDER — SALINE SPRAY 0.65 % NA SOLN: 1.0000 | Freq: Every day | NASAL | Status: DC | PRN

## 2014-07-10 MED ORDER — HYDROCODONE-ACETAMINOPHEN 5-325 MG PO TABS: 1.0000 | ORAL_TABLET | ORAL | Status: DC | PRN

## 2014-07-10 MED ORDER — SODIUM CHLORIDE 0.9 % IV SOLN
Freq: Once | INTRAVENOUS | Status: AC
  Administered 2014-07-10: 16:00:00 via INTRAVENOUS

## 2014-07-10 MED ORDER — SILVER NITRATE-POT NITRATE 75-25 % EX MISC
1.0000 "application " | Freq: Once | CUTANEOUS | Status: AC
  Administered 2014-07-10: 1 via TOPICAL
  Filled 2014-07-10: qty 1

## 2014-07-10 MED ORDER — ACETAMINOPHEN 325 MG PO TABS: 650.0000 mg | ORAL_TABLET | ORAL | Status: DC | PRN

## 2014-07-10 MED ORDER — OXYMETAZOLINE HCL 0.05 % NA SOLN
2.0000 | Freq: Once | NASAL | Status: AC
  Administered 2014-07-10: 2 via NASAL
  Filled 2014-07-10: qty 15

## 2014-07-10 MED ORDER — ISOSORBIDE MONONITRATE ER 30 MG PO TB24
30.0000 mg | ORAL_TABLET | Freq: Every day | ORAL | Status: DC
  Administered 2014-07-10 – 2014-07-11 (×2): 30 mg via ORAL
  Filled 2014-07-10 (×4): qty 1

## 2014-07-10 MED ORDER — ACETAMINOPHEN 325 MG PO TABS: 650.0000 mg | ORAL_TABLET | Freq: Four times a day (QID) | ORAL | Status: DC | PRN

## 2014-07-10 NOTE — ED Provider Notes (Signed)
CSN: [id number]     Arrival date & time 07/10/14  1135 History   First MD Initiated Contact with Patient 07/10/14 1215     Chief Complaint  Patient presents with  . Epistaxis     (Consider location/radiation/quality/duration/timing/severity/associated sxs/prior Treatment) HPI  Patient is here with his significant other. She reports he has had a nosebleed at least once a month if not more often for the past 26 years that she has known him. The last nosebleed was this morning about 9 AM. The bleeding was mainly from the right side and patient states the bleeding is always from the right side. She reports he was coughing up clots. They used Ocean saline in his nose which seemed to help. He started bleeding about 9 AM and it did not stop until about 11 AM. He reports he however is feeling weak today and having some difficulty walking. His spouse states the last time he had bleeding this bad was about a year ago. She states they have electric heat in the house. He has been followed by an ENT who told him to use the Ocean saline, the last time they were seeing was a year ago. They report they have electric heat. He has not had URI symptoms.  PCP Dr Johnnye Lana Cardiology Dr [name] ENT [facility] ENT  Past Medical History  Diagnosis Date  . Anxiety   . ED (erectile dysfunction)   . ETOH abuse     liver disease  . Hematospermia   . Prostate cancer     chemo/radiation 2000  . Ventral hernia   . COPD (chronic obstructive pulmonary disease)     smoker  . Vitamin D deficiency   . Tobacco abuse   . Coronary artery disease 10/14/12    50-60% LAD  . Moderate aortic insufficiency 10/11/11    NL LVF  . Glaucoma   . PVD (peripheral vascular disease) 10/14/12    dilated Ao root at cath  . Atrial fibrillation     NUC, 03/31/2009 - No ECG changes, EKG negative for ischemia, clinical correlation recommeneded, mild anteroapical ischemia  . Chronic atrial fibrillation     not on Coumadin secondary to GI  bleed  . RBBB   . Aortic valve disorder     2D ECHO, 11/18/2012 - EF 50-55%, normal   Past Surgical History  Procedure Laterality Date  . Ventral hernia repair    . Cardiac catheterization  10/14/2012    Tubular moderate lesion in the mid LAD, proceed with FFR interrogation to determine physiologic significance   Family History  Problem Relation Age of Onset  . Emphysema Brother   . Cancer Brother   . Allergies      whole family  . Diabetes Sister   . Cancer Sister   . Diabetes Child   . Diabetes Child   . Diabetes Child    History  Substance Use Topics  . Smoking status: Current Every Day Smoker -- 1.00 packs/day for 50 years    Types: Cigarettes  . Smokeless tobacco: Never Used     Comment: 30+ yr  . Alcohol Use: No  lives at home Lives with spouse  Review of Systems  All other systems reviewed and are negative.     Allergies  Review of patient's allergies indicates no known allergies.  Home Medications   Prior to Admission medications   Medication Sig Start Date End Date Taking? Authorizing Provider  acetaminophen (TYLENOL) 325 MG tablet Take 2 tablets (650  mg total) by mouth every 4 (four) hours as needed. 10/15/12   Erlene Quan, PA-C  aspirin EC 81 MG tablet Take 81 mg by mouth daily.    Historical Provider, MD  budesonide-formoterol (SYMBICORT) 160-4.5 MCG/ACT inhaler Inhale 2 puffs into the lungs 2 (two) times daily. 09/23/12   Brand Males, MD  buPROPion (WELLBUTRIN SR) 150 MG 12 hr tablet 150mg  x 3 days then increase to 150mg  twice a day x 12 weeks 02/25/13   Brand Males, MD  ergocalciferol (VITAMIN D2) 50000 UNITS capsule Take 50,000 Units by mouth 2 (two) times a week.    Historical Provider, MD  fish oil-omega-3 fatty acids 1000 MG capsule Take 2 g by mouth daily.    Historical Provider, MD  isosorbide mononitrate (IMDUR) 30 MG 24 hr tablet Take 1 tablet (30 mg total) by mouth daily. <Appointment 07/17/2014> 06/25/14   Lorretta Harp, MD   metoprolol tartrate (LOPRESSOR) 25 MG tablet Take 1 tablet (25 mg total) by mouth 2 (two) times daily. <Appointment 07/17/2014> 06/25/14   Lorretta Harp, MD  nitroGLYCERIN (NITROSTAT) 0.4 MG SL tablet Place 0.4 mg under the tongue every 5 (five) minutes as needed for chest pain.    Historical Provider, MD  simvastatin (ZOCOR) 5 MG tablet Take 1 tablet (5 mg total) by mouth at bedtime. MUST KEEP APPOINTMENT 07/17/14 WITH DR [name] FOR FUTURE REFILLS 06/23/14   Lorretta Harp, MD  sodium chloride (OCEAN) 0.65 % SOLN nasal spray Place 1 spray into the nose daily as needed (For nosebleeds.).     Historical Provider, MD  tiotropium (SPIRIVA) 18 MCG inhalation capsule Place 18 mcg into inhaler and inhale daily.    Historical Provider, MD  Travoprost, BAK Free, (TRAVATAN) 0.004 % SOLN ophthalmic solution Place 1 drop into both eyes at bedtime.    Historical Provider, MD   BP 104/52 mmHg  Pulse 90  Temp(Src) 98.2 F (36.8 C) (Oral)  Resp 16  SpO2 96%  Vital signs normal   Physical Exam  Constitutional: He is oriented to person, place, and time. He appears well-developed and well-nourished.  Non-toxic appearance. He does not appear ill. No distress.  HENT:  Head: Normocephalic and atraumatic.  Right Ear: External ear normal.  Left Ear: External ear normal.  Nose: Nose normal. No mucosal edema or rhinorrhea.  Mouth/Throat: Oropharynx is clear and moist and mucous membranes are normal. No dental abscesses or uvula swelling.  Dry tongue Patient has minimal amount of blood in his right nostril. There is an area on the superior anterior right septum that appears to be the source of bleeding.  Eyes: Conjunctivae and EOM are normal. Pupils are equal, round, and reactive to light.  Neck: Normal range of motion and full passive range of motion without pain. Neck supple.  Cardiovascular: Normal rate, regular rhythm and normal heart sounds.  Exam reveals no gallop and no friction rub.   No murmur  heard. Pulmonary/Chest: Effort normal and breath sounds normal. No respiratory distress. He has no wheezes. He has no rhonchi. He has no rales. He exhibits no tenderness and no crepitus.  Abdominal: Soft. Normal appearance and bowel sounds are normal. He exhibits no distension. There is no tenderness. There is no rebound and no guarding.  Musculoskeletal: Normal range of motion. He exhibits no edema or tenderness.  Moves all extremities well.   Neurological: He is alert and oriented to person, place, and time. He has normal strength. No cranial nerve deficit.  Skin: Skin  is warm, dry and intact. No rash noted. No erythema. No pallor.  Psychiatric: He has a normal mood and affect. His speech is normal and behavior is normal. His mood appears not anxious.  Nursing note and vitals reviewed.   ED Course  Procedures (including critical care time)  Medications  0.9 %  sodium chloride infusion (not administered)  oxymetazoline (AFRIN) 0.05 % nasal spray 2 spray (2 sprays Each Nare Given 07/10/14 1317)  sodium chloride 0.9 % bolus 1,000 mL (0 mLs Intravenous Stopped 07/10/14 1526)  silver nitrate applicators applicator 1 application (1 application Topical Given 07/10/14 1517)    Patient presented with hypotension with a blood pressure 75/48. His hypotension responded to 1 L normal saline. His right anterior nasal septum was cauterized with a silver nitrate applicator by myself. We discussed his lab work. Patient is agreeable to be admitted for blood transfusion and to make sure his blood pressure is stabilized and his epistaxis has resolved. 2 units of PRC's were ordered, orders for one to be transfused this afternoon.  15:29 Dr Hartford Poli, admit to observation, tele    Labs Review Results for orders placed or performed during the hospital encounter of 07/10/14  CBC with Differential  Result Value Ref Range   WBC 7.8 4.0 - 10.5 K/uL   RBC 3.88 (L) 4.22 - 5.81 MIL/uL   Hemoglobin 8.6 (L) 13.0 -  17.0 g/dL   HCT 27.5 (L) 39.0 - 52.0 %   MCV 70.9 (L) 78.0 - 100.0 fL   MCH 22.2 (L) 26.0 - 34.0 pg   MCHC 31.3 30.0 - 36.0 g/dL   RDW 19.9 (H) 11.5 - 15.5 %   Platelets 210 150 - 400 K/uL   Neutrophils Relative % 80 (H) 43 - 77 %   Neutro Abs 6.3 1.7 - 7.7 K/uL   Lymphocytes Relative 15 12 - 46 %   Lymphs Abs 1.1 0.7 - 4.0 K/uL   Monocytes Relative 5 3 - 12 %   Monocytes Absolute 0.4 0.1 - 1.0 K/uL   Eosinophils Relative 0 0 - 5 %   Eosinophils Absolute 0.0 0.0 - 0.7 K/uL   Basophils Relative 0 0 - 1 %   Basophils Absolute 0.0 0.0 - 0.1 K/uL  Basic metabolic panel  Result Value Ref Range   Sodium 139 137 - 147 mEq/L   Potassium 4.6 3.7 - 5.3 mEq/L   Chloride 106 96 - 112 mEq/L   CO2 22 19 - 32 mEq/L   Glucose, Bld 98 70 - 99 mg/dL   BUN 30 (H) 6 - 23 mg/dL   Creatinine, Ser 0.95 0.50 - 1.35 mg/dL   Calcium 8.4 8.4 - 10.5 mg/dL   GFR calc non Af Amer 78 (L) >90 mL/min   GFR calc Af Amer >90 >90 mL/min   Anion gap 11 5 - 15   Laboratory interpretation all normal except anemia, his hemoglobin was 11.7 in January 2013, it was 13.4 in July 2010.    Imaging Review No results found.   EKG Interpretation None      MDM   Final diagnoses:  Anemia, unspecified anemia type  Anterior epistaxis  Other specified hypotension   Plan admission  Rolland Porter, MD, FACEP   CRITICAL CARE Performed by: Rolland Porter L Total critical care time: 32 min Critical care time was exclusive of separately billable procedures and treating other patients. Critical care was necessary to treat or prevent imminent or life-threatening deterioration. Critical care was time spent personally by  me on the following activities: development of treatment plan with patient and/or surrogate as well as nursing, discussions with consultants, evaluation of patient's response to treatment, examination of patient, obtaining history from patient or surrogate, ordering and performing treatments and interventions,  ordering and review of laboratory studies, ordering and review of radiographic studies, pulse oximetry and re-evaluation of patient's condition.     Janice Norrie, MD 07/10/14 1539

## 2014-07-10 NOTE — Progress Notes (Signed)
Blood transfusion stopped at 2051 but after entering volume of blood transfused, it reactivated the column. For informational purposes.    Patient tolerated the blood without any adverse signs or symptoms. Will continue to monitor patient to end of shift.

## 2014-07-10 NOTE — Progress Notes (Signed)
Report given to receiving RN. Patient in bed resting watching TV, with family at bedside. No signs of distress or discomfort noted.

## 2014-07-10 NOTE — ED Notes (Signed)
Pt reports nosebleed from right nare since 0800. Reports large amount of bleeding pta, bleeding controlled at this time, denies blood thinners.

## 2014-07-10 NOTE — H&P (Signed)
[facility] History and Physical  Mitchell Iwanicki VOZ:[id number] DOB: 1934/09/17 DOA: 07/10/2014  Referring physician: Dr. Eliane Decree PCP: Maximino Greenland, MD   Chief Complaint: dizziness/anemia  HPI: Mitchell Wood is a 79 y.o. male with past medical history of COPD, atrial fibrillation not on anticoagulant ablation because of history of GI bleed, remote history of alcohol abuse and prostate cancer. Patient came into the hospital because of epistaxis. Patient is a poor historian, wife at bedside so most of the history was obtained from her, she said he gets nosebleed about every month, earlier today after he woke up from sleep he was in his usual state of health and started to have blood gushing from the right nostril, the bleeding was very significant to the point it failed 2 small trash cans, and she almost used 4 kitchen napkin rolls, when she was trying to stop the bleeding. Patient got dizzy when he was trying to go to the bathroom. In the ED he was found to have hemoglobin of 8.6, and fortunately last hemoglobin was from January 2013 which is 11.7. Patient also has microcytosis with MCV of 71. Patient was hypoxic with blood pressure of 86/39 on admission, this is corrected after 1 L of normal saline. Patient admitted to the hospital for further evaluation.  Review of Systems:  Constitutional: negative for anorexia, fevers and sweats Eyes: negative for irritation, redness and visual disturbance Ears, nose, mouth, throat, and face: had severe epistaxis Respiratory: negative for cough, dyspnea on exertion, sputum and wheezing Cardiovascular: negative for chest pain, dyspnea, lower extremity edema, orthopnea, palpitations and syncope Gastrointestinal: negative for abdominal pain, constipation, diarrhea, melena, nausea and vomiting Genitourinary:negative for dysuria, frequency and hematuria Hematologic/lymphatic: negative for bleeding, easy bruising and lymphadenopathy Musculoskeletal:negative  for arthralgias, muscle weakness and stiff joints Neurological: had dizziness with walking earlier today Endocrine: negative for diabetic symptoms including polydipsia, polyuria and weight loss Allergic/Immunologic: negative for anaphylaxis, hay fever and urticaria  Past Medical History  Diagnosis Date  . Anxiety   . ED (erectile dysfunction)   . ETOH abuse     liver disease  . Hematospermia   . Prostate cancer     chemo/radiation 2000  . Ventral hernia   . COPD (chronic obstructive pulmonary disease)     smoker  . Vitamin D deficiency   . Tobacco abuse   . Coronary artery disease 10/14/12    50-60% LAD  . Moderate aortic insufficiency 10/11/11    NL LVF  . Glaucoma   . PVD (peripheral vascular disease) 10/14/12    dilated Ao root at cath  . Atrial fibrillation     NUC, 03/31/2009 - No ECG changes, EKG negative for ischemia, clinical correlation recommeneded, mild anteroapical ischemia  . Chronic atrial fibrillation     not on Coumadin secondary to GI bleed  . RBBB   . Aortic valve disorder     2D ECHO, 11/18/2012 - EF 50-55%, normal   Past Surgical History  Procedure Laterality Date  . Ventral hernia repair    . Cardiac catheterization  10/14/2012    Tubular moderate lesion in the mid LAD, proceed with FFR interrogation to determine physiologic significance   Social History:   reports that he has been smoking Cigarettes.  He has a 50 pack-year smoking history. He has never used smokeless tobacco. He reports that he does not drink alcohol. His drug history is not on file.  No Known Allergies  Family History  Problem Relation Age of Onset  .  Emphysema Brother   . Cancer Brother   . Allergies      whole family  . Diabetes Sister   . Cancer Sister   . Diabetes Child   . Diabetes Child   . Diabetes Child      Prior to Admission medications   Medication Sig Start Date End Date Taking? Authorizing Provider  acetaminophen (TYLENOL) 325 MG tablet Take 2 tablets (650 mg  total) by mouth every 4 (four) hours as needed. 10/15/12  Yes Erlene Quan, PA-C  aspirin EC 81 MG tablet Take 81 mg by mouth daily.   Yes Historical Provider, MD  budesonide-formoterol (SYMBICORT) 160-4.5 MCG/ACT inhaler Inhale 2 puffs into the lungs 2 (two) times daily. 09/23/12  Yes Brand Males, MD  ergocalciferol (VITAMIN D2) 50000 UNITS capsule Take 50,000 Units by mouth 2 (two) times a week.   Yes Historical Provider, MD  fish oil-omega-3 fatty acids 1000 MG capsule Take 2 g by mouth daily.   Yes Historical Provider, MD  isosorbide mononitrate (IMDUR) 30 MG 24 hr tablet Take 1 tablet (30 mg total) by mouth daily. <Appointment 07/17/2014> 06/25/14  Yes Lorretta Harp, MD  metoprolol tartrate (LOPRESSOR) 25 MG tablet Take 1 tablet (25 mg total) by mouth 2 (two) times daily. <Appointment 07/17/2014> 06/25/14  Yes Lorretta Harp, MD  nitroGLYCERIN (NITROSTAT) 0.4 MG SL tablet Place 0.4 mg under the tongue every 5 (five) minutes as needed for chest pain.   Yes Historical Provider, MD  simvastatin (ZOCOR) 5 MG tablet Take 1 tablet (5 mg total) by mouth at bedtime. MUST KEEP APPOINTMENT 07/17/14 WITH DR [name] FOR FUTURE REFILLS 06/23/14  Yes Lorretta Harp, MD  sodium chloride (OCEAN) 0.65 % SOLN nasal spray Place 1 spray into the nose daily as needed (For nosebleeds.).    Yes Historical Provider, MD  buPROPion (WELLBUTRIN SR) 150 MG 12 hr tablet 150mg  x 3 days then increase to 150mg  twice a day x 12 weeks Patient not taking: Reported on 07/10/2014 02/25/13   Brand Males, MD   Physical Exam: Filed Vitals:   07/10/14 1600  BP: 118/66  Pulse: 81  Temp:   Resp: 20   Constitutional: Oriented to person, place, and time. Well-developed and well-nourished. Cooperative.  Head: Normocephalic and atraumatic.  Nose: Nose normal.  Mouth/Throat: Uvula is midline, oropharynx is clear and moist and mucous membranes are normal.  Eyes: Conjunctivae and EOM are normal. Pupils are equal, round, and  reactive to light.  Neck: Trachea normal and normal range of motion. Neck supple.  Cardiovascular: Normal rate, regular rhythm, S1 normal, S2 normal, normal heart sounds and intact distal pulses.   Pulmonary/Chest: Effort normal and breath sounds normal.  Abdominal: Soft. Bowel sounds are normal. There is no hepatosplenomegaly. There is no tenderness.  Musculoskeletal: Normal range of motion.  Neurological: Alert and oriented to person, place, and time. Has normal strength. No cranial nerve deficit or sensory deficit.  Skin: Skin is warm, dry and intact.  Psychiatric: Has a normal mood and affect. Speech is normal and behavior is normal.   Labs on Admission:  Basic Metabolic Panel:  Recent Labs Lab 07/10/14 1355  NA 139  K 4.6  CL 106  CO2 22  GLUCOSE 98  BUN 30*  CREATININE 0.95  CALCIUM 8.4   Liver Function Tests: No results for input(s): AST, ALT, ALKPHOS, BILITOT, PROT, ALBUMIN in the last 168 hours. No results for input(s): LIPASE, AMYLASE in the last 168 hours. No results for  input(s): AMMONIA in the last 168 hours. CBC:  Recent Labs Lab 07/10/14 1355  WBC 7.8  NEUTROABS 6.3  HGB 8.6*  HCT 27.5*  MCV 70.9*  PLT 210   Cardiac Enzymes: No results for input(s): CKTOTAL, CKMB, CKMBINDEX, TROPONINI in the last 168 hours.  BNP (last 3 results) No results for input(s): PROBNP in the last 8760 hours. CBG: No results for input(s): GLUCAP in the last 168 hours.  Radiological Exams on Admission: No results found.  EKG: Independently reviewed.   Assessment/Plan Principal Problem:   Anemia due to acute blood loss Active Problems:   Smoker   COPD (chronic obstructive pulmonary disease)   ILD (interstitial lung disease)   Chronic atrial fibrillation, not on Coumadin secondary to history of GI bleeding   Frequent epistaxis    Anemia Likely anemia of acute blood loss, although it's unlikely but wife reported that he filled 2 small trash cans. Patient has  microcytosis he had a history of GI bleed, currently him and his wife denied any dark stools. I will not check stools for Hemoccult as probably it will be positive if he swallowed any blood from his epistaxis. Check ferritin and iron to see if there is any component of chronic blood loss. Transfuse 1 units of packed RBCs, for his symptomatic anemia, check CBC in the morning.  Epistaxis, only from the right nostril Patient wife reported that he gets epistaxis almost every month, this time bleeding was very significant. Reported that he filled 2 small trash cans, what appears to be significant bleeding. Epistaxis controlled in the ED with silver nitrate application. No bleeding currently. Patient have seen Dr. Wilburn Cornelia before, likely can be referred to him as outpatient.  Hypotension Likely secondary to acute blood loss, hemorrhagic shock. This is corrected with IV fluids, resolved.  Chronic atrial fibrillation Not on Coumadin because of history of GI bleed. EKG not checked but patient is on metoprolol for rate control and aspirin.  COPD/ILD Current smoker, denies any recent shortness of breath, seems to be stable. Patient follows with Dr. Chase Caller, NOT on home oxygen.  Code Status: full code Family Communication: plan discussed with the patient in the presence of his wife and his son at bedside. Disposition Plan: observation, telemetry. If blood pressure and Hb are OK can be discharged in a.m.  Time spent: 70 minutes  [facility] Pager [phone]

## 2014-07-11 DIAGNOSIS — D62 Acute posthemorrhagic anemia: Secondary | ICD-10-CM | POA: Diagnosis not present

## 2014-07-11 DIAGNOSIS — I2510 Atherosclerotic heart disease of native coronary artery without angina pectoris: Secondary | ICD-10-CM | POA: Diagnosis not present

## 2014-07-11 DIAGNOSIS — J449 Chronic obstructive pulmonary disease, unspecified: Secondary | ICD-10-CM | POA: Diagnosis not present

## 2014-07-11 DIAGNOSIS — I472 Ventricular tachycardia: Secondary | ICD-10-CM

## 2014-07-11 DIAGNOSIS — R040 Epistaxis: Secondary | ICD-10-CM | POA: Diagnosis not present

## 2014-07-11 LAB — BASIC METABOLIC PANEL
Anion gap: 13 (ref 5–15)
BUN: 27 mg/dL — ABNORMAL HIGH (ref 6–23)
CALCIUM: 8.3 mg/dL — AB (ref 8.4–10.5)
CO2: 19 mEq/L (ref 19–32)
CREATININE: 0.83 mg/dL (ref 0.50–1.35)
Chloride: 106 mEq/L (ref 96–112)
GFR calc Af Amer: >90 mL/min (ref >90)
GFR, EST NON AFRICAN AMERICAN: 83 mL/min — AB (ref >90)
GLUCOSE: 85 mg/dL (ref 70–99)
Potassium: 4.1 mEq/L (ref 3.7–5.3)
SODIUM: 138 meq/L (ref 137–147)

## 2014-07-11 LAB — PROTIME-INR
INR: 1.24 (ref 0.00–1.49)
Prothrombin Time: 15.8 seconds — ABNORMAL HIGH (ref 11.6–15.2)

## 2014-07-11 LAB — CBC
HCT: 27.7 % — ABNORMAL LOW (ref 39.0–52.0)
Hemoglobin: 8.9 g/dL — ABNORMAL LOW (ref 13.0–17.0)
MCH: 22.9 pg — ABNORMAL LOW (ref 26.0–34.0)
MCHC: 32.1 g/dL (ref 30.0–36.0)
MCV: 71.4 fL — ABNORMAL LOW (ref 78.0–100.0)
PLATELETS: 187 10*3/uL (ref 150–400)
RBC: 3.88 MIL/uL — ABNORMAL LOW (ref 4.22–5.81)
RDW: 21 % — AB (ref 11.5–15.5)
WBC: 7.2 10*3/uL (ref 4.0–10.5)

## 2014-07-11 LAB — TROPONIN I
Troponin I: <0.3 ng/mL (ref <0.30)
Troponin I: <0.3 ng/mL (ref <0.30)

## 2014-07-11 LAB — MAGNESIUM: Magnesium: 2 mg/dL (ref 1.5–2.5)

## 2014-07-11 LAB — TSH: TSH: 1.28 u[IU]/mL (ref 0.350–4.500)

## 2014-07-11 MED ORDER — METOPROLOL TARTRATE 50 MG PO TABS
50.0000 mg | ORAL_TABLET | Freq: Two times a day (BID) | ORAL | Status: DC
  Administered 2014-07-11 (×2): 50 mg via ORAL
  Filled 2014-07-11 (×4): qty 1

## 2014-07-11 MED ORDER — OMEGA-3-ACID ETHYL ESTERS 1 G PO CAPS
2.0000 g | ORAL_CAPSULE | Freq: Every day | ORAL | Status: DC
  Administered 2014-07-11 – 2014-07-13 (×3): 2 g via ORAL
  Filled 2014-07-11 (×3): qty 2

## 2014-07-11 MED ORDER — METOPROLOL TARTRATE 50 MG PO TABS
50.0000 mg | ORAL_TABLET | Freq: Two times a day (BID) | ORAL | Status: DC
  Filled 2014-07-11 (×2): qty 1

## 2014-07-11 MED ORDER — MAGNESIUM CITRATE PO SOLN
1.0000 | Freq: Once | ORAL | Status: AC
  Administered 2014-07-11: 1 via ORAL
  Filled 2014-07-11 (×2): qty 296

## 2014-07-11 NOTE — Progress Notes (Signed)
[facility] PROGRESS NOTE  Mitchell Wood NIO:[id number] DOB: November 12, 1934 DOA: 07/10/2014 PCP: Maximino Greenland, MD  Assessment/Plan: 1. Acute blood loss anemia 1. Likely secondary to epistaxis 2. No further episodes of nose bleeding overnight reported 3. Hgb appears stable 4. Cont monitor cbc closely 2. Hypotension 1. Likely secondary to acute blood loss anemia 3. Chronic afib 1. Had been rate controlled this AM 2. Noted to have brief episode of tachycardia, seems to be wide complex, but difficult to tell given some motion artifact 3. Follow up EKG unremarkable and rate controlled 4. Will follow serial CE, obtain 2d echo, and follow lytes 5. Will increase metoprolol as tolerated to 50mg  bid from home 25mg  dose 4. COPD 1. No active wheezing on exam 2. monitor 5. DVT prophylaxis 1. SCD's  Code Status: full Family Communication: pt in room (indicate person spoken with, relationship, and if by phone, the number) Disposition Plan: pending   HPI/Subjective: Eager to go home. Brief episode of wide complex tachycardia noted this AM.  Objective: Filed Vitals:   07/11/14 0616 07/11/14 1230 07/11/14 1328 07/11/14 1413  BP: 113/62 115/78  104/52  Pulse: 76 90  90  Temp: 98 F (36.7 C)   97.6 F (36.4 C)  TempSrc: Oral   Oral  Resp: 18   20  Height:      Weight: 86.274 kg (190 lb 3.2 oz)     SpO2: 98%  91% 97%    Intake/Output Summary (Last 24 hours) at 07/11/14 1544 Last data filed at 07/11/14 1323  Gross per 24 hour  Intake   1897 ml  Output   1026 ml  Net    871 ml   Filed Weights   07/10/14 1651 07/11/14 0616  Weight: 86.3 kg (190 lb 4.1 oz) 86.274 kg (190 lb 3.2 oz)    Exam:   General:  Awake, in nad  Cardiovascular: regular, s1, s2  Respiratory: normal resp effort, no wheezing  Abdomen: soft,nondistended  Musculoskeletal: perfused, no clubbing   Data Reviewed: Basic Metabolic Panel:  Recent Labs Lab 07/10/14 1355 07/11/14 0454 07/11/14 1226   NA 139 138  --   K 4.6 4.1  --   CL 106 106  --   CO2 22 19  --   GLUCOSE 98 85  --   BUN 30* 27*  --   CREATININE 0.95 0.83  --   CALCIUM 8.4 8.3*  --   MG  --   --  2.0   Liver Function Tests: No results for input(s): AST, ALT, ALKPHOS, BILITOT, PROT, ALBUMIN in the last 168 hours. No results for input(s): LIPASE, AMYLASE in the last 168 hours. No results for input(s): AMMONIA in the last 168 hours. CBC:  Recent Labs Lab 07/10/14 1355 07/11/14 0454  WBC 7.8 7.2  NEUTROABS 6.3  --   HGB 8.6* 8.9*  HCT 27.5* 27.7*  MCV 70.9* 71.4*  PLT 210 187   Cardiac Enzymes:  Recent Labs Lab 07/11/14 1226  TROPONINI <0.30   BNP (last 3 results) No results for input(s): PROBNP in the last 8760 hours. CBG: No results for input(s): GLUCAP in the last 168 hours.  No results found for this or any previous visit (from the past 240 hour(s)).   Studies: No results found.  Scheduled Meds: . aspirin EC  81 mg Oral Daily  . budesonide-formoterol  2 puff Inhalation BID  . [START ON 07/13/2014] ergocalciferol  50,000 Units Oral Once per day on Mon Thu  . isosorbide  mononitrate  30 mg Oral Daily  . metoprolol tartrate  50 mg Oral BID  . omega-3 acid ethyl esters  2 g Oral Daily  . simvastatin  5 mg Oral QHS  . sodium chloride  3 mL Intravenous Q12H   Continuous Infusions:   Principal Problem:   Anemia due to acute blood loss Active Problems:   Smoker   COPD (chronic obstructive pulmonary disease)   ILD (interstitial lung disease)   Chronic atrial fibrillation, not on Coumadin secondary to history of GI bleeding   Frequent epistaxis    Time spent: 85min    [facility], [facility] Pager [phone]. If 7PM-7AM, please contact night-coverage at [email or web address], password [facility] 07/11/2014, 3:44 PM  LOS: 1 day

## 2014-07-11 NOTE — Progress Notes (Signed)
UR completed 

## 2014-07-11 NOTE — Progress Notes (Addendum)
Pt had 9-10 beats of VT & HR >144.  Pt asymptomatic  Dr. Earlie Counts informed and order EKG and echo and cardiac enzymes.  Will continue to monitor.  Karie Kirks, Therapist, sports.

## 2014-07-11 NOTE — Plan of Care (Signed)
Problem: Phase I Progression Outcomes Goal: Pain controlled with appropriate interventions Outcome: Completed/Met Date Met:  07/11/14 Goal: OOB as tolerated unless otherwise ordered Outcome: Progressing Goal: Initial discharge plan identified Outcome: Progressing Goal: Voiding-avoid urinary catheter unless indicated Outcome: Completed/Met Date Met:  07/11/14     

## 2014-07-12 DIAGNOSIS — I359 Nonrheumatic aortic valve disorder, unspecified: Secondary | ICD-10-CM

## 2014-07-12 LAB — BASIC METABOLIC PANEL
ANION GAP: 9 (ref 5–15)
BUN: 24 mg/dL — ABNORMAL HIGH (ref 6–23)
CHLORIDE: 104 meq/L (ref 96–112)
CO2: 25 mEq/L (ref 19–32)
Calcium: 8.8 mg/dL (ref 8.4–10.5)
Creatinine, Ser: 1.01 mg/dL (ref 0.50–1.35)
GFR calc Af Amer: 81 mL/min — ABNORMAL LOW (ref >90)
GFR calc non Af Amer: 70 mL/min — ABNORMAL LOW (ref >90)
GLUCOSE: 102 mg/dL — AB (ref 70–99)
POTASSIUM: 4.3 meq/L (ref 3.7–5.3)
Sodium: 138 mEq/L (ref 137–147)

## 2014-07-12 LAB — CBC
HEMATOCRIT: 28 % — AB (ref 39.0–52.0)
Hemoglobin: 8.9 g/dL — ABNORMAL LOW (ref 13.0–17.0)
MCH: 22.9 pg — ABNORMAL LOW (ref 26.0–34.0)
MCHC: 31.8 g/dL (ref 30.0–36.0)
MCV: 72.2 fL — AB (ref 78.0–100.0)
PLATELETS: 189 10*3/uL (ref 150–400)
RBC: 3.88 MIL/uL — AB (ref 4.22–5.81)
RDW: 21.2 % — ABNORMAL HIGH (ref 11.5–15.5)
WBC: 7.9 10*3/uL (ref 4.0–10.5)

## 2014-07-12 LAB — TROPONIN I: Troponin I: <0.3 ng/mL (ref <0.30)

## 2014-07-12 MED ORDER — DOCUSATE SODIUM 100 MG PO CAPS
100.0000 mg | ORAL_CAPSULE | Freq: Two times a day (BID) | ORAL | Status: DC
  Administered 2014-07-12 – 2014-07-13 (×2): 100 mg via ORAL
  Filled 2014-07-12 (×3): qty 1

## 2014-07-12 MED ORDER — FERROUS SULFATE 325 (65 FE) MG PO TABS
325.0000 mg | ORAL_TABLET | Freq: Every day | ORAL | Status: DC
  Administered 2014-07-13: 325 mg via ORAL
  Filled 2014-07-12 (×3): qty 1

## 2014-07-12 MED ORDER — METOPROLOL TARTRATE 25 MG PO TABS
25.0000 mg | ORAL_TABLET | Freq: Two times a day (BID) | ORAL | Status: DC
  Administered 2014-07-12 – 2014-07-13 (×2): 25 mg via ORAL
  Filled 2014-07-12 (×4): qty 1

## 2014-07-12 NOTE — Progress Notes (Signed)
Echo Lab  2D Echocardiogram completed.  [place], RDCS 07/12/2014 3:00 PM

## 2014-07-12 NOTE — Progress Notes (Signed)
On routine check of blood pressure it was 86/30. Pt feeling "lazy" but otherwise ok. Dr. Earlie Counts on floor and notified. He stated it was ok to hold am lopressor and Imdur. Will closely monitor and do frequent bp checks

## 2014-07-12 NOTE — Progress Notes (Signed)
Patient is sleeping peacefully. Maintained on telemetry and is A-fib with a heart rate of 88 on the monitor. Will continue to monitor.  Esperanza Heir, RN

## 2014-07-12 NOTE — Progress Notes (Signed)
Patient's heart rate has dropped to the low 30s a couple of times throughout the shift. Each time this has occurred patient has been asymptomatic. NP Kathline Magic made aware. Patient continues to be in A-fib on the monitor. Heart rate is now in the 80s and BP is 104/73. Will continue to monitor.  Esperanza Heir, RN

## 2014-07-12 NOTE — Progress Notes (Signed)
[facility] PROGRESS NOTE  Tarry Fountain QBH:[id number] DOB: 1935-03-02 DOA: 07/10/2014 PCP: Maximino Greenland, MD  Assessment/Plan: 1. Acute blood loss anemia 1. Likely secondary to epistaxis 2. No further episodes of nose bleeding overnight reported 3. Hgb appears stable 4. Cont monitor cbc closely 2. Hypotension 1. Likely secondary to acute blood loss anemia 3. Chronic afib 1. Overall rate controlled 2. Noted to have brief episode of tachycardia on 11/14, seems to be wide complex, but difficult to tell given some motion artifact 3. Follow up EKG unremarkable and rate controlled 4. Serial trop neg 5. 2d echo done, pending results 6. Initially increased metoprolol to 50mg  bid from home 25mg  dose, however pt became bradycardic overnight with HR in the 30's and mildly hypotensive in the AM 7. Will resume home dose metoprolol of 25mg  bid 8. Monitor 4. COPD 1. No active wheezing on exam 2. monitor 5. DVT prophylaxis 1. SCD's  Code Status: full Family Communication: pt in room Disposition Plan: pending   HPI/Subjective: Pt remains eager to go home  Objective: Filed Vitals:   07/12/14 0915 07/12/14 0930 07/12/14 1035 07/12/14 1447  BP: 86/30  92/42 102/52  Pulse:    80  Temp:    97.9 F (36.6 C)  TempSrc:    Oral  Resp:    20  Height:      Weight:      SpO2:  96%  96%    Intake/Output Summary (Last 24 hours) at 07/12/14 1538 Last data filed at 07/12/14 1447  Gross per 24 hour  Intake    840 ml  Output    600 ml  Net    240 ml   Filed Weights   07/10/14 1651 07/11/14 0616 07/12/14 0417  Weight: 86.3 kg (190 lb 4.1 oz) 86.274 kg (190 lb 3.2 oz) 85.73 kg (189 lb)    Exam:   General:  Awake, in nad  Cardiovascular: regular, s1, s2  Respiratory: normal resp effort, no wheezing  Abdomen: soft,nondistended  Musculoskeletal: perfused, no clubbing   Data Reviewed: Basic Metabolic Panel:  Recent Labs Lab 07/10/14 1355 07/11/14 0454 07/11/14 1226  07/12/14 0024  NA 139 138  --  138  K 4.6 4.1  --  4.3  CL 106 106  --  104  CO2 22 19  --  25  GLUCOSE 98 85  --  102*  BUN 30* 27*  --  24*  CREATININE 0.95 0.83  --  1.01  CALCIUM 8.4 8.3*  --  8.8  MG  --   --  2.0  --    Liver Function Tests: No results for input(s): AST, ALT, ALKPHOS, BILITOT, PROT, ALBUMIN in the last 168 hours. No results for input(s): LIPASE, AMYLASE in the last 168 hours. No results for input(s): AMMONIA in the last 168 hours. CBC:  Recent Labs Lab 07/10/14 1355 07/11/14 0454 07/12/14 0024  WBC 7.8 7.2 7.9  NEUTROABS 6.3  --   --   HGB 8.6* 8.9* 8.9*  HCT 27.5* 27.7* 28.0*  MCV 70.9* 71.4* 72.2*  PLT 210 187 189   Cardiac Enzymes:  Recent Labs Lab 07/11/14 0024 07/11/14 1226 07/11/14 1825  TROPONINI <0.30 <0.30 <0.30   BNP (last 3 results) No results for input(s): PROBNP in the last 8760 hours. CBG: No results for input(s): GLUCAP in the last 168 hours.  No results found for this or any previous visit (from the past 240 hour(s)).   Studies: No results found.  Scheduled Meds: .  aspirin EC  81 mg Oral Daily  . budesonide-formoterol  2 puff Inhalation BID  . docusate sodium  100 mg Oral BID  . [START ON 07/13/2014] ergocalciferol  50,000 Units Oral Once per day on Mon Thu  . [START ON 07/13/2014] ferrous sulfate  325 mg Oral Q breakfast  . isosorbide mononitrate  30 mg Oral Daily  . metoprolol tartrate  25 mg Oral BID  . omega-3 acid ethyl esters  2 g Oral Daily  . simvastatin  5 mg Oral QHS  . sodium chloride  3 mL Intravenous Q12H   Continuous Infusions:   Principal Problem:   Anemia due to acute blood loss Active Problems:   Smoker   COPD (chronic obstructive pulmonary disease)   ILD (interstitial lung disease)   Chronic atrial fibrillation, not on Coumadin secondary to history of GI bleeding   Frequent epistaxis    Time spent: 22min    Antasia Haider, [facility] Pager [phone]. If 7PM-7AM, please  contact night-coverage at [email or web address], password [email or web address] 07/12/2014, 3:38 PM  LOS: 2 days

## 2014-07-12 NOTE — Progress Notes (Signed)
Utilization Review Completed.   Fairley Copher, RN, BSN Nurse Case Manager  

## 2014-07-13 LAB — CBC
HEMATOCRIT: 28.2 % — AB (ref 39.0–52.0)
HEMOGLOBIN: 9 g/dL — AB (ref 13.0–17.0)
MCH: 23 pg — ABNORMAL LOW (ref 26.0–34.0)
MCHC: 31.9 g/dL (ref 30.0–36.0)
MCV: 72.1 fL — ABNORMAL LOW (ref 78.0–100.0)
Platelets: 192 10*3/uL (ref 150–400)
RBC: 3.91 MIL/uL — ABNORMAL LOW (ref 4.22–5.81)
RDW: 21.9 % — ABNORMAL HIGH (ref 11.5–15.5)
WBC: 7.4 10*3/uL (ref 4.0–10.5)

## 2014-07-13 LAB — BASIC METABOLIC PANEL
ANION GAP: 13 (ref 5–15)
BUN: 17 mg/dL (ref 6–23)
CO2: 22 meq/L (ref 19–32)
CREATININE: 0.86 mg/dL (ref 0.50–1.35)
Calcium: 8.7 mg/dL (ref 8.4–10.5)
Chloride: 104 mEq/L (ref 96–112)
GFR calc Af Amer: >90 mL/min (ref >90)
GFR calc non Af Amer: 82 mL/min — ABNORMAL LOW (ref >90)
Glucose, Bld: 96 mg/dL (ref 70–99)
Potassium: 4.1 mEq/L (ref 3.7–5.3)
Sodium: 139 mEq/L (ref 137–147)

## 2014-07-13 MED ORDER — ISOSORBIDE MONONITRATE 15 MG HALF TABLET: 15.0000 mg | ORAL_TABLET | Freq: Every day | ORAL | Status: DC

## 2014-07-13 MED ORDER — FERROUS SULFATE 325 (65 FE) MG PO TABS: 325.0000 mg | ORAL_TABLET | Freq: Every day | ORAL | Status: AC

## 2014-07-13 MED ORDER — DSS 100 MG PO CAPS: 100.0000 mg | ORAL_CAPSULE | Freq: Two times a day (BID) | ORAL | Status: AC

## 2014-07-13 NOTE — Discharge Summary (Signed)
Physician Discharge Summary  Mitchell Wood GMW:[id number] DOB: 03-31-1935 DOA: 07/10/2014  PCP: Maximino Greenland, MD  Admit date: 07/10/2014 Discharge date: 07/13/2014  Time spent: 35 minutes  Recommendations for Outpatient Follow-up:  1. Follow up with PCP 1-2 weeks 2. Would repeat cbc in 1-2 weeks  Discharge Diagnoses:  Principal Problem:   Anemia due to acute blood loss Active Problems:   Smoker   COPD (chronic obstructive pulmonary disease)   ILD (interstitial lung disease)   Chronic atrial fibrillation, not on Coumadin secondary to history of GI bleeding   Frequent epistaxis   Discharge Condition: Stable  Diet recommendation: Heart healthy diet  Filed Weights   07/11/14 0616 07/12/14 0417 07/13/14 0537  Weight: 86.274 kg (190 lb 3.2 oz) 85.73 kg (189 lb) 86.456 kg (190 lb 9.6 oz)    History of present illness:  Please see admit h and p from 11/13 for details. Briefly, pt presents with dizziness following prolonged nose bleeding, found to be anemic and hypotensive in the ED. The patient was admitted to the floor for further management.  Hospital Course:  1. Acute blood loss anemia 1. Likely secondary to epistaxis 2. No further episodes of nose bleeding reported during this admit 3. Hgb remained stable 4. Iron supplement added 2. Hypotension 1. Likely secondary to acute blood loss anemia 3. Chronic afib 1. Overall rate controlled 2. Noted to have brief episode of tachycardia on 11/14, seems to be wide complex, but difficult to tell given motion artifact on rhythm strip 3. Follow up EKG unremarkable and rate controlled 4. Serial trop neg 5. 2d echo done with findings of mod pulm htn 6. Initially increased metoprolol to 50mg  bid from home 25mg  dose, however pt became bradycardic overnight with HR in the 30's and mildly hypotensive in the AM 7. Resumed home dose metoprolol of 25mg  bid with normal bp and rate control. Will continue 4. COPD 1. No active wheezing on  exam 2. monitor 5. DVT prophylaxis 1. SCD's  Discharge Exam: Filed Vitals:   07/13/14 0152 07/13/14 0537 07/13/14 0816 07/13/14 1057  BP: 102/45 119/51  120/51  Pulse: 68 83  86  Temp: 98.3 F (36.8 C) 98 F (36.7 C)    TempSrc: Oral Oral    Resp: 18 18    Height:      Weight:  86.456 kg (190 lb 9.6 oz)    SpO2: 100% 98% 96% 95%    General: Awake, in nad Cardiovascular: regular, s1, s2 Respiratory: normal resp effort, no wheezing  Discharge Instructions     Medication List    TAKE these medications        acetaminophen 325 MG tablet  Commonly known as:  TYLENOL  Take 2 tablets (650 mg total) by mouth every 4 (four) hours as needed.     aspirin EC 81 MG tablet  Take 81 mg by mouth daily.     budesonide-formoterol 160-4.5 MCG/ACT inhaler  Commonly known as:  SYMBICORT  Inhale 2 puffs into the lungs 2 (two) times daily.     buPROPion 150 MG 12 hr tablet  Commonly known as:  WELLBUTRIN SR  150mg  x 3 days then increase to 150mg  twice a day x 12 weeks     DSS 100 MG Caps  Take 100 mg by mouth 2 (two) times daily.     ergocalciferol 50000 UNITS capsule  Commonly known as:  VITAMIN D2  Take 50,000 Units by mouth 2 (two) times a week.     ferrous  sulfate 325 (65 FE) MG tablet  Take 1 tablet (325 mg total) by mouth daily with breakfast.     fish oil-omega-3 fatty acids 1000 MG capsule  Take 2 g by mouth daily.     isosorbide mononitrate 15 mg Tb24 24 hr tablet  Commonly known as:  IMDUR  Take 0.5 tablets (15 mg total) by mouth daily.  Start taking on:  07/14/2014     metoprolol tartrate 25 MG tablet  Commonly known as:  LOPRESSOR  Take 1 tablet (25 mg total) by mouth 2 (two) times daily. <Appointment 07/17/2014>     nitroGLYCERIN 0.4 MG SL tablet  Commonly known as:  NITROSTAT  Place 0.4 mg under the tongue every 5 (five) minutes as needed for chest pain.     simvastatin 5 MG tablet  Commonly known as:  ZOCOR  Take 1 tablet (5 mg total) by mouth at  bedtime. MUST KEEP APPOINTMENT 07/17/14 WITH DR BERRY FOR FUTURE REFILLS     sodium chloride 0.65 % Soln nasal spray  Commonly known as:  OCEAN  Place 1 spray into the nose daily as needed (For nosebleeds.).       No Known Allergies Follow-up Information    Follow up with Maximino Greenland, MD. Schedule an appointment as soon as possible for a visit in 1 week.   Specialty:  Internal Medicine   Contact information:   [address] [place] [address] [phone]       Follow up with Lorretta Harp, MD.   Specialty:  Cardiology   Why:  Follow up as scheduled this week   Contact information:   [address] [place] [address] [phone]        The results of significant diagnostics from this hospitalization (including imaging, microbiology, ancillary and laboratory) are listed below for reference.    Significant Diagnostic Studies: No results found.  Microbiology: No results found for this or any previous visit (from the past 240 hour(s)).   Labs: Basic Metabolic Panel:  Recent Labs Lab 07/10/14 1355 07/11/14 0454 07/11/14 1226 07/12/14 0024 07/13/14 0346  NA 139 138  --  138 139  K 4.6 4.1  --  4.3 4.1  CL 106 106  --  104 104  CO2 22 19  --  25 22  GLUCOSE 98 85  --  102* 96  BUN 30* 27*  --  24* 17  CREATININE 0.95 0.83  --  1.01 0.86  CALCIUM 8.4 8.3*  --  8.8 8.7  MG  --   --  2.0  --   --    Liver Function Tests: No results for input(s): AST, ALT, ALKPHOS, BILITOT, PROT, ALBUMIN in the last 168 hours. No results for input(s): LIPASE, AMYLASE in the last 168 hours. No results for input(s): AMMONIA in the last 168 hours. CBC:  Recent Labs Lab 07/10/14 1355 07/11/14 0454 07/12/14 0024 07/13/14 0346  WBC 7.8 7.2 7.9 7.4  NEUTROABS 6.3  --   --   --   HGB 8.6* 8.9* 8.9* 9.0*  HCT 27.5* 27.7* 28.0* 28.2*  MCV 70.9* 71.4* 72.2* 72.1*  PLT 210 187 189 192   Cardiac Enzymes:  Recent Labs Lab 07/11/14 0024  07/11/14 1226 07/11/14 1825  TROPONINI <0.30 <0.30 <0.30   BNP: BNP (last 3 results) No results for input(s): PROBNP in the last 8760 hours. CBG: No results for input(s): GLUCAP in the last 168 hours.   Signed:  Keyshon Stein  K  [facility] 07/13/2014, 1:07 PM

## 2014-07-13 NOTE — Progress Notes (Signed)
Patient is sleeping peacefully. Maintained on telemetry and is A-Fib with a heart rate of 95 on the monitor. Will continue to monitor.  Esperanza Heir, RN

## 2014-07-13 NOTE — Care Management Note (Signed)
    Page 1 of 1   07/13/2014     4:44:44 PM CARE MANAGEMENT NOTE 07/13/2014  Patient:  [facility]   Account Number:  [id number]  Date Initiated:  07/13/2014  Documentation initiated by:  Abdulahi Schor  Subjective/Objective Assessment:   Pt adm on 07/10/14 with nosebleed, anemia requiring transfusion.  PTA, pt resided at home with spouse.     Action/Plan:   Pt denies any needs for home; he states he is discharging home today.   Anticipated DC Date:  07/13/2014   Anticipated DC Plan:  [place]  CM consult      Choice offered to / List presented to:             Status of service:  Completed, signed off Medicare Important Message given?  NA - LOS <3 / Initial given by admissions (If response is "NO", the following Medicare IM given date fields will be blank) Date Medicare IM given:   Medicare IM given by:   Date Additional Medicare IM given:   Additional Medicare IM given by:    Discharge Disposition:  HOME/SELF CARE  Per UR Regulation:  Reviewed for med. necessity/level of care/duration of stay  If discussed at [place] of Stay Meetings, dates discussed:    Comments:

## 2014-07-13 NOTE — Progress Notes (Signed)
Patient and wife given discharge instructions.  All questions answered.  Patient discharged via wheelchair with all belongings.

## 2014-07-14 LAB — TYPE AND SCREEN
ABO/RH(D): A POS
Antibody Screen: NEGATIVE
Unit division: 0
Unit division: 0

## 2014-07-17 ENCOUNTER — Encounter: Payer: Self-pay | Admitting: Cardiovascular Disease

## 2014-07-17 ENCOUNTER — Ambulatory Visit (INDEPENDENT_AMBULATORY_CARE_PROVIDER_SITE_OTHER): Payer: Medicare Other | Admitting: Cardiovascular Disease

## 2014-07-17 VITALS — BP 102/54 | HR 68 | Ht 70.0 in | Wt 194.6 lb

## 2014-07-17 DIAGNOSIS — I482 Chronic atrial fibrillation, unspecified: Secondary | ICD-10-CM

## 2014-07-17 DIAGNOSIS — R911 Solitary pulmonary nodule: Secondary | ICD-10-CM

## 2014-07-17 DIAGNOSIS — I2510 Atherosclerotic heart disease of native coronary artery without angina pectoris: Secondary | ICD-10-CM

## 2014-07-17 MED ORDER — METOPROLOL TARTRATE 25 MG PO TABS: 25.0000 mg | ORAL_TABLET | Freq: Two times a day (BID) | ORAL | Status: DC

## 2014-07-17 MED ORDER — ISOSORBIDE MONONITRATE ER 30 MG PO TB24: 15.0000 mg | ORAL_TABLET | Freq: Every day | ORAL | Status: DC

## 2014-07-17 NOTE — Assessment & Plan Note (Signed)
Chronic atrial fibrillation rate controlled not on Coumadin anticoagulation because of history of GI bleeding and nosebleeds.

## 2014-07-17 NOTE — Assessment & Plan Note (Signed)
Recent 2-D echo revealed normal LV systolic function and mild aortic insufficiency

## 2014-07-17 NOTE — Assessment & Plan Note (Signed)
History of noncritical CAD by cardiac catheterization which I performed 10/14/12. He had a 50-60% long tubular mid LAD lesion which was negative by FFR.

## 2014-07-17 NOTE — Progress Notes (Signed)
07/17/2014 Mitchell Wood   07/01/1935  [id number]  Primary Physician Maximino Greenland, MD Primary Cardiologist: Lorretta Harp MD [name]   HPI:  The patient is a pleasant 79 year old male who we saw October 08, 2012, as a referral from Dr. Chase Caller after it was noted on a CT scan that he had coronary calcifications. On interview, the patient did admit to some chest pain, especially when he was at the gym. We decided to proceed with a diagnostic catheterization as the patient had had a previous Myoview, August 2010, that was not entirely normal, showing mild ischemia in the mid-anterior and apical regions. The patient underwent diagnostic catheterization by Dr. [name], October 14, 2012. This revealed a calcified 50% to 60% narrowing in the mid LAD, but no other significant coronary disease. He had preserved LV function. He had a generous descending thoracic aorta. He may require a CT angiogram in the future to further quantify this. He saw [name] back in February and has been doing well since. He denies chest pain or shortness of breath. A CT and a gram performed in March showed a moderate sized  thoracic aortic aneurysm measuring 4.2 cm. The patient does continue to smoke.he was recently admitted with a nosebleed requiring transfusion. He has chronic atrial fibrillation and had been on Coumadin in the past which was discontinued because of GI bleeding. He denies chest pain or shortness of breath.   Current Outpatient Prescriptions  Medication Sig Dispense Refill  . acetaminophen (TYLENOL) 325 MG tablet Take 2 tablets (650 mg total) by mouth every 4 (four) hours as needed.    Marland Kitchen aspirin EC 81 MG tablet Take 81 mg by mouth daily.    . budesonide-formoterol (SYMBICORT) 160-4.5 MCG/ACT inhaler Inhale 2 puffs into the lungs 2 (two) times daily. 1 Inhaler 6  . buPROPion (WELLBUTRIN SR) 150 MG 12 hr tablet 150mg  x 3 days then increase to 150mg  twice a day x 12 weeks 65 tablet  1  . docusate sodium 100 MG CAPS Take 100 mg by mouth 2 (two) times daily. 10 capsule 0  . ergocalciferol (VITAMIN D2) 50000 UNITS capsule Take 50,000 Units by mouth 2 (two) times a week.    . ferrous sulfate 325 (65 FE) MG tablet Take 1 tablet (325 mg total) by mouth daily with breakfast. 30 tablet 0  . fish oil-omega-3 fatty acids 1000 MG capsule Take 2 g by mouth daily.    . isosorbide mononitrate (IMDUR) 15 mg TB24 24 hr tablet Take 0.5 tablets (15 mg total) by mouth daily. 30 tablet 0  . metoprolol tartrate (LOPRESSOR) 25 MG tablet Take 1 tablet (25 mg total) by mouth 2 (two) times daily. <Appointment 07/17/2014> 60 tablet 0  . nitroGLYCERIN (NITROSTAT) 0.4 MG SL tablet Place 0.4 mg under the tongue every 5 (five) minutes as needed for chest pain.    . simvastatin (ZOCOR) 5 MG tablet Take 1 tablet (5 mg total) by mouth at bedtime. MUST KEEP APPOINTMENT 07/17/14 WITH DR [name] FOR FUTURE REFILLS 30 tablet 0  . sodium chloride (OCEAN) 0.65 % SOLN nasal spray Place 1 spray into the nose daily as needed (For nosebleeds.).      No current facility-administered medications for this visit.    No Known Allergies  History   Social History  . Marital Status: Married    Spouse Name: N/A    Number of Children: N/A  . Years of Education: N/A   Occupational History  .  retired    Social History Main Topics  . Smoking status: Current Every Day Smoker -- 1.00 packs/day for 50 years    Types: Cigarettes  . Smokeless tobacco: Never Used     Comment: 30+ yr  . Alcohol Use: No  . Drug Use: Not on file  . Sexual Activity: Not on file   Other Topics Concern  . Not on file   Social History Narrative     Review of Systems: General: negative for chills, fever, night sweats or weight changes.  Cardiovascular: negative for chest pain, dyspnea on exertion, edema, orthopnea, palpitations, paroxysmal nocturnal dyspnea or shortness of breath Dermatological: negative for rash Respiratory: negative  for cough or wheezing Urologic: negative for hematuria Abdominal: negative for nausea, vomiting, diarrhea, bright red blood per rectum, melena, or hematemesis Neurologic: negative for visual changes, syncope, or dizziness All other systems reviewed and are otherwise negative except as noted above.    Blood pressure 102/54, pulse 68, height 5\' 10"  (1.778 m), weight 194 lb 9.6 oz (88.27 kg).  General appearance: alert and no distress Neck: no adenopathy, no carotid bruit, no JVD, supple, symmetrical, trachea midline and thyroid not enlarged, symmetric, no tenderness/mass/nodules Lungs: clear to auscultation bilaterally Heart: regular rate and rhythm, S1, S2 normal, no murmur, click, rub or gallop Extremities: extremities normal, atraumatic, no cyanosis or edema  EKG atrial fibrillation with a ventricular response of 81, right bundle branch block/left anterior fascicular block (bifascicular block) with nonspecific ST and T-wave changes. I personally reviewed his EKG  ASSESSMENT AND PLAN:   CAD (coronary artery disease) 50-60% non critical LAD 10/14/12 History of noncritical CAD by cardiac catheterization which I performed 10/14/12. He had a 50-60% long tubular mid LAD lesion which was negative by FFR.  Aortic insufficiency, moderate Recent 2-D echo revealed normal LV systolic function and mild aortic insufficiency  Chronic atrial fibrillation, not on Coumadin secondary to history of GI bleeding Chronic atrial fibrillation rate controlled not on Coumadin anticoagulation because of history of GI bleeding and nosebleeds.  Thoracic aortic aneurysm Moderate sized for a descending thoracic aortic aneurysm measuring 4.1 cm by CT angiography in March of this year  Hyperlipidemia History of hyperlipidemia on low-dose simvastatin. He does relate some symptoms of myalgias and I have instructed him to take a "statin holiday to assess causality.      Lorretta Harp MD FACP,FACC,FAHA,  [facility] 07/17/2014 4:22 PM

## 2014-07-17 NOTE — Assessment & Plan Note (Signed)
History of hyperlipidemia on low-dose simvastatin. He does relate some symptoms of myalgias and I have instructed him to take a "statin holiday to assess causality.

## 2014-07-17 NOTE — Assessment & Plan Note (Signed)
Moderate sized for a descending thoracic aortic aneurysm measuring 4.1 cm by CT angiography in March of this year

## 2014-07-17 NOTE — Patient Instructions (Addendum)
Please STOP taking your Simvastatin for 1 month (30 days). After the 30 days you can begin your medicine again.  Your physician wants you to follow-up in 1 year with Dr. [name]. You will receive a reminder letter in the mail 2 months in advance. If you do not receive a letter, please call our office to schedule the follow-up appointment.   Dr [name] has ordered a CT of your chest to follow up a nodule on your lung.

## 2014-07-22 ENCOUNTER — Ambulatory Visit (HOSPITAL_COMMUNITY)
Admission: RE | Admit: 2014-07-22 | Discharge: 2014-07-22 | Disposition: A | Discharge status: Home / Self Care | Payer: Medicare Other | Source: Ambulatory Visit | Attending: Cardiovascular Disease | Admitting: Cardiovascular Disease

## 2014-07-22 DIAGNOSIS — J449 Chronic obstructive pulmonary disease, unspecified: Secondary | ICD-10-CM | POA: Diagnosis not present

## 2014-07-22 DIAGNOSIS — F1010 Alcohol abuse, uncomplicated: Secondary | ICD-10-CM | POA: Diagnosis not present

## 2014-07-22 DIAGNOSIS — R911 Solitary pulmonary nodule: Secondary | ICD-10-CM | POA: Diagnosis present

## 2014-07-22 DIAGNOSIS — C61 Malignant neoplasm of prostate: Secondary | ICD-10-CM | POA: Diagnosis not present

## 2014-08-06 ENCOUNTER — Encounter (HOSPITAL_COMMUNITY): Payer: Self-pay | Admitting: Cardiovascular Disease

## 2014-10-09 ENCOUNTER — Telehealth: Payer: Self-pay | Admitting: *Deleted

## 2014-10-09 NOTE — Telephone Encounter (Signed)
-----   Message from Lorretta Harp, MD sent at 10/08/2014  4:42 PM EST ----- We can to the CTA of his aneurysm November 2016 ----- Message -----    From: Chauncy Lean, RN    Sent: 10/07/2014   1:03 PM      To: Lorretta Harp, MD  Mitchell Wood has a TAA.  You ordered a CT in March 2015 to evaluate. Those results showed 4.1 TAA and a pulmonary nodule.  Primary was going to follow the pulmonary nodule and we would do another CT in 1 year to follow up the TAA.    Then at his November 2015 office visit with you, you ordered a CT to follow up on his lung nodule.  He had that done 07/22/14 and it showed a stable nodule (with recommended follow up in 6 months) and stable aneurysm ectasia.   You resulted it by saying follow up with PCP for pulmonary nodule.   Now, he is due for the yearly CT that looks at his TAA.  Does he need it since the scan in November commented on it?  I know the scan from November didn't go into much detail about the TAA, but I just want to make sure that Mitchell Wood doesn't go through any unnecessary testing.   Please take a look and let me know.  Thanks! Mitchell Wood

## 2014-10-09 NOTE — Telephone Encounter (Signed)
Translation from Dr Kennon Holter message---patient doesn't need a CT in March 2016, he will be due for a CT in November 2016.    I called and spoke with patient's wife, Mitchell Wood, she is agreeable with a CT in November.   Message sent to scheduler. Order expected date changed

## 2014-10-14 DIAGNOSIS — E784 Other hyperlipidemia: Secondary | ICD-10-CM | POA: Diagnosis not present

## 2014-10-14 DIAGNOSIS — D500 Iron deficiency anemia secondary to blood loss (chronic): Secondary | ICD-10-CM | POA: Diagnosis not present

## 2014-10-14 DIAGNOSIS — R040 Epistaxis: Secondary | ICD-10-CM | POA: Diagnosis not present

## 2014-10-14 DIAGNOSIS — Z79899 Other long term (current) drug therapy: Secondary | ICD-10-CM | POA: Diagnosis not present

## 2014-11-12 ENCOUNTER — Other Ambulatory Visit: Payer: Self-pay

## 2014-12-14 ENCOUNTER — Encounter (HOSPITAL_COMMUNITY): Payer: Self-pay | Admitting: Emergency Medicine

## 2014-12-14 ENCOUNTER — Emergency Department (HOSPITAL_COMMUNITY)
Admission: EM | Admit: 2014-12-14 | Discharge: 2014-12-14 | Disposition: A | Discharge status: Home / Self Care | Payer: Medicare Other | Attending: Emergency Medicine | Admitting: Emergency Medicine

## 2014-12-14 DIAGNOSIS — I2510 Atherosclerotic heart disease of native coronary artery without angina pectoris: Secondary | ICD-10-CM | POA: Insufficient documentation

## 2014-12-14 DIAGNOSIS — Z7951 Long term (current) use of inhaled steroids: Secondary | ICD-10-CM | POA: Insufficient documentation

## 2014-12-14 DIAGNOSIS — Z79899 Other long term (current) drug therapy: Secondary | ICD-10-CM | POA: Insufficient documentation

## 2014-12-14 DIAGNOSIS — E559 Vitamin D deficiency, unspecified: Secondary | ICD-10-CM | POA: Diagnosis not present

## 2014-12-14 DIAGNOSIS — Z8719 Personal history of other diseases of the digestive system: Secondary | ICD-10-CM | POA: Insufficient documentation

## 2014-12-14 DIAGNOSIS — J449 Chronic obstructive pulmonary disease, unspecified: Secondary | ICD-10-CM | POA: Insufficient documentation

## 2014-12-14 DIAGNOSIS — R000 Tachycardia, unspecified: Secondary | ICD-10-CM | POA: Diagnosis not present

## 2014-12-14 DIAGNOSIS — Z8546 Personal history of malignant neoplasm of prostate: Secondary | ICD-10-CM | POA: Insufficient documentation

## 2014-12-14 DIAGNOSIS — Z9889 Other specified postprocedural states: Secondary | ICD-10-CM | POA: Diagnosis not present

## 2014-12-14 DIAGNOSIS — Z87448 Personal history of other diseases of urinary system: Secondary | ICD-10-CM | POA: Diagnosis not present

## 2014-12-14 DIAGNOSIS — H409 Unspecified glaucoma: Secondary | ICD-10-CM | POA: Diagnosis not present

## 2014-12-14 DIAGNOSIS — F419 Anxiety disorder, unspecified: Secondary | ICD-10-CM | POA: Diagnosis not present

## 2014-12-14 DIAGNOSIS — Z720 Tobacco use: Secondary | ICD-10-CM | POA: Insufficient documentation

## 2014-12-14 DIAGNOSIS — R040 Epistaxis: Secondary | ICD-10-CM

## 2014-12-14 DIAGNOSIS — Z7982 Long term (current) use of aspirin: Secondary | ICD-10-CM | POA: Diagnosis not present

## 2014-12-14 LAB — CBC WITH DIFFERENTIAL/PLATELET
Basophils Absolute: 0 10*3/uL (ref 0.0–0.1)
Basophils Relative: 0 % (ref 0–1)
Eosinophils Absolute: 0.1 10*3/uL (ref 0.0–0.7)
Eosinophils Relative: 1 % (ref 0–5)
HCT: 28 % — ABNORMAL LOW (ref 39.0–52.0)
Hemoglobin: 8.9 g/dL — ABNORMAL LOW (ref 13.0–17.0)
Lymphocytes Relative: 15 % (ref 12–46)
Lymphs Abs: 1.4 10*3/uL (ref 0.7–4.0)
MCH: 24.3 pg — ABNORMAL LOW (ref 26.0–34.0)
MCHC: 31.8 g/dL (ref 30.0–36.0)
MCV: 76.5 fL — AB (ref 78.0–100.0)
MONOS PCT: 6 % (ref 3–12)
Monocytes Absolute: 0.5 10*3/uL (ref 0.1–1.0)
NEUTROS PCT: 78 % — AB (ref 43–77)
Neutro Abs: 7.1 10*3/uL (ref 1.7–7.7)
PLATELETS: 278 10*3/uL (ref 150–400)
RBC: 3.66 MIL/uL — ABNORMAL LOW (ref 4.22–5.81)
RDW: 24.9 % — ABNORMAL HIGH (ref 11.5–15.5)
WBC: 9.1 10*3/uL (ref 4.0–10.5)

## 2014-12-14 LAB — COMPREHENSIVE METABOLIC PANEL
ALK PHOS: 52 U/L (ref 39–117)
ALT: 14 U/L (ref 0–53)
ANION GAP: 9 (ref 5–15)
AST: 24 U/L (ref 0–37)
Albumin: 3.2 g/dL — ABNORMAL LOW (ref 3.5–5.2)
BUN: 16 mg/dL (ref 6–23)
CO2: 26 mmol/L (ref 19–32)
Calcium: 9 mg/dL (ref 8.4–10.5)
Chloride: 104 mmol/L (ref 96–112)
Creatinine, Ser: 0.99 mg/dL (ref 0.50–1.35)
GFR calc non Af Amer: 76 mL/min — ABNORMAL LOW (ref >90)
GFR, EST AFRICAN AMERICAN: 89 mL/min — AB (ref >90)
Glucose, Bld: 106 mg/dL — ABNORMAL HIGH (ref 70–99)
POTASSIUM: 4.2 mmol/L (ref 3.5–5.1)
SODIUM: 139 mmol/L (ref 135–145)
Total Bilirubin: 0.6 mg/dL (ref 0.3–1.2)
Total Protein: 6.8 g/dL (ref 6.0–8.3)

## 2014-12-14 LAB — PROTIME-INR
INR: 1.16 (ref 0.00–1.49)
Prothrombin Time: 14.9 seconds (ref 11.6–15.2)

## 2014-12-14 NOTE — ED Provider Notes (Signed)
CSN: [id number]     Arrival date & time 12/14/14  1129 History   First MD Initiated Contact with Patient 12/14/14 1323     Chief Complaint  Patient presents with  . Epistaxis     (Consider location/radiation/quality/duration/timing/severity/associated sxs/prior Treatment) HPI Comments: 80 year old male with history of coronary artery disease, on baby aspirin, chronic atrial fibrillation not on Coumadin due to GI bleeding, recurrent epistaxis presents with intermittent nosebleed since earlier today, has improved with time. Patient did not try any significant treatment. Patient denies any syncope or lightheadedness. No chest pain or other symptoms. Bleeding controlled this time.  Patient is a 80 y.o. male presenting with nosebleeds. The history is provided by the patient.  Epistaxis Associated symptoms: no fever     Past Medical History  Diagnosis Date  . Anxiety   . ED (erectile dysfunction)   . ETOH abuse     liver disease  . Hematospermia   . Prostate cancer     chemo/radiation 2000  . Ventral hernia   . COPD (chronic obstructive pulmonary disease)     smoker  . Vitamin D deficiency   . Tobacco abuse   . Coronary artery disease 10/14/12    50-60% LAD  . Moderate aortic insufficiency 10/11/11    NL LVF  . Glaucoma   . PVD (peripheral vascular disease) 10/14/12    dilated Ao root at cath  . Atrial fibrillation     NUC, 03/31/2009 - No ECG changes, EKG negative for ischemia, clinical correlation recommeneded, mild anteroapical ischemia  . Chronic atrial fibrillation     not on Coumadin secondary to GI bleed  . RBBB   . Aortic valve disorder     2D ECHO, 11/18/2012 - EF 50-55%, normal   Past Surgical History  Procedure Laterality Date  . Ventral hernia repair    . Cardiac catheterization  10/14/2012    Tubular moderate lesion in the mid LAD, proceed with FFR interrogation to determine physiologic significance  . Left heart catheterization with coronary angiogram N/A 10/14/2012     Procedure: LEFT HEART CATHETERIZATION WITH CORONARY ANGIOGRAM;  Surgeon: Lorretta Harp, MD;  Location: [facility] CATH LAB;  Service: Cardiovascular;  Laterality: N/A;   Family History  Problem Relation Age of Onset  . Emphysema Brother   . Cancer Brother   . Allergies      whole family  . Diabetes Sister   . Cancer Sister   . Diabetes Child   . Diabetes Child   . Diabetes Child    History  Substance Use Topics  . Smoking status: Current Every Day Smoker -- 1.00 packs/day for 50 years    Types: Cigarettes  . Smokeless tobacco: Never Used     Comment: 30+ yr  . Alcohol Use: No    Review of Systems  Constitutional: Negative for fever and chills.  HENT: Positive for nosebleeds.   Neurological: Negative for syncope and light-headedness.      Allergies  Review of patient's allergies indicates no known allergies.  Home Medications   Prior to Admission medications   Medication Sig Start Date End Date Taking? Authorizing Provider  acetaminophen (TYLENOL) 325 MG tablet Take 2 tablets (650 mg total) by mouth every 4 (four) hours as needed. Patient taking differently: Take 650 mg by mouth every 4 (four) hours as needed for mild pain or moderate pain.  10/15/12  Yes Erlene Quan, PA-C  aspirin EC 81 MG tablet Take 81 mg by mouth daily.  Yes Historical Provider, MD  budesonide-formoterol (SYMBICORT) 160-4.5 MCG/ACT inhaler Inhale 2 puffs into the lungs 2 (two) times daily. 09/23/12  Yes Brand Males, MD  ergocalciferol (VITAMIN D2) 50000 UNITS capsule Take 50,000 Units by mouth 2 (two) times a week.   Yes Historical Provider, MD  ferrous sulfate 325 (65 FE) MG tablet Take 1 tablet (325 mg total) by mouth daily with breakfast. 07/13/14  Yes Donne Hazel, MD  isosorbide mononitrate (IMDUR) 30 MG 24 hr tablet Take 0.5 tablets (15 mg total) by mouth daily. 07/17/14  Yes Lorretta Harp, MD  latanoprost (XALATAN) 0.005 % ophthalmic solution Place 1 drop into both eyes at bedtime.  09/27/14  Yes Historical Provider, MD  metoprolol tartrate (LOPRESSOR) 25 MG tablet Take 1 tablet (25 mg total) by mouth 2 (two) times daily. 07/17/14  Yes Lorretta Harp, MD  nitroGLYCERIN (NITROSTAT) 0.4 MG SL tablet Place 0.4 mg under the tongue every 5 (five) minutes as needed for chest pain.   Yes Historical Provider, MD  sodium chloride (OCEAN) 0.65 % SOLN nasal spray Place 1 spray into the nose daily as needed (For nosebleeds.).    Yes Historical Provider, MD  buPROPion (WELLBUTRIN SR) 150 MG 12 hr tablet 150mg  x 3 days then increase to 150mg  twice a day x 12 weeks Patient not taking: Reported on 12/14/2014 02/25/13   Brand Males, MD  docusate sodium 100 MG CAPS Take 100 mg by mouth 2 (two) times daily. Patient not taking: Reported on 12/14/2014 07/13/14   Donne Hazel, MD  simvastatin (ZOCOR) 5 MG tablet Take 1 tablet (5 mg total) by mouth at bedtime. MUST KEEP APPOINTMENT 07/17/14 WITH DR [name] FOR FUTURE REFILLS Patient not taking: Reported on 12/14/2014 06/23/14   Lorretta Harp, MD   BP 106/54 mmHg  Pulse 110  Temp(Src) 97.4 F (36.3 C) (Oral)  Resp 18  SpO2 98% Physical Exam  Constitutional: He is oriented to person, place, and time. He appears well-developed and well-nourished. No distress.  HENT:  Head: Normocephalic.  Patient has focal area of minimal bleeding right anterior septum, overall bleeding controlled  Eyes: Right eye exhibits no discharge. Left eye exhibits no discharge.  Neck: Neck supple.  Cardiovascular: Tachycardia present.   Pulmonary/Chest: Effort normal.  Neurological: He is alert and oriented to person, place, and time.  Skin: Skin is warm.  Psychiatric: He has a normal mood and affect.  Nursing note and vitals reviewed.   ED Course  Procedures (including critical care time) Epistaxis Silver nitrate used to cauterize small anterior septal bleeding right near. Patient tolerated well performed by myself.  Labs Review Labs Reviewed  CBC WITH  DIFFERENTIAL/PLATELET - Abnormal; Notable for the following:    RBC 3.66 (*)    Hemoglobin 8.9 (*)    HCT 28.0 (*)    MCV 76.5 (*)    MCH 24.3 (*)    RDW 24.9 (*)    Neutrophils Relative % 78 (*)    All other components within normal limits  COMPREHENSIVE METABOLIC PANEL - Abnormal; Notable for the following:    Glucose, Bld 106 (*)    Albumin 3.2 (*)    GFR calc non Af Amer 76 (*)    GFR calc Af Amer 89 (*)    All other components within normal limits  PROTIME-INR    Imaging Review No results found.   EKG Interpretation None      MDM   Final diagnoses:  Anterior epistaxis   Patient  presents with intermittent epistaxis, no significant lacunar except for baby aspirin. Patient well-appearing otherwise with no other symptoms. Bleeding controlled with silver nitrate stick. Observed in the ER. Close outpatient follow up discussed.  Anemia baseline.  Results and differential diagnosis were discussed with the patient/parent/guardian. Close follow up outpatient was discussed, comfortable with the plan.   Medications - No data to display  Filed Vitals:   12/14/14 1151 12/14/14 1400  BP: 108/38 106/54  Pulse: 110   Temp: 97.4 F (36.3 C)   TempSrc: Oral   Resp: 18   SpO2: 98%     Final diagnoses:  Anterior epistaxis       Elnora Morrison, MD 12/14/14 1419

## 2014-12-14 NOTE — Discharge Instructions (Signed)
If bleeding resumes hold pressure for 20 minutes at a time, seek medical attention if unable to control despite multiple attempts of holding pressure or if you develop lightheadedness or syncope/passing out.   If you were given medicines take as directed.  If you are on coumadin or contraceptives realize their levels and effectiveness is altered by many different medicines.  If you have any reaction (rash, tongues swelling, other) to the medicines stop taking and see a physician.   Please follow up as directed and return to the ER or see a physician for new or worsening symptoms.  Thank you. Filed Vitals:   12/14/14 1151 12/14/14 1400  BP: 108/38 106/54  Pulse: 110   Temp: 97.4 F (36.3 C)   TempSrc: Oral   Resp: 18   SpO2: 98%

## 2014-12-14 NOTE — ED Notes (Signed)
Pt's step dtr Verl Bangs [phone].

## 2014-12-14 NOTE — ED Notes (Signed)
Pt here for nosebleed starting today; pt sent here for eval; stopped at present

## 2015-02-01 ENCOUNTER — Other Ambulatory Visit: Payer: Self-pay | Admitting: Family

## 2015-02-01 DIAGNOSIS — R609 Edema, unspecified: Secondary | ICD-10-CM

## 2015-02-03 ENCOUNTER — Ambulatory Visit
Admission: RE | Admit: 2015-02-03 | Discharge: 2015-02-03 | Disposition: A | Discharge status: Home / Self Care | Payer: Medicare Other | Source: Ambulatory Visit | Attending: Family | Admitting: Family

## 2015-02-03 DIAGNOSIS — R609 Edema, unspecified: Secondary | ICD-10-CM

## 2015-03-11 ENCOUNTER — Encounter: Payer: Self-pay | Admitting: Emergency Medicine

## 2015-05-19 ENCOUNTER — Emergency Department (HOSPITAL_COMMUNITY): Payer: Medicare Other

## 2015-05-19 ENCOUNTER — Emergency Department (HOSPITAL_COMMUNITY)
Admission: EM | Admit: 2015-05-19 | Discharge: 2015-05-19 | Disposition: A | Discharge status: Home / Self Care | Payer: Medicare Other | Attending: Emergency Medicine | Admitting: Emergency Medicine

## 2015-05-19 ENCOUNTER — Encounter (HOSPITAL_COMMUNITY): Payer: Self-pay | Admitting: *Deleted

## 2015-05-19 DIAGNOSIS — I2510 Atherosclerotic heart disease of native coronary artery without angina pectoris: Secondary | ICD-10-CM | POA: Insufficient documentation

## 2015-05-19 DIAGNOSIS — Z7982 Long term (current) use of aspirin: Secondary | ICD-10-CM | POA: Insufficient documentation

## 2015-05-19 DIAGNOSIS — Z720 Tobacco use: Secondary | ICD-10-CM | POA: Insufficient documentation

## 2015-05-19 DIAGNOSIS — I482 Chronic atrial fibrillation: Secondary | ICD-10-CM | POA: Diagnosis not present

## 2015-05-19 DIAGNOSIS — Z8546 Personal history of malignant neoplasm of prostate: Secondary | ICD-10-CM | POA: Insufficient documentation

## 2015-05-19 DIAGNOSIS — Z79899 Other long term (current) drug therapy: Secondary | ICD-10-CM | POA: Diagnosis not present

## 2015-05-19 DIAGNOSIS — I959 Hypotension, unspecified: Secondary | ICD-10-CM | POA: Insufficient documentation

## 2015-05-19 DIAGNOSIS — R4781 Slurred speech: Secondary | ICD-10-CM | POA: Insufficient documentation

## 2015-05-19 DIAGNOSIS — E559 Vitamin D deficiency, unspecified: Secondary | ICD-10-CM | POA: Diagnosis not present

## 2015-05-19 DIAGNOSIS — Z7951 Long term (current) use of inhaled steroids: Secondary | ICD-10-CM | POA: Insufficient documentation

## 2015-05-19 DIAGNOSIS — H409 Unspecified glaucoma: Secondary | ICD-10-CM | POA: Insufficient documentation

## 2015-05-19 DIAGNOSIS — Z9889 Other specified postprocedural states: Secondary | ICD-10-CM | POA: Diagnosis not present

## 2015-05-19 DIAGNOSIS — F419 Anxiety disorder, unspecified: Secondary | ICD-10-CM | POA: Diagnosis not present

## 2015-05-19 DIAGNOSIS — R531 Weakness: Secondary | ICD-10-CM | POA: Diagnosis not present

## 2015-05-19 DIAGNOSIS — J449 Chronic obstructive pulmonary disease, unspecified: Secondary | ICD-10-CM | POA: Insufficient documentation

## 2015-05-19 LAB — I-STAT TROPONIN, ED: Troponin i, poc: 0.01 ng/mL (ref 0.00–0.08)

## 2015-05-19 LAB — URINALYSIS, ROUTINE W REFLEX MICROSCOPIC
BILIRUBIN URINE: NEGATIVE
GLUCOSE, UA: NEGATIVE mg/dL
Hgb urine dipstick: NEGATIVE
Ketones, ur: NEGATIVE mg/dL
Leukocytes, UA: NEGATIVE
Nitrite: NEGATIVE
PH: 5 (ref 5.0–8.0)
Protein, ur: NEGATIVE mg/dL
Specific Gravity, Urine: 1.02 (ref 1.005–1.030)
Urobilinogen, UA: 1 mg/dL (ref 0.0–1.0)

## 2015-05-19 LAB — BRAIN NATRIURETIC PEPTIDE: B NATRIURETIC PEPTIDE 5: 208 pg/mL — AB (ref 0.0–100.0)

## 2015-05-19 LAB — CBC
HCT: 39.5 % (ref 39.0–52.0)
Hemoglobin: 13.2 g/dL (ref 13.0–17.0)
MCH: 27.2 pg (ref 26.0–34.0)
MCHC: 33.4 g/dL (ref 30.0–36.0)
MCV: 81.4 fL (ref 78.0–100.0)
PLATELETS: 194 10*3/uL (ref 150–400)
RBC: 4.85 MIL/uL (ref 4.22–5.81)
RDW: 19.9 % — ABNORMAL HIGH (ref 11.5–15.5)
WBC: 6.9 10*3/uL (ref 4.0–10.5)

## 2015-05-19 LAB — BASIC METABOLIC PANEL
Anion gap: 7 (ref 5–15)
BUN: 16 mg/dL (ref 6–20)
CO2: 30 mmol/L (ref 22–32)
CREATININE: 1.02 mg/dL (ref 0.61–1.24)
Calcium: 9.3 mg/dL (ref 8.9–10.3)
Chloride: 102 mmol/L (ref 101–111)
GFR calc Af Amer: >60 mL/min (ref >60)
GFR calc non Af Amer: >60 mL/min (ref >60)
GLUCOSE: 94 mg/dL (ref 65–99)
Potassium: 3.7 mmol/L (ref 3.5–5.1)
Sodium: 139 mmol/L (ref 135–145)

## 2015-05-19 NOTE — ED Notes (Signed)
Pts family reports stroke like symptoms with weakness, slurred speech and drooling for over 1 month. Pt was seen by his MD for same today. Was sent her for eval and hypotension at 423'T systolic. Pt alert and oriented x3 in triage.

## 2015-05-19 NOTE — Discharge Instructions (Signed)

## 2015-05-19 NOTE — ED Provider Notes (Signed)
CSN: [id number]     Arrival date & time 05/19/15  1247 History   First MD Initiated Contact with Patient 05/19/15 1507     Chief Complaint  Patient presents with  . Hypotension  . Weakness     (Consider location/radiation/quality/duration/timing/severity/associated sxs/prior Treatment) Patient is a 80 y.o. male presenting with general illness.  Illness Location:  Generalized Quality:  Weakness, drooling Severity:  Moderate Onset quality:  Gradual Duration:  1 month Timing:  Constant Progression:  Worsening Chronicity:  New Context:  Seen by PCP and sent for hypotension (bp >973 systolic) Relieved by:  Nothing Worsened by:  Nothing Associated symptoms: fatigue   Associated symptoms: no chest pain, no cough, no loss of consciousness, no nausea, no rash and no vomiting     Past Medical History  Diagnosis Date  . Anxiety   . ED (erectile dysfunction)   . ETOH abuse     liver disease  . Hematospermia   . Prostate cancer     chemo/radiation 2000  . Ventral hernia   . COPD (chronic obstructive pulmonary disease)     smoker  . Vitamin D deficiency   . Tobacco abuse   . Coronary artery disease 10/14/12    50-60% LAD  . Moderate aortic insufficiency 10/11/11    NL LVF  . Glaucoma   . PVD (peripheral vascular disease) 10/14/12    dilated Ao root at cath  . Atrial fibrillation     NUC, 03/31/2009 - No ECG changes, EKG negative for ischemia, clinical correlation recommeneded, mild anteroapical ischemia  . Chronic atrial fibrillation     not on Coumadin secondary to GI bleed  . RBBB   . Aortic valve disorder     2D ECHO, 11/18/2012 - EF 50-55%, normal   Past Surgical History  Procedure Laterality Date  . Ventral hernia repair    . Cardiac catheterization  10/14/2012    Tubular moderate lesion in the mid LAD, proceed with FFR interrogation to determine physiologic significance  . Left heart catheterization with coronary angiogram N/A 10/14/2012    Procedure: LEFT HEART  CATHETERIZATION WITH CORONARY ANGIOGRAM;  Surgeon: Lorretta Harp, MD;  Location: [facility] CATH LAB;  Service: Cardiovascular;  Laterality: N/A;   Family History  Problem Relation Age of Onset  . Emphysema Brother   . Cancer Brother   . Allergies      whole family  . Diabetes Sister   . Cancer Sister   . Diabetes Child   . Diabetes Child   . Diabetes Child    Social History  Substance Use Topics  . Smoking status: Current Every Day Smoker -- 1.00 packs/day for 50 years    Types: Cigarettes  . Smokeless tobacco: Never Used     Comment: 30+ yr  . Alcohol Use: No    Review of Systems  Constitutional: Positive for fatigue.  Respiratory: Negative for cough.   Cardiovascular: Negative for chest pain.  Gastrointestinal: Negative for nausea and vomiting.  Skin: Negative for rash.  Neurological: Negative for loss of consciousness.  All other systems reviewed and are negative.     Allergies  Review of patient's allergies indicates no known allergies.  Home Medications   Prior to Admission medications   Medication Sig Start Date End Date Taking? Authorizing Provider  budesonide-formoterol (SYMBICORT) 160-4.5 MCG/ACT inhaler Inhale 2 puffs into the lungs 2 (two) times daily. 09/23/12  Yes Brand Males, MD  docusate sodium 100 MG CAPS Take 100 mg by mouth 2 (two)  times daily. Patient taking differently: Take 100 mg by mouth daily.  07/13/14  Yes Donne Hazel, MD  metoprolol tartrate (LOPRESSOR) 25 MG tablet Take 1 tablet (25 mg total) by mouth 2 (two) times daily. 07/17/14  Yes Lorretta Harp, MD  nitroGLYCERIN (NITROSTAT) 0.4 MG SL tablet Place 0.4 mg under the tongue every 5 (five) minutes as needed for chest pain.   Yes Historical Provider, MD  acetaminophen (TYLENOL) 325 MG tablet Take 2 tablets (650 mg total) by mouth every 4 (four) hours as needed. Patient taking differently: Take 650 mg by mouth every 4 (four) hours as needed for mild pain or moderate pain.  10/15/12    Erlene Quan, PA-C  aspirin EC 81 MG tablet Take 81 mg by mouth daily.    Historical Provider, MD  buPROPion (WELLBUTRIN SR) 150 MG 12 hr tablet '150mg'$  x 3 days then increase to '150mg'$  twice a day x 12 weeks Patient not taking: Reported on 12/14/2014 02/25/13   Brand Males, MD  ergocalciferol (VITAMIN D2) 50000 UNITS capsule Take 50,000 Units by mouth 2 (two) times a week.    Historical Provider, MD  ferrous sulfate 325 (65 FE) MG tablet Take 1 tablet (325 mg total) by mouth daily with breakfast. 07/13/14   Donne Hazel, MD  isosorbide mononitrate (IMDUR) 30 MG 24 hr tablet Take 0.5 tablets (15 mg total) by mouth daily. 07/17/14   Lorretta Harp, MD  latanoprost (XALATAN) 0.005 % ophthalmic solution Place 1 drop into both eyes at bedtime. 09/27/14   Historical Provider, MD  simvastatin (ZOCOR) 5 MG tablet Take 1 tablet (5 mg total) by mouth at bedtime. MUST KEEP APPOINTMENT 07/17/14 WITH DR [name] FOR FUTURE REFILLS Patient not taking: Reported on 12/14/2014 06/23/14   Lorretta Harp, MD  sodium chloride (OCEAN) 0.65 % SOLN nasal spray Place 1 spray into the nose daily as needed (For nosebleeds.).     Historical Provider, MD   BP 117/58 mmHg  Pulse 79  Temp(Src) 98.1 F (36.7 C) (Oral)  Resp 20  Ht '5\' 10"'$  (1.778 m)  Wt 182 lb (82.555 kg)  BMI 26.11 kg/m2  SpO2 97% Physical Exam  Constitutional: He is oriented to person, place, and time. He appears well-developed and well-nourished.  HENT:  Head: Normocephalic and atraumatic.  Eyes: Conjunctivae and EOM are normal.  Neck: Normal range of motion. Neck supple.  Cardiovascular: Normal rate, regular rhythm and normal heart sounds.   Pulmonary/Chest: Effort normal and breath sounds normal. No respiratory distress.  Abdominal: He exhibits no distension. There is no tenderness. There is no rebound and no guarding.  Musculoskeletal: Normal range of motion.  Neurological: He is alert and oriented to person, place, and time. He has normal  strength and normal reflexes. No cranial nerve deficit or sensory deficit. Coordination normal.  Skin: Skin is warm and dry.  Vitals reviewed.   ED Course  Procedures (including critical care time) Labs Review Labs Reviewed  CBC - Abnormal; Notable for the following:    RDW 19.9 (*)    All other components within normal limits  URINALYSIS, ROUTINE W REFLEX MICROSCOPIC (NOT AT [facility]) - Abnormal; Notable for the following:    APPearance HAZY (*)    All other components within normal limits  BRAIN NATRIURETIC PEPTIDE - Abnormal; Notable for the following:    B Natriuretic Peptide 208.0 (*)    All other components within normal limits  BASIC METABOLIC PANEL  CBG MONITORING, ED  I-STAT  TROPOININ, ED    Imaging Review Ct Head Wo Contrast  05/19/2015   CLINICAL DATA:  Slurred speech, weakness and drooling 1 month.  EXAM: CT HEAD WITHOUT CONTRAST  TECHNIQUE: Contiguous axial images were obtained from the base of the skull through the vertex without intravenous contrast.  COMPARISON:  None.  FINDINGS: Ventricles are within normal. There is mild prominence of the cisterna magna. There is minimal age related atrophic change. There is mild chronic ischemic microvascular disease present. There is no mass, mass effect, shift of midline structures or acute hemorrhage. No evidence to suggest acute infarction. Remaining bony structures and soft tissues are within normal.  IMPRESSION: No acute intracranial findings.  Mild chronic ischemic microvascular disease and age related atrophic change.   Electronically Signed   By: Marin Olp M.D.   On: 05/19/2015 17:20   I have personally reviewed and evaluated these images and lab results as part of my medical decision-making.   EKG Interpretation   Date/Time:  Wednesday May 19 2015 13:30:26 EDT Ventricular Rate:  77 PR Interval:  152 QRS Duration: 154 QT Interval:  428 QTC Calculation: 484 R Axis:   -65 Text Interpretation:    Sinus rhythm with  marked sinus arrhythmia with  Premature atrial complexes Right bundle branch block Left anterior  fascicular block Abnormal ECG No significant change since last tracing  Confirmed by Debby Freiberg [phone]) on 05/19/2015 3:09:56 PM      MDM   Final diagnoses:  Generalized weakness     80 y.o. male with pertinent PMH of afib, copd, cad presents with generalized weakness x 1 month.  No fevers, acute change, lateralizing weakness.  Pt has had progressive decline in functional status for over 6 months according to pt.  On arrival vitals and physical exam as above.  Pt has a slow but not ataxic gait, no focal neuro deficits.  Wu unremarkable.  Discussed possibility of remote occult CVA vs other emergent pathology and family state they will help pt while his wife is out of town.  BP normal throughout stay today.  DC home in stable condition.  I have reviewed all laboratory and imaging studies if ordered as above  1. Generalized weakness         Debby Freiberg, MD 05/19/15 2000

## 2015-05-19 NOTE — ED Notes (Signed)
Pt left with all belongings and was wheeled out of treatment area with family.

## 2015-06-10 ENCOUNTER — Ambulatory Visit (INDEPENDENT_AMBULATORY_CARE_PROVIDER_SITE_OTHER): Payer: Medicare Other | Admitting: Neurology

## 2015-06-10 ENCOUNTER — Encounter: Payer: Self-pay | Admitting: Neurology

## 2015-06-10 VITALS — BP 111/71 | HR 85 | Ht 69.0 in | Wt 179.0 lb

## 2015-06-10 DIAGNOSIS — G20 Parkinson's disease: Secondary | ICD-10-CM

## 2015-06-10 DIAGNOSIS — E538 Deficiency of other specified B group vitamins: Secondary | ICD-10-CM

## 2015-06-10 DIAGNOSIS — G20C Parkinsonism, unspecified: Secondary | ICD-10-CM

## 2015-06-10 DIAGNOSIS — R413 Other amnesia: Secondary | ICD-10-CM

## 2015-06-10 DIAGNOSIS — R269 Unspecified abnormalities of gait and mobility: Secondary | ICD-10-CM

## 2015-06-10 HISTORY — DX: Unspecified abnormalities of gait and mobility: R26.9

## 2015-06-10 HISTORY — DX: Parkinsonism, unspecified: G20.C

## 2015-06-10 HISTORY — DX: Parkinson's disease: G20

## 2015-06-10 HISTORY — DX: Other amnesia: R41.3

## 2015-06-10 MED ORDER — CARBIDOPA-LEVODOPA 25-100 MG PO TABS: 0.5000 | ORAL_TABLET | Freq: Three times a day (TID) | ORAL | Status: DC

## 2015-06-10 NOTE — Progress Notes (Signed)
Reason for visit: Gait disorder  Referring physician: Dr. Baird Cancer  Mitchell Wood is a 80 y.o. male  History of present illness:  [name] is a 80 year old right-handed white male with a history of some gradual change in walking and memory that began in the spring of 2016. In June 2016, the patient had a headache for a day, the wife believes that his speech worsened around that time, and he began drooling more. The patient has had some worsening of incontinence of the bladder. The patient has had a change in his ability to walk, he is shuffling when he first starts walking, and with turns. He has not had any falls, he does not use a cane or a walker for ambulation. He does drool quite a bit, and he has a sensation of generalized weakness. His rapidity of movement has decreased, activities of daily living are taking longer and longer to perform. The memory has continued to decline over the last 4-5 months. At times, the wife indicates that he has some tremors of the arms, but this generally is not present. The patient has gone to the emergency room on 05/19/2015, CT scan of the brain done at that time showed some degree of small vessel ischemic changes that are chronic in nature. Given the changes in functional level, the patient is sent to this office for an evaluation.  Past Medical History  Diagnosis Date  . Anxiety   . ED (erectile dysfunction)   . ETOH abuse     liver disease  . Hematospermia   . Prostate cancer ([place])     chemo/radiation 2000  . Ventral hernia   . COPD (chronic obstructive pulmonary disease) ([place])     smoker  . Vitamin D deficiency   . Tobacco abuse   . Coronary artery disease 10/14/12    50-60% LAD  . Moderate aortic insufficiency 10/11/11    NL LVF  . Glaucoma   . PVD (peripheral vascular disease) ([place]) 10/14/12    dilated Ao root at cath  . Atrial fibrillation (H[facility])     NUC, 03/31/2009 - No ECG changes, EKG negative for ischemia, clinical correlation  recommeneded, mild anteroapical ischemia  . Chronic atrial fibrillation (H[facility])     not on Coumadin secondary to GI bleed  . RBBB   . Aortic valve disorder     2D ECHO, 11/18/2012 - EF 50-55%, normal  . Abnormality of gait 06/10/2015  . Memory change 06/10/2015  . Parkinsonism ([place]) 06/10/2015    Past Surgical History  Procedure Laterality Date  . Ventral hernia repair    . Cardiac catheterization  10/14/2012    Tubular moderate lesion in the mid LAD, proceed with FFR interrogation to determine physiologic significance  . Left heart catheterization with coronary angiogram N/A 10/14/2012    Procedure: LEFT HEART CATHETERIZATION WITH CORONARY ANGIOGRAM;  Surgeon: Lorretta Harp, MD;  Location: [facility] CATH LAB;  Service: Cardiovascular;  Laterality: N/A;    Family History  Problem Relation Age of Onset  . Emphysema Brother   . Cancer Brother     prostate  . Allergies      whole family  . Diabetes Sister   . Cancer Sister   . Diabetes Child   . Diabetes Child   . Diabetes Child   . Cancer Brother     prostate  . Cancer Brother     prostate  . Cancer Brother     colon  . Cancer Brother  colon    Social history:  reports that he has been smoking Cigarettes.  He has a 50 pack-year smoking history. He has never used smokeless tobacco. He reports that he does not drink alcohol or use illicit drugs.  Medications:  Prior to Admission medications   Medication Sig Start Date End Date Taking? Authorizing Provider  acetaminophen (TYLENOL) 325 MG tablet Take 2 tablets (650 mg total) by mouth every 4 (four) hours as needed. Patient taking differently: Take 650 mg by mouth every 4 (four) hours as needed for mild pain or moderate pain.  10/15/12  Yes Erlene Quan, PA-C  aspirin EC 81 MG tablet Take 81 mg by mouth daily.   Yes Historical Provider, MD  budesonide-formoterol (SYMBICORT) 160-4.5 MCG/ACT inhaler Inhale 2 puffs into the lungs 2 (two) times daily. 09/23/12  Yes Brand Males,  MD  buPROPion (WELLBUTRIN SR) 150 MG 12 hr tablet '150mg'$  x 3 days then increase to '150mg'$  twice a day x 12 weeks 02/25/13  Yes Brand Males, MD  docusate sodium 100 MG CAPS Take 100 mg by mouth 2 (two) times daily. Patient taking differently: Take 100 mg by mouth daily.  07/13/14  Yes Donne Hazel, MD  ergocalciferol (VITAMIN D2) 50000 UNITS capsule Take 50,000 Units by mouth 2 (two) times a week.   Yes Historical Provider, MD  ferrous sulfate 325 (65 FE) MG tablet Take 1 tablet (325 mg total) by mouth daily with breakfast. 07/13/14  Yes Donne Hazel, MD  isosorbide mononitrate (IMDUR) 30 MG 24 hr tablet Take 0.5 tablets (15 mg total) by mouth daily. 07/17/14  Yes Lorretta Harp, MD  latanoprost (XALATAN) 0.005 % ophthalmic solution Place 1 drop into both eyes at bedtime. 09/27/14  Yes Historical Provider, MD  metoprolol tartrate (LOPRESSOR) 25 MG tablet Take 1 tablet (25 mg total) by mouth 2 (two) times daily. 07/17/14  Yes Lorretta Harp, MD  nitroGLYCERIN (NITROSTAT) 0.4 MG SL tablet Place 0.4 mg under the tongue every 5 (five) minutes as needed for chest pain.   Yes Historical Provider, MD  simvastatin (ZOCOR) 5 MG tablet Take 1 tablet (5 mg total) by mouth at bedtime. MUST KEEP APPOINTMENT 07/17/14 WITH DR [name] FOR FUTURE REFILLS 06/23/14  Yes Lorretta Harp, MD  sodium chloride (OCEAN) 0.65 % SOLN nasal spray Place 1 spray into the nose daily as needed (For nosebleeds.).    Yes Historical Provider, MD     No Known Allergies  ROS:  Out of a complete 14 system review of symptoms, the patient complains only of the following symptoms, and all other reviewed systems are negative.  Fatigue Swelling in the legs Shortness of breath, cough, wheezing Incontinence of bowel and bladder Anemia Joint pain, joint swelling, aching muscles Confusion, numbness, weakness, slurred speech  Blood pressure 111/71, pulse 85, height '5\' 9"'$  (1.753 m), weight 179 lb (81.194 kg).  Physical  Exam  General: The patient is alert and cooperative at the time of the examination.  Eyes: Pupils are equal, round, and reactive to light. Discs are flat bilaterally.  Neck: The neck is supple, no carotid bruits are noted.  Respiratory: The respiratory examination is clear.  Cardiovascular: The cardiovascular examination reveals a regular rate and rhythm, no obvious murmurs or rubs are noted.  Skin: Extremities are without significant edema.  Neurologic Exam  Mental status: The patient is alert and oriented x 2 at the time of the examination (not oriented to date). The Mini-Mental Status Examination done today  shows a total score 21/30. The patient is able to name 10 animals in 30 seconds.  Cranial nerves: Facial symmetry is present. There is good sensation of the face to pinprick and soft touch bilaterally. The strength of the facial muscles and the muscles to head turning and shoulder shrug are normal bilaterally. Speech is slightly dysarthric. Extraocular movements are full, with exception that there may be some slight restriction of superior gaze. Visual fields are full. The tongue is midline, and the patient has symmetric elevation of the soft palate. No obvious hearing deficits are noted. Some masking of the face is seen. The patient is drooling.  Motor: The motor testing reveals 5 over 5 strength of all 4 extremities, with exception of some or/5 strength with the left deltoid. Good symmetric motor tone is noted throughout.  Sensory: Sensory testing is intact to pinprick, soft touch, vibration sensation, and position sense on all 4 extremities. No evidence of extinction is noted.  Coordination: Cerebellar testing reveals good finger-nose-finger and heel-to-shin bilaterally. No tremors are seen.  Gait and station: The patient is unable to rise from a seated position with arms crossed. Once up, the patient has some shuffling when he first initiates ambulation, and when he is turning.  With walking, he is slightly stooped but he has good arm swing bilaterally. Tandem gait is slightly unsteady. Romberg is negative.  Reflexes: Deep tendon reflexes are symmetric, but are depressed bilaterally. Toes are downgoing bilaterally.   CT head 05/19/15:  IMPRESSION: No acute intracranial findings.  Mild chronic ischemic microvascular disease and age related atrophic Change.  * CT scan images were reviewed online. I agree with the written report.    Assessment/Plan:  1. Parkinsonism  2. Gait disorder  3. Memory disorder  The patient has had an alteration in overall functional level over the last 6-8 months. The patient has developed a memory disorder, and a gait disorder with increased drooling, and slowness of movement. The patient has parkinsonism, but the speech is slightly dysarthric not dysphonic. The patient has good arm swing with walking. The patient will be sent for MRI evaluation of the brain to rule out vascular parkinsonism, and blood work will be done today. The patient will be placed on low-dose Sinemet taking 25/100 mg tablets, one half tablet 3 times daily. The patient will follow-up in 3 months.  Jill Alexanders MD 06/10/2015 7:08 PM  [facility] [address] [place], [place] [address]  Phone [phone] Fax [phone]

## 2015-06-10 NOTE — Patient Instructions (Addendum)
We will check blood work today and get MRI of the brain. I will start Sinemet for the walking.   Fall Prevention in the Home  Falls can cause injuries and can affect people from all age groups. There are many simple things that you can do to make your home safe and to help prevent falls. WHAT CAN I DO ON THE OUTSIDE OF MY HOME?  Regularly repair the edges of walkways and driveways and fix any cracks.  Remove high doorway thresholds.  Trim any shrubbery on the main path into your home.  Use bright outdoor lighting.  Clear walkways of debris and clutter, including tools and rocks.  Regularly check that handrails are securely fastened and in good repair. Both sides of any steps should have handrails.  Install guardrails along the edges of any raised decks or porches.  Have leaves, snow, and ice cleared regularly.  Use sand or salt on walkways during winter months.  In the garage, clean up any spills right away, including grease or oil spills. WHAT CAN I DO IN THE BATHROOM?  Use night lights.  Install grab bars by the toilet and in the tub and shower. Do not use towel bars as grab bars.  Use non-skid mats or decals on the floor of the tub or shower.  If you need to sit down while you are in the shower, use a plastic, non-slip stool.Marland Kitchen  Keep the floor dry. Immediately clean up any water that spills on the floor.  Remove soap buildup in the tub or shower on a regular basis.  Attach bath mats securely with double-sided non-slip rug tape.  Remove throw rugs and other tripping hazards from the floor. WHAT CAN I DO IN THE BEDROOM?  Use night lights.  Make sure that a bedside light is easy to reach.  Do not use oversized bedding that drapes onto the floor.  Have a firm chair that has side arms to use for getting dressed.  Remove throw rugs and other tripping hazards from the floor. WHAT CAN I DO IN THE KITCHEN?   Clean up any spills right away.  Avoid walking on wet  floors.  Place frequently used items in easy-to-reach places.  If you need to reach for something above you, use a sturdy step stool that has a grab bar.  Keep electrical cables out of the way.  Do not use floor polish or wax that makes floors slippery. If you have to use wax, make sure that it is non-skid floor wax.  Remove throw rugs and other tripping hazards from the floor. WHAT CAN I DO IN THE STAIRWAYS?  Do not leave any items on the stairs.  Make sure that there are handrails on both sides of the stairs. Fix handrails that are broken or loose. Make sure that handrails are as long as the stairways.  Check any carpeting to make sure that it is firmly attached to the stairs. Fix any carpet that is loose or worn.  Avoid having throw rugs at the top or bottom of stairways, or secure the rugs with carpet tape to prevent them from moving.  Make sure that you have a light switch at the top of the stairs and the bottom of the stairs. If you do not have them, have them installed. WHAT ARE SOME OTHER FALL PREVENTION TIPS?  Wear closed-toe shoes that fit well and support your feet. Wear shoes that have rubber soles or low heels.  When you use a  stepladder, make sure that it is completely opened and that the sides are firmly locked. Have someone hold the ladder while you are using it. Do not climb a closed stepladder.  Add color or contrast paint or tape to grab bars and handrails in your home. Place contrasting color strips on the first and last steps.  Use mobility aids as needed, such as canes, walkers, scooters, and crutches.  Turn on lights if it is dark. Replace any light bulbs that burn out.  Set up furniture so that there are clear paths. Keep the furniture in the same spot.  Fix any uneven floor surfaces.  Choose a carpet design that does not hide the edge of steps of a stairway.  Be aware of any and all pets.  Review your medicines with your healthcare provider. Some  medicines can cause dizziness or changes in blood pressure, which increase your risk of falling. Talk with your health care provider about other ways that you can decrease your risk of falls. This may include working with a physical therapist or trainer to improve your strength, balance, and endurance.   This information is not intended to replace advice given to you by your health care provider. Make sure you discuss any questions you have with your health care provider.   Document Released: 08/04/2002 Document Revised: 12/29/2014 Document Reviewed: 09/18/2014 Elsevier Interactive Patient Education [employer].

## 2015-06-11 ENCOUNTER — Ambulatory Visit
Admission: RE | Admit: 2015-06-11 | Discharge: 2015-06-11 | Disposition: A | Discharge status: Home / Self Care | Payer: Medicare Other | Source: Ambulatory Visit | Attending: Neurology | Admitting: Neurology

## 2015-06-11 DIAGNOSIS — R269 Unspecified abnormalities of gait and mobility: Secondary | ICD-10-CM

## 2015-06-11 DIAGNOSIS — R413 Other amnesia: Secondary | ICD-10-CM | POA: Diagnosis not present

## 2015-06-11 LAB — VITAMIN B12: VITAMIN B 12: 457 pg/mL (ref 211–946)

## 2015-06-11 LAB — RPR: RPR Ser Ql: NONREACTIVE

## 2015-06-11 LAB — SEDIMENTATION RATE: Sed Rate: 7 mm/hr (ref 0–30)

## 2015-06-11 LAB — COPPER, SERUM: Copper: 127 ug/dL (ref 72–166)

## 2015-06-14 ENCOUNTER — Telehealth: Payer: Self-pay | Admitting: Neurology

## 2015-06-14 NOTE — Telephone Encounter (Signed)
I called the patient. The MRI of the brain does show a moderate level small vessel disease. The patient is on Sinemet, if he gains benefit from this, we can increase the dose. The wife believes that the medications already reduce some of the tremors.  MRI brain 06/14/2015:  IMPRESSION:  Abnormal MRI brain (without) demonstrating: 1. Mild diffuse, moderate perisylvian and severe anterior/mesial temporal atrophy. 2. Mild-moderate periventricular and subcortical chronic small vessel ischemic disease.  3. No acute findings. No change from CT on 05/19/15.

## 2015-07-02 ENCOUNTER — Telehealth: Payer: Self-pay | Admitting: Cardiovascular Disease

## 2015-07-02 DIAGNOSIS — Z79899 Other long term (current) drug therapy: Secondary | ICD-10-CM

## 2015-07-02 LAB — BASIC METABOLIC PANEL
BUN / CREAT RATIO: 15 (ref 10–22)
BUN: 14 mg/dL (ref 8–27)
CO2: 31 mmol/L — ABNORMAL HIGH (ref 18–29)
CREATININE: 0.95 mg/dL (ref 0.76–1.27)
Calcium: 9.4 mg/dL (ref 8.6–10.2)
Chloride: 102 mmol/L (ref 97–106)
GFR calc Af Amer: 88 mL/min/{1.73_m2} (ref >59)
GFR calc non Af Amer: 76 mL/min/{1.73_m2} (ref >59)
GLUCOSE: 104 mg/dL — AB (ref 65–99)
Potassium: 4.8 mmol/L (ref 3.5–5.2)
Sodium: 140 mmol/L (ref 136–144)

## 2015-07-02 NOTE — Telephone Encounter (Signed)
BMET ordered for pre-CT BUN & Cr, pt aware and informed - will go to [place] site at [facility].

## 2015-07-02 NOTE — Telephone Encounter (Signed)
Stacy called in stating that lab orders for a BMET needed to be put in for the pt so he can have these done today. He is scheduled to have a CT done on 11/7. Please f/u with Stacy and the pt  Thanks

## 2015-07-05 ENCOUNTER — Ambulatory Visit (INDEPENDENT_AMBULATORY_CARE_PROVIDER_SITE_OTHER)
Admission: RE | Admit: 2015-07-05 | Discharge: 2015-07-05 | Disposition: A | Discharge status: Home / Self Care | Payer: Medicare Other | Source: Ambulatory Visit | Attending: Cardiovascular Disease | Admitting: Cardiovascular Disease

## 2015-07-05 DIAGNOSIS — I712 Thoracic aortic aneurysm, without rupture, unspecified: Secondary | ICD-10-CM

## 2015-07-05 MED ORDER — IOHEXOL 350 MG/ML SOLN
100.0000 mL | Freq: Once | INTRAVENOUS | Status: AC | PRN
  Administered 2015-07-05: 100 mL via INTRAVENOUS

## 2015-07-09 ENCOUNTER — Telehealth: Payer: Self-pay | Admitting: Internal Medicine

## 2015-07-09 DIAGNOSIS — R911 Solitary pulmonary nodule: Secondary | ICD-10-CM

## 2015-07-09 NOTE — Telephone Encounter (Signed)
lmtcb X1 for pt.  Cannot call to request super-D as office is closed (after 5:00) Will call back Monday.

## 2015-07-09 NOTE — Telephone Encounter (Signed)
Mitchell Wood (fyi copy to Dr [name] )  Dr [name] got CT chest which showed > 3cm nodule that has enlarged from prior. I have not seen him in 2 years. Please  A) get PET scan B) Get SUpER d of that CT chest  After that  - visit with me or [facility] Apo  Thanks  Dr. Brand Males, M.D., [facility].C.P Pulmonary and Critical Care Medicine Staff Physician [place] [facility] Pager: [phone], If no answer or between  15:00h - 7:00h: call [phone]  07/09/2015 1:37 PM

## 2015-07-12 NOTE — Telephone Encounter (Signed)
Spoke with CT and they can do the super-D but it will be a lot more images than normal d/t it being a CTa. They will send this over to [place] once done.  lmtcb for pt x 2

## 2015-07-14 ENCOUNTER — Telehealth: Payer: Self-pay | Admitting: Emergency Medicine

## 2015-07-14 NOTE — Telephone Encounter (Signed)
lmtcb x1 for pt. Needs ROV with RB following his PET scan that's scheduled on 07/23/2015 at 12pm.

## 2015-07-14 NOTE — Telephone Encounter (Signed)
Spoke with the pt and notified of recs per MR  He verbalized understanding  Order sent to [facility] for PET scan and then ov with RB

## 2015-07-15 NOTE — Telephone Encounter (Signed)
lmtcb x2 for pt. 

## 2015-07-19 NOTE — Telephone Encounter (Signed)
lmtcb x3 for pt. 

## 2015-07-20 NOTE — Telephone Encounter (Signed)
Spoke with pt's wife. She had no idea why we were ordering PET scan. Explained this to her and apologized that some one did not explain this to them. They will need the PET scan rescheduled due to a death in his family. This has been rescheduled to 07/28/15 at 2pm. Pt's wife is aware of this change. OV with RB has been scheduled for 08/03/15 at 1:30pm. Nothing further was needed at this time.

## 2015-07-20 NOTE — Telephone Encounter (Signed)
NA for RB is not until 09/28/15. Can RB be overbooked?

## 2015-07-23 ENCOUNTER — Ambulatory Visit (HOSPITAL_COMMUNITY): Payer: Medicare Other

## 2015-07-28 ENCOUNTER — Telehealth: Payer: Self-pay | Admitting: Internal Medicine

## 2015-07-28 ENCOUNTER — Ambulatory Visit (HOSPITAL_COMMUNITY)
Admission: RE | Admit: 2015-07-28 | Discharge: 2015-07-28 | Disposition: A | Discharge status: Home / Self Care | Payer: Medicare Other | Source: Ambulatory Visit | Attending: Internal Medicine | Admitting: Internal Medicine

## 2015-07-28 DIAGNOSIS — R911 Solitary pulmonary nodule: Secondary | ICD-10-CM | POA: Diagnosis present

## 2015-07-28 DIAGNOSIS — Z0189 Encounter for other specified special examinations: Secondary | ICD-10-CM | POA: Insufficient documentation

## 2015-07-28 DIAGNOSIS — I2510 Atherosclerotic heart disease of native coronary artery without angina pectoris: Secondary | ICD-10-CM | POA: Diagnosis not present

## 2015-07-28 DIAGNOSIS — K8020 Calculus of gallbladder without cholecystitis without obstruction: Secondary | ICD-10-CM | POA: Insufficient documentation

## 2015-07-28 DIAGNOSIS — R590 Localized enlarged lymph nodes: Secondary | ICD-10-CM | POA: Insufficient documentation

## 2015-07-28 DIAGNOSIS — I712 Thoracic aortic aneurysm, without rupture: Secondary | ICD-10-CM | POA: Diagnosis not present

## 2015-07-28 LAB — GLUCOSE, CAPILLARY: GLUCOSE-CAPILLARY: 87 mg/dL (ref 65–99)

## 2015-07-28 MED ORDER — FLUDEOXYGLUCOSE F - 18 (FDG) INJECTION: 10.5000 | Freq: Once | INTRAVENOUS | Status: DC | PRN

## 2015-07-28 NOTE — Telephone Encounter (Signed)
PEt scan positive - he should keep up his appt with Dr Lamonte Sakai 08/03/15 Also, if you go through phone messages there is talk of super d going to [facility] - make sure [facility] has it for his OV  THanks  Dr. Brand Males, M.D., [facility].C.P Pulmonary and Critical Care Medicine Staff Physician [place] [facility] Pager: [phone], If no answer or between  15:00h - 7:00h: call [phone]  07/28/2015 8:18 PM       Nm Pet Image Initial (pi) Skull Base To Thigh  07/28/2015  CLINICAL DATA:  Initial treatment strategy for solitary pulmonary nodule. EXAM: NUCLEAR MEDICINE PET SKULL BASE TO THIGH TECHNIQUE: 10.5 mCi F-18 FDG was injected intravenously. Full-ring PET imaging was performed from the skull base to thigh after the radiotracer. CT data was obtained and used for attenuation correction and anatomic localization. FASTING BLOOD GLUCOSE:  Value: 87 mg/dl COMPARISON:  CT chest 07/05/2015 07/22/2014, 11/10/2013 and 09/27/2012. FINDINGS: NECK No hypermetabolic lymph nodes in the neck. CT images show no acute findings. CHEST No hypermetabolic mediastinal, hilar or axillary lymph nodes. A bilobed lesion in the posterior segment right upper lobe measures approximately 2.0 x 2.6 cm (CT image 23, series 6) with an SUV max of 17.4. A nodule in the medial segment right middle lobe measures roughly 9 mm (image 41), given motion artifact, with an SUV max of 4.7. CT images show the ascending aorta to measure approximately 4.5 cm. Three-vessel coronary artery calcification. Heart is mildly enlarged. No pericardial or pleural effusion. Emphysematous changes are seen in the upper lobes. ABDOMEN/PELVIS No abnormal hypermetabolism in the liver, adrenal glands, spleen or pancreas. A hypermetabolic left external iliac lymph node measures 12 mm (series 4, image 158) with an SUV max of 10.0. A 7 mm left inguinal lymph node (CT image 167) is borderline hypermetabolic, SUV max 2.9. CT images  show the liver to be grossly unremarkable. Granular stones layer in the gallbladder. Adrenal glands are unremarkable. Hyper attenuating lesions in the upper pole right kidney are sub cm in size and too small to characterize. Kidneys, spleen, pancreas, stomach and bowel are otherwise grossly unremarkable. SKELETON No abnormal osseous hypermetabolism. IMPRESSION: 1. Enlarging hypermetabolic right upper lobe nodule is highly worrisome for primary bronchogenic carcinoma. Hypermetabolic right middle lobe nodule may represent a synchronous lesion. 2. Hypermetabolic left external iliac lymph node is clearly abnormal but is considered an unusual site for metastatic disease from primary bronchogenic carcinoma. 3. Ascending aortic aneurysm. Ascending thoracic aortic aneurysm. Recommend semi-annual imaging followup by CTA or MRA and referral to cardiothoracic surgery if not already obtained. This recommendation follows 2010 ACCF/AHA/AATS/ACR/ASA/SCA/SCAI/SIR/STS/SVM Guidelines for the Diagnosis and Management of Patients With Thoracic Aortic Disease. Circulation. 2010; 121: I203-T597. 4. Coronary artery calcification. 5. Cholelithiasis. Electronically Signed   By: Lorin Picket M.D.   On: 07/28/2015 15:48

## 2015-07-29 NOTE — Telephone Encounter (Signed)
Called spoke with pt. Made aware he needs to keep appt with RB on 12/6 at 1:30. He will do so.  Dr. Lamonte Sakai, do you know if you were given the super D disc on this pt that was requested?

## 2015-07-29 NOTE — Telephone Encounter (Signed)
Yes, I have the super D disc.

## 2015-08-03 ENCOUNTER — Ambulatory Visit (INDEPENDENT_AMBULATORY_CARE_PROVIDER_SITE_OTHER): Payer: Medicare Other | Admitting: Emergency Medicine

## 2015-08-03 ENCOUNTER — Encounter: Payer: Self-pay | Admitting: Emergency Medicine

## 2015-08-03 VITALS — BP 102/58 | HR 90 | Ht 70.5 in | Wt 183.0 lb

## 2015-08-03 DIAGNOSIS — F17200 Nicotine dependence, unspecified, uncomplicated: Secondary | ICD-10-CM

## 2015-08-03 DIAGNOSIS — Z720 Tobacco use: Secondary | ICD-10-CM | POA: Diagnosis not present

## 2015-08-03 DIAGNOSIS — R918 Other nonspecific abnormal finding of lung field: Secondary | ICD-10-CM | POA: Diagnosis not present

## 2015-08-03 DIAGNOSIS — C3490 Malignant neoplasm of unspecified part of unspecified bronchus or lung: Secondary | ICD-10-CM | POA: Insufficient documentation

## 2015-08-03 NOTE — Progress Notes (Signed)
Subjective:    Patient ID: Mitchell Wood, male    DOB: 11/12/1933, 80 y.o.   MRN: [id number]  HPI 80 yo active smoker ( 50 pack years) with a history of  Alcohol abuse, prostate cancer with history of radiation, coronary artery disease, atrial fibrillation, moderate aortic insufficiency, seen previously by Dr Chase Caller.  He underwent CT scan of the chest 07/05/15 to follow right-sided pulmonary nodule. His revealed interval enlargement. A PET scan was performed on 07/28/15 hat showed a hypermetabolic bilobed right upper lobe lesion measuring 2.0 x 2.6 centimeters. He is referred for further evaluation of this new problem. His wife is present and assists with the history. His FEV1 on 08/09/12 was 1.72 L, 66% predicted. Diffusion capacity 61% predicted.      Review of Systems As per HPI  Past Medical History  Diagnosis Date  . Anxiety   . ED (erectile dysfunction)   . ETOH abuse     liver disease  . Hematospermia   . Prostate cancer ([place])     chemo/radiation 2000  . Ventral hernia   . COPD (chronic obstructive pulmonary disease) [place]     smoker  . Vitamin D deficiency   . Tobacco abuse   . Coronary artery disease 10/14/12    50-60% LAD  . Moderate aortic insufficiency 10/11/11    NL LVF  . Glaucoma   . PVD (peripheral vascular disease) ([place]) 10/14/12    dilated Ao root at cath  . Atrial fibrillation (HCC)     NUC, 03/31/2009 - No ECG changes, EKG negative for ischemia, clinical correlation recommeneded, mild anteroapical ischemia  . Chronic atrial fibrillation (HCC)     not on Coumadin secondary to GI bleed  . RBBB   . Aortic valve disorder     2D ECHO, 11/18/2012 - EF 50-55%, normal  . Abnormality of gait 06/10/2015  . Memory change 06/10/2015  . Parkinsonism ([place]) 06/10/2015     Family History  Problem Relation Age of Onset  . Emphysema Brother   . Cancer Brother     prostate  . Allergies      whole family  . Diabetes Sister   . Cancer Sister   . Diabetes Child   .  Diabetes Child   . Diabetes Child   . Cancer Brother     prostate  . Cancer Brother     prostate  . Cancer Brother     colon  . Cancer Brother     colon  Brother with lung cancer.   Social History   Social History  . Marital Status: Married    Spouse Name: N/A  . Number of Children: 4  . Years of Education: N/A   Occupational History  . retired    Social History Main Topics  . Smoking status: Current Every Day Smoker -- 1.00 packs/day for 50 years    Types: Cigarettes  . Smokeless tobacco: Never Used     Comment: 30+ yr  . Alcohol Use: No  . Drug Use: No  . Sexual Activity: Not on file   Other Topics Concern  . Not on file   Social History Narrative   Patient drinks about 4-5 cups of caffeine daily.   Patient is right handed.      No Known Allergies   Outpatient Prescriptions Prior to Visit  Medication Sig Dispense Refill  . acetaminophen (TYLENOL) 325 MG tablet Take 2 tablets (650 mg total) by mouth every 4 (four) hours as needed. (Patient  taking differently: Take 650 mg by mouth every 4 (four) hours as needed for mild pain or moderate pain. )    . aspirin EC 81 MG tablet Take 81 mg by mouth daily.    . budesonide-formoterol (SYMBICORT) 160-4.5 MCG/ACT inhaler Inhale 2 puffs into the lungs 2 (two) times daily. 1 Inhaler 6  . buPROPion (WELLBUTRIN SR) 150 MG 12 hr tablet '150mg'$  x 3 days then increase to '150mg'$  twice a day x 12 weeks 65 tablet 1  . carbidopa-levodopa (SINEMET IR) 25-100 MG tablet Take 0.5 tablets by mouth 3 (three) times daily. 50 tablet 2  . docusate sodium 100 MG CAPS Take 100 mg by mouth 2 (two) times daily. (Patient taking differently: Take 100 mg by mouth daily. ) 10 capsule 0  . ergocalciferol (VITAMIN D2) 50000 UNITS capsule Take 50,000 Units by mouth 2 (two) times a week.    . ferrous sulfate 325 (65 FE) MG tablet Take 1 tablet (325 mg total) by mouth daily with breakfast. 30 tablet 0  . isosorbide mononitrate (IMDUR) 30 MG 24 hr tablet Take  0.5 tablets (15 mg total) by mouth daily. 45 tablet 6  . latanoprost (XALATAN) 0.005 % ophthalmic solution Place 1 drop into both eyes at bedtime.  6  . metoprolol tartrate (LOPRESSOR) 25 MG tablet Take 1 tablet (25 mg total) by mouth 2 (two) times daily. 180 tablet 3  . nitroGLYCERIN (NITROSTAT) 0.4 MG SL tablet Place 0.4 mg under the tongue every 5 (five) minutes as needed for chest pain.    . sodium chloride (OCEAN) 0.65 % SOLN nasal spray Place 1 spray into the nose daily as needed (For nosebleeds.).     Marland Kitchen simvastatin (ZOCOR) 5 MG tablet Take 1 tablet (5 mg total) by mouth at bedtime. MUST KEEP APPOINTMENT 07/17/14 WITH DR BERRY FOR FUTURE REFILLS 30 tablet 0   No facility-administered medications prior to visit.          Objective:   Physical Exam     PET scan 07/28/15 --  COMPARISON: CT chest 07/05/2015 07/22/2014, 11/10/2013 and 09/27/2012.  FINDINGS: NECK  No hypermetabolic lymph nodes in the neck. CT images show no acute findings.  CHEST  No hypermetabolic mediastinal, hilar or axillary lymph nodes. A bilobed lesion in the posterior segment right upper lobe measures approximately 2.0 x 2.6 cm (CT image 23, series 6) with an SUV max of 17.4. A nodule in the medial segment right middle lobe measures roughly 9 mm (image 41), given motion artifact, with an SUV max of 4.7. CT images show the ascending aorta to measure approximately 4.5 cm. Three-vessel coronary artery calcification. Heart is mildly enlarged. No pericardial or pleural effusion. Emphysematous changes are seen in the upper lobes.  ABDOMEN/PELVIS  No abnormal hypermetabolism in the liver, adrenal glands, spleen or pancreas. A hypermetabolic left external iliac lymph node measures 12 mm (series 4, image 158) with an SUV max of 10.0. A 7 mm left inguinal lymph node (CT image 167) is borderline hypermetabolic, SUV max 2.9. CT images show the liver to be grossly unremarkable. Granular stones layer  in the gallbladder. Adrenal glands are unremarkable. Hyper attenuating lesions in the upper pole right kidney are sub cm in size and too small to characterize. Kidneys, spleen, pancreas, stomach and bowel are otherwise grossly unremarkable.  SKELETON  No abnormal osseous hypermetabolism.  IMPRESSION: 1. Enlarging hypermetabolic right upper lobe nodule is highly worrisome for primary bronchogenic carcinoma. Hypermetabolic right middle lobe nodule may represent a synchronous  lesion. 2. Hypermetabolic left external iliac lymph node is clearly abnormal but is considered an unusual site for metastatic disease from primary bronchogenic carcinoma. 3. Ascending aortic aneurysm. Ascending thoracic aortic aneurysm. Recommend semi-annual imaging followup by CTA or MRA and referral to cardiothoracic surgery if not already obtained. This recommendation follows 2010 ACCF/AHA/AATS/ACR/ASA/SCA/SCAI/SIR/STS/SVM Guidelines for the Diagnosis and Management of Patients With Thoracic Aortic Disease. Circulation. 2010; 121: K935-L217. 4. Coronary artery calcification. 5. Cholelithiasis.     Assessment & Plan:  Smoker Discussed cessation, he is not ready to stop  Pulmonary nodules/lesions, multiple RUL and RML nodules hypermetabolic on PET. Given his medical problems and functional capacity believed that transthoracic needle biopsy would be the most appropriate  Modality for diagnosis at this time. i would like to avoid general  Anesthesia if possible.

## 2015-08-03 NOTE — Assessment & Plan Note (Signed)
Discussed cessation, he is not ready to stop

## 2015-08-03 NOTE — Patient Instructions (Signed)
We will arrange for a biopsy of your right lung nodule in Interventional radiology You need to work on quitting smoking  Follow up with Dr Lamonte Sakai in 2 weeks to review your results.

## 2015-08-03 NOTE — Assessment & Plan Note (Signed)
RUL and RML nodules hypermetabolic on PET. Given his medical problems and functional capacity believed that transthoracic needle biopsy would be the most appropriate  Modality for diagnosis at this time. i would like to avoid general  Anesthesia if possible.

## 2015-08-05 ENCOUNTER — Other Ambulatory Visit: Payer: Self-pay | Admitting: Radiology

## 2015-08-10 ENCOUNTER — Other Ambulatory Visit: Payer: Self-pay | Admitting: Radiology

## 2015-08-11 ENCOUNTER — Encounter (HOSPITAL_COMMUNITY): Payer: Self-pay

## 2015-08-11 ENCOUNTER — Ambulatory Visit (HOSPITAL_COMMUNITY): Admission: RE | Admit: 2015-08-11 | Payer: Medicare Other | Source: Ambulatory Visit

## 2015-08-11 ENCOUNTER — Ambulatory Visit (HOSPITAL_COMMUNITY)
Admission: RE | Admit: 2015-08-11 | Discharge: 2015-08-11 | Disposition: A | Discharge status: Home / Self Care | Payer: Medicare Other | Source: Ambulatory Visit | Attending: Emergency Medicine | Admitting: Emergency Medicine

## 2015-08-11 ENCOUNTER — Ambulatory Visit (HOSPITAL_COMMUNITY)
Admission: RE | Admit: 2015-08-11 | Discharge: 2015-08-11 | Disposition: A | Discharge status: Home / Self Care | Payer: Medicare Other | Source: Ambulatory Visit | Attending: Interventional Radiology | Admitting: Interventional Radiology

## 2015-08-11 DIAGNOSIS — Z9889 Other specified postprocedural states: Secondary | ICD-10-CM | POA: Insufficient documentation

## 2015-08-11 DIAGNOSIS — R918 Other nonspecific abnormal finding of lung field: Secondary | ICD-10-CM

## 2015-08-11 DIAGNOSIS — C3411 Malignant neoplasm of upper lobe, right bronchus or lung: Secondary | ICD-10-CM | POA: Insufficient documentation

## 2015-08-11 DIAGNOSIS — J95811 Postprocedural pneumothorax: Secondary | ICD-10-CM

## 2015-08-11 DIAGNOSIS — R911 Solitary pulmonary nodule: Secondary | ICD-10-CM | POA: Insufficient documentation

## 2015-08-11 LAB — PROTIME-INR
INR: 1.14 (ref 0.00–1.49)
Prothrombin Time: 14.8 seconds (ref 11.6–15.2)

## 2015-08-11 LAB — CBC
HCT: 41.2 % (ref 39.0–52.0)
Hemoglobin: 13.2 g/dL (ref 13.0–17.0)
MCH: 27.7 pg (ref 26.0–34.0)
MCHC: 32 g/dL (ref 30.0–36.0)
MCV: 86.4 fL (ref 78.0–100.0)
PLATELETS: 196 10*3/uL (ref 150–400)
RBC: 4.77 MIL/uL (ref 4.22–5.81)
RDW: 16.7 % — ABNORMAL HIGH (ref 11.5–15.5)
WBC: 8.5 10*3/uL (ref 4.0–10.5)

## 2015-08-11 LAB — APTT: aPTT: 32 seconds (ref 24–37)

## 2015-08-11 MED ORDER — SODIUM CHLORIDE 0.9 % IV SOLN
Freq: Once | INTRAVENOUS | Status: AC
  Administered 2015-08-11: 08:00:00 via INTRAVENOUS

## 2015-08-11 MED ORDER — SODIUM CHLORIDE 0.9 % IV SOLN
INTRAVENOUS | Status: AC | PRN
  Administered 2015-08-11: 10 mL/h via INTRAVENOUS

## 2015-08-11 MED ORDER — FENTANYL CITRATE (PF) 100 MCG/2ML IJ SOLN
INTRAMUSCULAR | Status: AC
  Filled 2015-08-11: qty 2

## 2015-08-11 MED ORDER — FENTANYL CITRATE (PF) 100 MCG/2ML IJ SOLN
INTRAMUSCULAR | Status: AC | PRN
  Administered 2015-08-11: 25 ug via INTRAVENOUS

## 2015-08-11 MED ORDER — LIDOCAINE HCL 1 % IJ SOLN
INTRAMUSCULAR | Status: AC
  Filled 2015-08-11: qty 20

## 2015-08-11 MED ORDER — MIDAZOLAM HCL 2 MG/2ML IJ SOLN
INTRAMUSCULAR | Status: AC | PRN
  Administered 2015-08-11: 1 mg via INTRAVENOUS

## 2015-08-11 MED ORDER — MIDAZOLAM HCL 2 MG/2ML IJ SOLN
INTRAMUSCULAR | Status: AC
  Filled 2015-08-11: qty 2

## 2015-08-11 MED ORDER — HYDROCODONE-ACETAMINOPHEN 5-325 MG PO TABS: 1.0000 | ORAL_TABLET | ORAL | Status: DC | PRN

## 2015-08-11 NOTE — Sedation Documentation (Signed)
Patient denies pain and is resting comfortably.  

## 2015-08-11 NOTE — Sedation Documentation (Addendum)
RN suctioning pt- moderate amount of hemoptysis, sats 93 6L/[place]

## 2015-08-11 NOTE — Sedation Documentation (Signed)
Patient is resting comfortably. 

## 2015-08-11 NOTE — Procedures (Signed)
CT 18g core x2 RUL nodule, to surg path Regional alveolar bleed, no ptx. Some hemoptysis See complete dictation in [facility].

## 2015-08-11 NOTE — Discharge Instructions (Signed)

## 2015-08-11 NOTE — Sedation Documentation (Signed)
Will monitor pt at RN station until able to transfer to [facility]

## 2015-08-11 NOTE — Sedation Documentation (Signed)
Patient denies pain and is resting comfortably. Wife in room and Dr Vernard Gambles spoke to her re biopsy. No further hemoptysis. Called for portable CXR

## 2015-08-11 NOTE — Sedation Documentation (Signed)
CXR done. Dr Vernard Gambles aware

## 2015-08-11 NOTE — Sedation Documentation (Signed)
Patient denies pain and is resting comfortably. In [place] right side lying, no further hemoptysis or coughing. O2 decreased to 5L [place]

## 2015-08-11 NOTE — Sedation Documentation (Signed)
MD wants pt R side down, will continue to monitor. Hemoptysis has stopped

## 2015-08-11 NOTE — Sedation Documentation (Signed)
Voided 200cc in urinal.

## 2015-08-11 NOTE — Sedation Documentation (Signed)
Bandaid to R upper back intact

## 2015-08-11 NOTE — H&P (Signed)
Chief Complaint: Patient was seen in consultation today for lung mass biopsy at the request of Byrum,Robert S  Referring Physician(s): Byrum,Robert S  History of Present Illness: Mitchell Wood is a 80 y.o. male with hypermetabolic right lung nodule. He is referred for CT guided percutaneous biopsy. PMHx, meds, labs, allergies, imaging reviewed. Feels well this morning, a little tired. Has been NPO  Past Medical History  Diagnosis Date  . Anxiety   . ED (erectile dysfunction)   . ETOH abuse     liver disease  . Hematospermia   . Prostate cancer ([place])     chemo/radiation 2000  . Ventral hernia   . COPD (chronic obstructive pulmonary disease) ([place])     smoker  . Vitamin D deficiency   . Tobacco abuse   . Coronary artery disease 10/14/12    50-60% LAD  . Moderate aortic insufficiency 10/11/11    NL LVF  . Glaucoma   . PVD (peripheral vascular disease) ([place]) 10/14/12    dilated Ao root at cath  . Atrial fibrillation (HCC)     NUC, 03/31/2009 - No ECG changes, EKG negative for ischemia, clinical correlation recommeneded, mild anteroapical ischemia  . Chronic atrial fibrillation (HCC)     not on Coumadin secondary to GI bleed  . RBBB   . Aortic valve disorder     2D ECHO, 11/18/2012 - EF 50-55%, normal  . Abnormality of gait 06/10/2015  . Memory change 06/10/2015  . Parkinsonism ([place]) 06/10/2015    Past Surgical History  Procedure Laterality Date  . Ventral hernia repair    . Cardiac catheterization  10/14/2012    Tubular moderate lesion in the mid LAD, proceed with FFR interrogation to determine physiologic significance  . Left heart catheterization with coronary angiogram N/A 10/14/2012    Procedure: LEFT HEART CATHETERIZATION WITH CORONARY ANGIOGRAM;  Surgeon: Lorretta Harp, MD;  Location: [facility] CATH LAB;  Service: Cardiovascular;  Laterality: N/A;    Allergies: Review of patient's allergies indicates no known allergies.  Medications: Prior to Admission medications    Medication Sig Start Date End Date Taking? Authorizing Provider  acetaminophen (TYLENOL) 325 MG tablet Take 2 tablets (650 mg total) by mouth every 4 (four) hours as needed. Patient taking differently: Take 650 mg by mouth every 4 (four) hours as needed for mild pain or moderate pain.  10/15/12  Yes Erlene Quan, PA-C  aspirin EC 81 MG tablet Take 81 mg by mouth daily.   Yes Historical Provider, MD  budesonide-formoterol (SYMBICORT) 160-4.5 MCG/ACT inhaler Inhale 2 puffs into the lungs 2 (two) times daily. 09/23/12  Yes Brand Males, MD  carbidopa-levodopa (SINEMET IR) 25-100 MG tablet Take 0.5 tablets by mouth 3 (three) times daily. 06/10/15  Yes Kathrynn Ducking, MD  docusate sodium 100 MG CAPS Take 100 mg by mouth 2 (two) times daily. Patient taking differently: Take 100 mg by mouth daily.  07/13/14  Yes Donne Hazel, MD  ergocalciferol (VITAMIN D2) 50000 UNITS capsule Take 50,000 Units by mouth 2 (two) times a week.   Yes Historical Provider, MD  ferrous sulfate 325 (65 FE) MG tablet Take 1 tablet (325 mg total) by mouth daily with breakfast. 07/13/14  Yes Donne Hazel, MD  isosorbide mononitrate (IMDUR) 30 MG 24 hr tablet Take 0.5 tablets (15 mg total) by mouth daily. 07/17/14  Yes Lorretta Harp, MD  latanoprost (XALATAN) 0.005 % ophthalmic solution Place 1 drop into both eyes at bedtime. 09/27/14  Yes  Historical Provider, MD  metoprolol tartrate (LOPRESSOR) 25 MG tablet Take 1 tablet (25 mg total) by mouth 2 (two) times daily. 07/17/14  Yes Lorretta Harp, MD  nitroGLYCERIN (NITROSTAT) 0.4 MG SL tablet Place 0.4 mg under the tongue every 5 (five) minutes as needed for chest pain.    Historical Provider, MD  sodium chloride (OCEAN) 0.65 % SOLN nasal spray Place 1 spray into both nostrils 3 (three) times daily as needed (For nosebleeds.).     Historical Provider, MD     Family History  Problem Relation Age of Onset  . Emphysema Brother   . Cancer Brother     prostate  .  Allergies      whole family  . Diabetes Sister   . Cancer Sister   . Diabetes Child   . Diabetes Child   . Diabetes Child   . Cancer Brother     prostate  . Cancer Brother     prostate  . Cancer Brother     colon  . Cancer Brother     colon    Social History   Social History  . Marital Status: Married    Spouse Name: N/A  . Number of Children: 4  . Years of Education: N/A   Occupational History  . retired    Social History Main Topics  . Smoking status: Current Every Day Smoker -- 1.00 packs/day for 50 years    Types: Cigarettes  . Smokeless tobacco: Never Used     Comment: 30+ yr  . Alcohol Use: No  . Drug Use: No  . Sexual Activity: Not Asked   Other Topics Concern  . None   Social History Narrative   Patient drinks about 4-5 cups of caffeine daily.   Patient is right handed.     Review of Systems: A 12 point ROS discussed and pertinent positives are indicated in the HPI above.  All other systems are negative.  Review of Systems  Vital Signs: BP 123/76 mmHg  Pulse 104  Temp(Src) 97.7 F (36.5 C)  Resp 16  Ht '5\' 9"'$  (1.753 m)  Wt 180 lb (81.647 kg)  BMI 26.57 kg/m2  SpO2 96%  Physical Exam  Constitutional: He is oriented to person, place, and time. He appears well-developed and well-nourished. No distress.  HENT:  Head: Normocephalic.  Mouth/Throat: Oropharynx is clear and moist.  Neck: Normal range of motion. No tracheal deviation present.  Cardiovascular: Normal rate, regular rhythm and normal heart sounds.   Pulmonary/Chest: Effort normal and breath sounds normal. No respiratory distress. He has no wheezes.  Abdominal: Soft. He exhibits no distension. There is no tenderness.  Neurological: He is alert and oriented to person, place, and time.  Skin: Skin is warm and dry.  Psychiatric: He has a normal mood and affect. Judgment normal.    Mallampati Score:  MD Evaluation Airway: WNL Heart: WNL Abdomen: WNL Chest/ Lungs: WNL ASA   Classification: 3 Mallampati/Airway Score: One  Imaging: Nm Pet Image Initial (pi) Skull Base To Thigh  07/28/2015  CLINICAL DATA:  Initial treatment strategy for solitary pulmonary nodule. EXAM: NUCLEAR MEDICINE PET SKULL BASE TO THIGH TECHNIQUE: 10.5 mCi F-18 FDG was injected intravenously. Full-ring PET imaging was performed from the skull base to thigh after the radiotracer. CT data was obtained and used for attenuation correction and anatomic localization. FASTING BLOOD GLUCOSE:  Value: 87 mg/dl COMPARISON:  CT chest 07/05/2015 07/22/2014, 11/10/2013 and 09/27/2012. FINDINGS: NECK No hypermetabolic lymph nodes in the  neck. CT images show no acute findings. CHEST No hypermetabolic mediastinal, hilar or axillary lymph nodes. A bilobed lesion in the posterior segment right upper lobe measures approximately 2.0 x 2.6 cm (CT image 23, series 6) with an SUV max of 17.4. A nodule in the medial segment right middle lobe measures roughly 9 mm (image 41), given motion artifact, with an SUV max of 4.7. CT images show the ascending aorta to measure approximately 4.5 cm. Three-vessel coronary artery calcification. Heart is mildly enlarged. No pericardial or pleural effusion. Emphysematous changes are seen in the upper lobes. ABDOMEN/PELVIS No abnormal hypermetabolism in the liver, adrenal glands, spleen or pancreas. A hypermetabolic left external iliac lymph node measures 12 mm (series 4, image 158) with an SUV max of 10.0. A 7 mm left inguinal lymph node (CT image 167) is borderline hypermetabolic, SUV max 2.9. CT images show the liver to be grossly unremarkable. Granular stones layer in the gallbladder. Adrenal glands are unremarkable. Hyper attenuating lesions in the upper pole right kidney are sub cm in size and too small to characterize. Kidneys, spleen, pancreas, stomach and bowel are otherwise grossly unremarkable. SKELETON No abnormal osseous hypermetabolism. IMPRESSION: 1. Enlarging hypermetabolic right upper  lobe nodule is highly worrisome for primary bronchogenic carcinoma. Hypermetabolic right middle lobe nodule may represent a synchronous lesion. 2. Hypermetabolic left external iliac lymph node is clearly abnormal but is considered an unusual site for metastatic disease from primary bronchogenic carcinoma. 3. Ascending aortic aneurysm. Ascending thoracic aortic aneurysm. Recommend semi-annual imaging followup by CTA or MRA and referral to cardiothoracic surgery if not already obtained. This recommendation follows 2010 ACCF/AHA/AATS/ACR/ASA/SCA/SCAI/SIR/STS/SVM Guidelines for the Diagnosis and Management of Patients With Thoracic Aortic Disease. Circulation. 2010; 121: I097-D532. 4. Coronary artery calcification. 5. Cholelithiasis. Electronically Signed   By: Lorin Picket M.D.   On: 07/28/2015 15:48    Labs:  CBC:  Recent Labs  12/14/14 1204 05/19/15 1346 08/11/15 0813  WBC 9.1 6.9 8.5  HGB 8.9* 13.2 13.2  HCT 28.0* 39.5 41.2  PLT 278 194 196    COAGS:  Recent Labs  12/14/14 1204 08/11/15 0813  INR 1.16 1.14  APTT  --  32    BMP:  Recent Labs  12/14/14 1204 05/19/15 1346 07/02/15 1422  NA 139 139 140  K 4.2 3.7 4.8  CL 104 102 102  CO2 26 30 31*  GLUCOSE 106* 94 104*  BUN '16 16 14  '$ CALCIUM 9.0 9.3 9.4  CREATININE 0.99 1.02 0.95  GFRNONAA 76* >60 76  GFRAA 89* >60 88    LIVER FUNCTION TESTS:  Recent Labs  12/14/14 1204  BILITOT 0.6  AST 24  ALT 14  ALKPHOS 52  PROT 6.8  ALBUMIN 3.2*    TUMOR MARKERS: No results for input(s): AFPTM, CEA, CA199, CHROMGRNA in the last 8760 hours.  Assessment and Plan: (R)lung nodule/ass For CT guided biopsy Labs ok Risks and Benefits discussed with the patient including, but not limited to bleeding, hemoptysis, respiratory failure requiring intubation, infection, pneumothorax requiring chest tube placement, stroke from air embolism or even death. All of the patient's questions were answered, patient is agreeable to  proceed. Consent signed and in chart.    Thank you for this interesting consult.  A copy of this report was sent to the requesting provider on this date.  SignedAscencion Dike 08/11/2015, 8:38 AM   I spent a total of 18 minutes in face to face in clinical consultation, greater than 50% of which was counseling/coordinating  care for lung nodule biopsy

## 2015-08-11 NOTE — Sedation Documentation (Addendum)
Coughing with hemoptysis, suctioned for moderate amounts of blood. Dr Vernard Gambles present and aware. Maintained right side lying as per Dr Vernard Gambles. O2 Spo2 decreased to 91%. O2 increased to 6 L .

## 2015-08-11 NOTE — Sedation Documentation (Signed)
Patient denies pain and is resting comfortably. Dr Vernard Gambles saw CXR. Patient sp02 91 to 95 % room air. He feel patient is ready to go to short stay.

## 2015-08-17 ENCOUNTER — Encounter: Payer: Self-pay | Admitting: Emergency Medicine

## 2015-08-17 ENCOUNTER — Telehealth: Payer: Self-pay | Admitting: *Deleted

## 2015-08-17 ENCOUNTER — Ambulatory Visit (INDEPENDENT_AMBULATORY_CARE_PROVIDER_SITE_OTHER): Payer: Medicare Other | Admitting: Emergency Medicine

## 2015-08-17 VITALS — BP 110/68 | HR 64 | Wt 179.0 lb

## 2015-08-17 DIAGNOSIS — C3491 Malignant neoplasm of unspecified part of right bronchus or lung: Secondary | ICD-10-CM | POA: Diagnosis not present

## 2015-08-17 NOTE — Patient Instructions (Signed)
We will arrange for you to be seen in the multidisciplinary thoracic oncology clinic We will arrange for full pulmonary function testing Follow with Dr Lamonte Sakai in 3 months or sooner if you have any problems.

## 2015-08-17 NOTE — Assessment & Plan Note (Signed)
Newly diagnosed by needle biopsy.He does not have any evidence for mediastinal lymphadenopathy but he does  Have a hypermetabolic iliac node. This would likely need to be biopsied before he could be a surgical candidate. His overall condition and PFT may preclude surgery

## 2015-08-17 NOTE — Telephone Encounter (Signed)
Oncology Nurse Navigator Documentation  Oncology Nurse Navigator Flowsheets 08/17/2015  Referral date to RadOnc/MedOnc 08/17/2015  Navigator Encounter Type Introductory phone call/I received a referral today on Mr. Katt.  I called and left a vm message to call with my name and phone number  Patient Visit Type Initial  Treatment Phase Abnormal Scans  Interventions Coordination of Care  Coordination of Care MD Appointments  Time Spent with Patient 15

## 2015-08-17 NOTE — Progress Notes (Signed)
Subjective:    Patient ID: Mitchell Wood, male    DOB: 11/12/1933, 80 y.o.   MRN: [id number]  HPI 80 yo active smoker ( 50 pack years) with a history of  Alcohol abuse, prostate cancer with history of radiation, coronary artery disease, atrial fibrillation, moderate aortic insufficiency, seen previously by Dr Chase Caller.  He underwent CT scan of the chest 07/05/15 to follow right-sided pulmonary nodule. His revealed interval enlargement. A PET scan was performed on 07/28/15 hat showed a hypermetabolic bilobed right upper lobe lesion measuring 2.0 x 2.6 centimeters. He is referred for further evaluation of this new problem. His wife is present and assists with the history. His FEV1 on 08/09/12 was 1.72 L, 66% predicted. Diffusion capacity 61% predicted.    ROV 08/17/15 -- the patient follows up for further evaluation of his right upper  Mass. He underwent needle biopsy on 08/01/15 that has showed an invasive squamous cell carcinoma. He returns today to discuss the results.  He has seen some scant hemoptysis but is other wise doing well. No CP or SOB.     Review of Systems As per HPI  Past Medical History  Diagnosis Date  . Anxiety   . ED (erectile dysfunction)   . ETOH abuse     liver disease  . Hematospermia   . Prostate cancer ([place])     chemo/radiation 2000  . Ventral hernia   . COPD (chronic obstructive pulmonary disease) ([place])     smoker  . Vitamin D deficiency   . Tobacco abuse   . Coronary artery disease 10/14/12    50-60% LAD  . Moderate aortic insufficiency 10/11/11    NL LVF  . Glaucoma   . PVD (peripheral vascular disease) ([place]) 10/14/12    dilated Ao root at cath  . Atrial fibrillation (HCC)     NUC, 03/31/2009 - No ECG changes, EKG negative for ischemia, clinical correlation recommeneded, mild anteroapical ischemia  . Chronic atrial fibrillation (HCC)     not on Coumadin secondary to GI bleed  . RBBB   . Aortic valve disorder     2D ECHO, 11/18/2012 - EF 50-55%, normal  .  Abnormality of gait 06/10/2015  . Memory change 06/10/2015  . Parkinsonism ([place]) 06/10/2015     Family History  Problem Relation Age of Onset  . Emphysema Brother   . Cancer Brother     prostate  . Allergies      whole family  . Diabetes Sister   . Cancer Sister   . Diabetes Child   . Diabetes Child   . Diabetes Child   . Cancer Brother     prostate  . Cancer Brother     prostate  . Cancer Brother     colon  . Cancer Brother     colon  Brother with lung cancer.   Social History   Social History  . Marital Status: Married    Spouse Name: N/A  . Number of Children: 4  . Years of Education: N/A   Occupational History  . retired    Social History Main Topics  . Smoking status: Current Every Day Smoker -- 1.00 packs/day for 50 years    Types: Cigarettes  . Smokeless tobacco: Never Used     Comment: 30+ yr  . Alcohol Use: No  . Drug Use: No  . Sexual Activity: Not on file   Other Topics Concern  . Not on file   Social History Narrative  Patient drinks about 4-5 cups of caffeine daily.   Patient is right handed.      No Known Allergies   Outpatient Prescriptions Prior to Visit  Medication Sig Dispense Refill  . acetaminophen (TYLENOL) 325 MG tablet Take 2 tablets (650 mg total) by mouth every 4 (four) hours as needed. (Patient taking differently: Take 650 mg by mouth every 4 (four) hours as needed for mild pain or moderate pain. )    . aspirin EC 81 MG tablet Take 81 mg by mouth daily.    . budesonide-formoterol (SYMBICORT) 160-4.5 MCG/ACT inhaler Inhale 2 puffs into the lungs 2 (two) times daily. 1 Inhaler 6  . carbidopa-levodopa (SINEMET IR) 25-100 MG tablet Take 0.5 tablets by mouth 3 (three) times daily. 50 tablet 2  . docusate sodium 100 MG CAPS Take 100 mg by mouth 2 (two) times daily. (Patient taking differently: Take 100 mg by mouth daily. ) 10 capsule 0  . ergocalciferol (VITAMIN D2) 50000 UNITS capsule Take 50,000 Units by mouth 2 (two) times a  week.    . ferrous sulfate 325 (65 FE) MG tablet Take 1 tablet (325 mg total) by mouth daily with breakfast. 30 tablet 0  . isosorbide mononitrate (IMDUR) 30 MG 24 hr tablet Take 0.5 tablets (15 mg total) by mouth daily. 45 tablet 6  . latanoprost (XALATAN) 0.005 % ophthalmic solution Place 1 drop into both eyes at bedtime.  6  . metoprolol tartrate (LOPRESSOR) 25 MG tablet Take 1 tablet (25 mg total) by mouth 2 (two) times daily. 180 tablet 3  . nitroGLYCERIN (NITROSTAT) 0.4 MG SL tablet Place 0.4 mg under the tongue every 5 (five) minutes as needed for chest pain.    . sodium chloride (OCEAN) 0.65 % SOLN nasal spray Place 1 spray into both nostrils 3 (three) times daily as needed (For nosebleeds.).      No facility-administered medications prior to visit.          Objective:   Physical Exam Filed Vitals:   08/17/15 1348 08/17/15 1349  BP:  110/68  Pulse:  64  Weight: 179 lb (81.194 kg)   SpO2:  95%   Gen: Pleasant, well-nourished, in no distress,  normal affect  ENT: No lesions,  mouth clear,  oropharynx clear, no postnasal drip  Neck: No JVD, no TMG, no carotid bruits  Lungs: No use of accessory muscles, no dullness to percussion, clear without rales or rhonchi  Cardiovascular: RRR, heart sounds normal, no murmur or gallops, no peripheral edema  Musculoskeletal: No deformities, no cyanosis or clubbing  Neuro: alert, non focal  Skin: Warm, no lesions or rashes      PET scan 07/28/15 --  COMPARISON: CT chest 07/05/2015 07/22/2014, 11/10/2013 and 09/27/2012.  FINDINGS: NECK  No hypermetabolic lymph nodes in the neck. CT images show no acute findings.  CHEST  No hypermetabolic mediastinal, hilar or axillary lymph nodes. A bilobed lesion in the posterior segment right upper lobe measures approximately 2.0 x 2.6 cm (CT image 23, series 6) with an SUV max of 17.4. A nodule in the medial segment right middle lobe measures roughly 9 mm (image 41), given  motion artifact, with an SUV max of 4.7. CT images show the ascending aorta to measure approximately 4.5 cm. Three-vessel coronary artery calcification. Heart is mildly enlarged. No pericardial or pleural effusion. Emphysematous changes are seen in the upper lobes.  ABDOMEN/PELVIS  No abnormal hypermetabolism in the liver, adrenal glands, spleen or pancreas. A hypermetabolic left external  iliac lymph node measures 12 mm (series 4, image 158) with an SUV max of 10.0. A 7 mm left inguinal lymph node (CT image 167) is borderline hypermetabolic, SUV max 2.9. CT images show the liver to be grossly unremarkable. Granular stones layer in the gallbladder. Adrenal glands are unremarkable. Hyper attenuating lesions in the upper pole right kidney are sub cm in size and too small to characterize. Kidneys, spleen, pancreas, stomach and bowel are otherwise grossly unremarkable.  SKELETON  No abnormal osseous hypermetabolism.  IMPRESSION: 1. Enlarging hypermetabolic right upper lobe nodule is highly worrisome for primary bronchogenic carcinoma. Hypermetabolic right middle lobe nodule may represent a synchronous lesion. 2. Hypermetabolic left external iliac lymph node is clearly abnormal but is considered an unusual site for metastatic disease from primary bronchogenic carcinoma. 3. Ascending aortic aneurysm. Ascending thoracic aortic aneurysm. Recommend semi-annual imaging followup by CTA or MRA and referral to cardiothoracic surgery if not already obtained. This recommendation follows 2010 ACCF/AHA/AATS/ACR/ASA/SCA/SCAI/SIR/STS/SVM Guidelines for the Diagnosis and Management of Patients With Thoracic Aortic Disease. Circulation. 2010; 121: C461-J012. 4. Coronary artery calcification. 5. Cholelithiasis.     Assessment & Plan:  Squamous cell lung cancer (Uncertain) Newly diagnosed by needle biopsy.He does not have any evidence for mediastinal lymphadenopathy but he does  Have a  hypermetabolic iliac node. This would likely need to be biopsied before he could be a surgical candidate. His overall condition and PFT may preclude surgery

## 2015-08-19 ENCOUNTER — Ambulatory Visit (INDEPENDENT_AMBULATORY_CARE_PROVIDER_SITE_OTHER): Payer: Medicare Other | Admitting: Emergency Medicine

## 2015-08-19 DIAGNOSIS — C3491 Malignant neoplasm of unspecified part of right bronchus or lung: Secondary | ICD-10-CM | POA: Diagnosis not present

## 2015-08-19 LAB — PULMONARY FUNCTION TEST
DL/VA % pred: 66 %
DL/VA: 2.99 ml/min/mmHg/L
DLCO UNC % PRED: 71 %
DLCO unc: 22.09 ml/min/mmHg
FEF 25-75 PRE: 0.65 L/s
FEF 25-75 Post: 0.83 L/sec
FEF2575-%CHANGE-POST: 26 %
FEF2575-%PRED-PRE: 32 %
FEF2575-%Pred-Post: 41 %
FEV1-%Change-Post: 11 %
FEV1-%PRED-POST: 57 %
FEV1-%Pred-Pre: 51 %
FEV1-PRE: 1.32 L
FEV1-Post: 1.47 L
FEV1FVC-%CHANGE-POST: 11 %
FEV1FVC-%Pred-Pre: 76 %
FEV6-%CHANGE-POST: 1 %
FEV6-%PRED-POST: 69 %
FEV6-%PRED-PRE: 68 %
FEV6-Post: 2.31 L
FEV6-Pre: 2.27 L
FEV6FVC-%CHANGE-POST: 0 %
FEV6FVC-%Pred-Post: 106 %
FEV6FVC-%Pred-Pre: 105 %
FVC-%CHANGE-POST: 0 %
FVC-%PRED-POST: 66 %
FVC-%Pred-Pre: 65 %
FVC-Post: 2.32 L
FVC-Pre: 2.3 L
POST FEV1/FVC RATIO: 64 %
POST FEV6/FVC RATIO: 100 %
PRE FEV1/FVC RATIO: 57 %
Pre FEV6/FVC Ratio: 99 %

## 2015-08-19 NOTE — Progress Notes (Signed)
PFT done today. 

## 2015-08-25 ENCOUNTER — Other Ambulatory Visit: Payer: Self-pay | Admitting: *Deleted

## 2015-08-25 ENCOUNTER — Telehealth: Payer: Self-pay | Admitting: *Deleted

## 2015-08-25 DIAGNOSIS — C3490 Malignant neoplasm of unspecified part of unspecified bronchus or lung: Secondary | ICD-10-CM

## 2015-08-25 NOTE — Telephone Encounter (Signed)
Called and left a friendly reminder message on pt's voicemail.

## 2015-08-26 ENCOUNTER — Other Ambulatory Visit: Payer: Medicare Other

## 2015-08-26 ENCOUNTER — Ambulatory Visit: Payer: Medicare Other | Attending: Thoracic Surgery (Cardiothoracic Vascular Surgery) | Admitting: Physical Therapy

## 2015-08-26 ENCOUNTER — Inpatient Hospital Stay: Admission: RE | Admit: 2015-08-26 | Payer: Medicare Other | Source: Ambulatory Visit | Admitting: Radiation Oncology

## 2015-08-26 ENCOUNTER — Telehealth: Payer: Self-pay | Admitting: *Deleted

## 2015-08-26 ENCOUNTER — Encounter: Payer: Self-pay | Admitting: *Deleted

## 2015-08-26 ENCOUNTER — Ambulatory Visit: Payer: Medicare Other | Admitting: Radiation Oncology

## 2015-08-26 ENCOUNTER — Ambulatory Visit: Payer: Medicare Other | Admitting: Internal Medicine

## 2015-08-26 ENCOUNTER — Institutional Professional Consult (permissible substitution) (INDEPENDENT_AMBULATORY_CARE_PROVIDER_SITE_OTHER): Payer: Medicare Other | Admitting: Thoracic Surgery (Cardiothoracic Vascular Surgery)

## 2015-08-26 VITALS — BP 114/52 | HR 80 | Temp 97.8°F | Resp 18 | Wt 179.6 lb

## 2015-08-26 DIAGNOSIS — R269 Unspecified abnormalities of gait and mobility: Secondary | ICD-10-CM | POA: Diagnosis present

## 2015-08-26 DIAGNOSIS — R5381 Other malaise: Secondary | ICD-10-CM

## 2015-08-26 DIAGNOSIS — C3491 Malignant neoplasm of unspecified part of right bronchus or lung: Secondary | ICD-10-CM | POA: Diagnosis not present

## 2015-08-26 DIAGNOSIS — R29818 Other symptoms and signs involving the nervous system: Secondary | ICD-10-CM | POA: Diagnosis present

## 2015-08-26 DIAGNOSIS — R2689 Other abnormalities of gait and mobility: Secondary | ICD-10-CM

## 2015-08-26 NOTE — Therapy (Signed)
[place], [place], [address] Phone: [phone]   Fax:  [phone]  Physical Therapy Evaluation  Patient Details  Name: Mitchell Wood MRN: [id number] Date of Birth: 11/12/1933 Referring Provider: Dr. Modesto Charon  Encounter Date: 08/26/2015      PT End of Session - 08/26/15 1747    Visit Number 1   Number of Visits 19   Date for PT Re-Evaluation 10/13/15   PT Start Time 1625   PT Stop Time 1700   PT Time Calculation (min) 35 min   Activity Tolerance Patient tolerated treatment well   Behavior During Therapy [facility] for tasks assessed/performed      Past Medical History  Diagnosis Date  . Anxiety   . ED (erectile dysfunction)   . ETOH abuse     liver disease  . Hematospermia   . Prostate cancer ([place])     chemo/radiation 2000  . Ventral hernia   . COPD (chronic obstructive pulmonary disease) ([place])     smoker  . Vitamin D deficiency   . Tobacco abuse   . Coronary artery disease 10/14/12    50-60% LAD  . Moderate aortic insufficiency 10/11/11    NL LVF  . Glaucoma   . PVD (peripheral vascular disease) ([place]) 10/14/12    dilated Ao root at cath  . Atrial fibrillation (HCC)     NUC, 03/31/2009 - No ECG changes, EKG negative for ischemia, clinical correlation recommeneded, mild anteroapical ischemia  . Chronic atrial fibrillation (HCC)     not on Coumadin secondary to GI bleed  . RBBB   . Aortic valve disorder     2D ECHO, 11/18/2012 - EF 50-55%, normal  . Abnormality of gait 06/10/2015  . Memory change 06/10/2015  . Parkinsonism ([place]) 06/10/2015    Past Surgical History  Procedure Laterality Date  . Ventral hernia repair    . Cardiac catheterization  10/14/2012    Tubular moderate lesion in the mid LAD, proceed with FFR interrogation to determine physiologic significance  . Left heart catheterization with coronary angiogram N/A 10/14/2012    Procedure: LEFT HEART CATHETERIZATION WITH  CORONARY ANGIOGRAM;  Surgeon: Lorretta Harp, MD;  Location: [facility] CATH LAB;  Service: Cardiovascular;  Laterality: N/A;    There were no vitals filed for this visit.  Visit Diagnosis:  Physical deconditioning - Plan: PT plan of care cert/re-cert  Gait abnormality - Plan: PT plan of care cert/re-cert  Balance problems - Plan: PT plan of care cert/re-cert      Subjective Assessment - 08/26/15 1731    Subjective wants to "get rid of the cancer"   Patient is accompained by: Family member  wife   Pertinent History Patient had been followed with scans due to h/o right pulmonary nodule. 07/05/15 CT chest, 07/28/15 PET, 08/11/15 CT biopsy led to diagnosis of right upper lobe invasive squamous cell carcinoma and right middlelobe 9 mm nodule.  h/o prostate cancer s/p XRT, and now with pelvic nodes that look positive on scans.  h/o TIA diagnosed 05/19/15 but with first signs in June 2016 (increasing stuttering and drooling); now with at least expressive aphasia.  Wife reports some difficulty following instructions.  Current smoker (50 pack-years).  h/o ETOH abuse.  Parkinsonism and on medication, but has not had physical or occupational therapy for that.  Gait abnormality.  Atrial fib.  COPD   Patient Stated Goals get rid of the cancer   Currently in Pain? No/denies  [facility] PT Assessment - 08/26/15 0001    Assessment   Medical Diagnosis right upper lobe squamous cell carcinoma   Referring Provider Dr. Modesto Charon   Precautions   Precautions Fall;Other (comment)  aphasia--at least expressive; cancer precautions   Restrictions   Weight Bearing Restrictions No   Balance Screen   Has the patient fallen in the past 6 months Yes  needed help to get up   How many times? 1   Has the patient had a decrease in activity level because of a fear of falling?  No   Is the patient reluctant to leave their home because of a fear of falling?  No   Home Film/video editor residence   Living Arrangements Spouse/significant other   Type of [place]  senior apt.; no stairs   Prior Function   Level of Independence Requires assistive device for independence   Vocation Retired   Leisure goes to [employer] 3x/week and says he does weights, sit-ups, and cardio for 1 hour; goes to [employer]   Overall Cognitive Status Difficult to assess  wife reports difficulty understanding/following instructions   Functional Tests   Functional tests Sit to Stand   Sit to Stand   Comments 4x in 30 seconds (below average for age)   Posture/Postural Control   Posture/Postural Control Postural limitations   Postural Limitations Flexed trunk  slight   ROM / Strength   AROM / PROM / Strength AROM   AROM   Overall AROM Comments standing trunk AROM grossly 25% limited in all directions; appears to have a left shoulder AROM limitation (though this was not formally checked)   Ambulation/Gait   Ambulation/Gait Yes   Ambulation/Gait Assistance 6: Modified independent (Device/Increase time)   Assistive device Straight cane  but says he forgets it at times   Gait Comments unsteady, especially on turns; arrhythmical; cane right hand; Parkinson's features (slight shuffling); loses balance on turns with or without cane   Balance   Balance Assessed Yes   Dynamic Standing Balance   Dynamic Standing - Comments reaches forward 6 inches with difficulty and slowly  below average for age                           PT Education - 08/26/15 1747    Education provided Yes   Education Details posture, breathing, walking, PT info, staying active   Person(s) Educated Patient;Spouse   Methods Explanation;Handout   Comprehension Verbalized understanding               Lung Clinic Goals - 08/26/15 1756    Patient will be able to verbalize understanding of the benefit of exercise to decrease fatigue.   Status Achieved   Patient will be  able to verbalize the importance of posture.   Status Achieved   Patient will be able to demonstrate diaphragmatic breathing for improved lung function.   Status Achieved   Patient will be able to verbalize understanding of the role of physical therapy to prevent functional decline and who to contact if physical therapy is needed.   Status Achieved             Plan - 08/26/15 1748    Clinical Impression Statement Pleasant gentleman with a complicated medical history who is now diagnosed with lung cancer; he is motivated to do therapy because without it his pulmonary function tests are inadequate for  him to undergo surgery, and he wants to have the cancer excised.  He has aphasia, at least expressive, and he has a risk of falling due to unsteadiness on his feet.     Pt will benefit from skilled therapeutic intervention in order to improve on the following deficits Cardiopulmonary status limiting activity;Decreased balance;Abnormal gait   Rehab Potential Good   PT Frequency 3x / week   PT Duration 6 weeks   PT Treatment/Interventions Therapeutic exercise;Gait training;Balance training;Stair training;Patient/family education   PT Next Visit Plan Do 6 minute walk test, TUG,check ability to do stairs.  Beginfocused endurance training with goal of improving patient's pulmonary function to make him a surgical candidate for excision of tumor.  Include balance re-ed for safety.  Review and revise patients' gym workout program with him for optimal benefit.  Add appropriate goals.   Consulted and Agree with Plan of Care Patient;Family member/caregiver          G-Codes - 08-29-15 1757    Functional Assessment Tool Used clinical judgement   Functional Limitation Mobility: Walking and moving around   Mobility: Walking and Moving Around Current Status [phone]) At least 40 percent but less than 60 percent impaired, limited or restricted   Mobility: Walking and Moving Around Goal Status [phone]) At  least 20 percent but less than 40 percent impaired, limited or restricted       Problem List Patient Active Problem List   Diagnosis Date Noted  . Squamous cell lung cancer ([place]) 08/03/2015  . Abnormality of gait 06/10/2015  . Memory change 06/10/2015  . Parkinsonism ([place]) 06/10/2015  . Anemia due to acute blood loss 07/10/2014  . Frequent epistaxis 07/10/2014  . Thoracic aortic aneurysm ([place]) 05/08/2013  . Hyperlipidemia 05/08/2013  . Angina effort, cath 10/14/12 10/15/2012  . CAD (coronary artery disease) 50-60% non critical LAD 10/14/12 10/15/2012  . Abnormal nuclear cardiac imaging test 10/15/2012  . RBBB 10/15/2012  . Aortic insufficiency, moderate 10/15/2012  . Chronic atrial fibrillation, not on Coumadin secondary to history of GI bleeding 10/15/2012  . Prostate cancer history 10/15/2012  . ILD (interstitial lung disease) ([place]) 09/25/2012  . Epistaxis, recurrent 09/25/2012  . Smoker 07/10/2012  . COPD (chronic obstructive pulmonary disease) ([place]) 07/10/2012  . Cancer screening 07/10/2012    SALISBURY,DONNA August 29, 2015, 6:00 PM  [place], [place], [address] Phone: [phone]   Fax:  [phone]  Name: Zylan Almquist MRN: [id number] Date of Birth: 11/12/1933   Serafina Royals, PT 08/29/2015 6:00 PM

## 2015-08-26 NOTE — Telephone Encounter (Signed)
Oncology Nurse Navigator Documentation  Oncology Nurse Navigator Flowsheets 08/26/2015  Patient Visit Type Follow-up/per cancer conference, patient only needs to see Dr. Roxan Hockey.  I called to update patient   Treatment Phase Abnormal Scans  Interventions Coordination of Care  Coordination of Care MD Appointments  Time Spent with Patient 15

## 2015-08-26 NOTE — Progress Notes (Signed)
Oncology Nurse Navigator Documentation  Oncology Nurse Navigator Flowsheets 08/26/2015  Navigator Encounter Type Clinic/MDC/spoke with patient and wife today.  Patient has dx of early lung cancer.  Dr. Roxan Hockey request pre-hab therapy.  PT, Nurse Navigator and CSW spoke with patient today.  I spoke with patient regarding smoking cessation.  We agreed on quit date of 09/09/15.  I also gave him resources and information for help.  I notified nutrition for a consult.    Patient Visit Type Initial  Treatment Phase Abnormal Scans  Interventions Coordination of Care;Education Method  Coordination of Care Other  Education Method Verbal;Written  Time Spent with Patient 30

## 2015-08-27 ENCOUNTER — Encounter: Payer: Self-pay | Admitting: *Deleted

## 2015-08-27 NOTE — Progress Notes (Signed)
Oncology Nurse Navigator Documentation  Oncology Nurse Navigator Flowsheets 08/27/2015  Navigator Encounter Type Other/Dr. Roxan Hockey requested last notes from Dr. Cy Blamer office.  I had patient to complete release of information form.  I called Dr. Cy Blamer office to notify them.  I faxed form to them with confirmation back.  I also called [facility] to update them.   Interventions Coordination of Care  Time Spent with Patient 30

## 2015-08-27 NOTE — Progress Notes (Signed)
PCP is Maximino Greenland, MD Referring Provider is Glendale Chard, MD  No chief complaint on file.   HPI: 80 yo man recently diagnosed with squamous cell carcinoma of the right upper lobe presents for surgical consideration.  Mitchell Wood is a 80 yo man with a past history significant for tobacco abuse (50 py), COPD, chronic atrial fibrillation, moderate CAD, moderate aortic insuffiency, prostate cancer, ethanol abuse, glaucoma, PAD, and recently diagnosed Parkinsonism. He had a CT angio in March 2016 for evaluation of a thoracic aneurysm. There was a RUL nodule which had grown since his prior CT. This was followed and increased in size.  A CT guided biopsy showed squamous cell carcinoma.  He had a PET/CT which showed the right upper lobe nodule was hypermetabolic. There were also hypermetabolic inguinal and pelvic lymph nodes. He has known prostate cancer treated with radiation many years ago. He currently receives Lupron injections. His wife says his most recent PSA was 6 down from > 60.  He has hesitant speech. He walks with his wife's cane. He gets SOB with exertion but denies chest pain. He does complain of cough and wheezing at times. Per his wife he has had a general decline and significant memory problems. She thinks he had a stroke but there is no indication of that from Dr. Jannifer Franklin' note.    Zubrod Score: At the time of surgery this patient's most appropriate activity status/level should be described as: '[]'$     0    Normal activity, no symptoms '[]'$     1    Restricted in physical strenuous activity but ambulatory, able to do out light work '[x]'$     2    Ambulatory and capable of self care, unable to do work activities, up and about >50 % of waking hours                              '[]'$     3    Only limited self care, in bed greater than 50% of waking hours '[]'$     4    Completely disabled, no self care, confined to bed or chair '[]'$     5    Moribund    Past Medical History  Diagnosis Date  .  Anxiety   . ED (erectile dysfunction)   . ETOH abuse     liver disease  . Hematospermia   . Prostate cancer ([place])     chemo/radiation 2000  . Ventral hernia   . COPD (chronic obstructive pulmonary disease) ([place])     smoker  . Vitamin D deficiency   . Tobacco abuse   . Coronary artery disease 10/14/12    50-60% LAD  . Moderate aortic insufficiency 10/11/11    NL LVF  . Glaucoma   . PVD (peripheral vascular disease) ([place]) 10/14/12    dilated Ao root at cath  . Atrial fibrillation (HCC)     NUC, 03/31/2009 - No ECG changes, EKG negative for ischemia, clinical correlation recommeneded, mild anteroapical ischemia  . Chronic atrial fibrillation (HCC)     not on Coumadin secondary to GI bleed  . RBBB   . Aortic valve disorder     2D ECHO, 11/18/2012 - EF 50-55%, normal  . Abnormality of gait 06/10/2015  . Memory change 06/10/2015  . Parkinsonism ([place]) 06/10/2015    Past Surgical History  Procedure Laterality Date  . Ventral hernia repair    . Cardiac catheterization  10/14/2012    Tubular moderate lesion in the mid LAD, proceed with FFR interrogation to determine physiologic significance  . Left heart catheterization with coronary angiogram N/A 10/14/2012    Procedure: LEFT HEART CATHETERIZATION WITH CORONARY ANGIOGRAM;  Surgeon: Lorretta Harp, MD;  Location: [facility] CATH LAB;  Service: Cardiovascular;  Laterality: N/A;    Family History  Problem Relation Age of Onset  . Emphysema Brother   . Cancer Brother     prostate  . Allergies      whole family  . Diabetes Sister   . Cancer Sister   . Diabetes Child   . Diabetes Child   . Diabetes Child   . Cancer Brother     prostate  . Cancer Brother     prostate  . Cancer Brother     colon  . Cancer Brother     colon    Social History Social History  Substance Use Topics  . Smoking status: Current Every Day Smoker -- 1.00 packs/day for 50 years    Types: Cigarettes  . Smokeless tobacco: Never Used     Comment: 30+ yr  .  Alcohol Use: No    Current Outpatient Prescriptions  Medication Sig Dispense Refill  . acetaminophen (TYLENOL) 325 MG tablet Take 2 tablets (650 mg total) by mouth every 4 (four) hours as needed. (Patient taking differently: Take 650 mg by mouth every 4 (four) hours as needed for mild pain or moderate pain. )    . aspirin EC 81 MG tablet Take 81 mg by mouth daily.    . budesonide-formoterol (SYMBICORT) 160-4.5 MCG/ACT inhaler Inhale 2 puffs into the lungs 2 (two) times daily. 1 Inhaler 6  . carbidopa-levodopa (SINEMET IR) 25-100 MG tablet Take 0.5 tablets by mouth 3 (three) times daily. 50 tablet 2  . docusate sodium 100 MG CAPS Take 100 mg by mouth 2 (two) times daily. (Patient taking differently: Take 100 mg by mouth daily. ) 10 capsule 0  . ergocalciferol (VITAMIN D2) 50000 UNITS capsule Take 50,000 Units by mouth 2 (two) times a week.    . ferrous sulfate 325 (65 FE) MG tablet Take 1 tablet (325 mg total) by mouth daily with breakfast. 30 tablet 0  . isosorbide mononitrate (IMDUR) 30 MG 24 hr tablet Take 0.5 tablets (15 mg total) by mouth daily. 45 tablet 6  . latanoprost (XALATAN) 0.005 % ophthalmic solution Place 1 drop into both eyes at bedtime.  6  . metoprolol tartrate (LOPRESSOR) 25 MG tablet Take 1 tablet (25 mg total) by mouth 2 (two) times daily. 180 tablet 3  . nitroGLYCERIN (NITROSTAT) 0.4 MG SL tablet Place 0.4 mg under the tongue every 5 (five) minutes as needed for chest pain.    . sodium chloride (OCEAN) 0.65 % SOLN nasal spray Place 1 spray into both nostrils 3 (three) times daily as needed (For nosebleeds.).      No current facility-administered medications for this visit.    No Known Allergies  Review of Systems  Constitutional: Positive for activity change and fatigue.  HENT: Positive for drooling, nosebleeds and trouble swallowing.   Eyes: Negative for visual disturbance.  Respiratory: Positive for cough and wheezing.   Cardiovascular: Positive for leg swelling.  Negative for chest pain.  Gastrointestinal: Positive for blood in stool (in past, none recently). Negative for abdominal pain.  Genitourinary: Positive for frequency and difficulty urinating.  Musculoskeletal: Positive for myalgias, joint swelling, arthralgias and gait problem (uses his wife's cane).  Neurological:  Positive for tremors (mild) and speech difficulty. Negative for weakness.       Memory loss.  Hematological: Negative for adenopathy. Does not bruise/bleed easily.  All other systems reviewed and are negative.   BP 114/52 mmHg  Pulse 80  Temp(Src) 97.8 F (36.6 C)  Resp 18  Wt 179 lb 9.6 oz (81.466 kg)  SpO2 95% Physical Exam  Constitutional: He is oriented to person, place, and time. He appears well-developed and well-nourished. No distress.  HENT:  Head: Normocephalic and atraumatic.  Mouth/Throat: No oropharyngeal exudate.  Eyes: Conjunctivae and EOM are normal. No scleral icterus.  Neck: Neck supple. No thyromegaly present.  Cardiovascular: Normal heart sounds.   No murmur heard. Irregularly irregular  Pulmonary/Chest: Effort normal and breath sounds normal. He has no wheezes. He has no rales.  Abdominal: Soft. Bowel sounds are normal. He exhibits no distension. There is no tenderness.  Musculoskeletal: He exhibits edema (1+).  Lymphadenopathy:    He has no cervical adenopathy.  Neurological: He is alert and oriented to person, place, and time. A cranial nerve deficit (dysarthric speech) is present. He exhibits normal muscle tone.  Motor grossly 5/5 and symmetrical  Skin: Skin is warm and dry.  Vitals reviewed.    Diagnostic Tests: CT ANGIOGRAPHY CHEST WITH CONTRAST  TECHNIQUE: Multidetector CT imaging of the chest was performed using the standard protocol during bolus administration of intravenous contrast. Multiplanar CT image reconstructions and MIPs were obtained to evaluate the vascular anatomy.  CONTRAST: 144m OMNIPAQUE IOHEXOL 350 MG/ML  SOLN  COMPARISON: Multiple priors, most recently 07/22/2014  FINDINGS: Mediastinum/Nodes: Heart is top-normal in size. No pericardial effusion.  Coronary atherosclerosis.  Atherosclerotic calcifications of the aortic arch.  Ectasia of the ascending thoracic aorta, measuring 4.1 cm (series 4/ image 49), unchanged. Proximal descending thoracic aorta measures 3.0 cm (series 4/ image 27). Descending aorta measures 2.7 cm at the aortic hiatus (series 4/image 90).  12 mm short axis right hilar node (series 4/ image 44). Additional small mediastinal nodes measuring up to 6 mm short axis (series 4/image 36).  Visualized thyroid is unremarkable.  Lungs/Pleura: 3.5 x 1.9 x 2.0 cm irregular nodule in the posterior right upper lobe (series 5/image 30), previously 2.6 x 1.0 x 1.1 cm when remeasured in a similar fashion, highly suspicious for primary bronchogenic neoplasm.  Underlying moderate centrilobular and paraseptal emphysematous changes.  No focal consolidation.  No pleural effusion or pneumothorax.  Upper abdomen: Visualized upper abdomen is notable for layering gallstones (series 4/ image 91).  Musculoskeletal: Degenerative changes of the visualized thoracolumbar spine.  Review of the MIP images confirms the above findings.  IMPRESSION: 3.5 cm irregular nodule in the posterior right upper lobe, previously 2.6 cm when remeasured in a similar fashion, highly suspicious for primary bronchogenic neoplasm.  Associated 12 mm short axis right hilar node, worrisome for nodal metastasis.  Stable ectasia of the ascending thoracic aorta, measuring 4.1 cm.  Thoracic surgery consultation is suggested. Consider percutaneous biopsy, bronchoscopy, and/or PET-CT as clinically warranted.   Electronically Signed  By: SJulian HyM.D.  On: 07/05/2015 12:32  NUCLEAR MEDICINE PET SKULL BASE TO THIGH  TECHNIQUE: 10.5 mCi F-18 FDG was injected  intravenously. Full-ring PET imaging was performed from the skull base to thigh after the radiotracer. CT data was obtained and used for attenuation correction and anatomic localization.  FASTING BLOOD GLUCOSE: Value: 87 mg/dl  COMPARISON: CT chest 07/05/2015 07/22/2014, 11/10/2013 and 09/27/2012.  FINDINGS: NECK  No hypermetabolic lymph nodes in the neck.  CT images show no acute findings.  CHEST  No hypermetabolic mediastinal, hilar or axillary lymph nodes. A bilobed lesion in the posterior segment right upper lobe measures approximately 2.0 x 2.6 cm (CT image 23, series 6) with an SUV max of 17.4. A nodule in the medial segment right middle lobe measures roughly 9 mm (image 41), given motion artifact, with an SUV max of 4.7. CT images show the ascending aorta to measure approximately 4.5 cm. Three-vessel coronary artery calcification. Heart is mildly enlarged. No pericardial or pleural effusion. Emphysematous changes are seen in the upper lobes.  ABDOMEN/PELVIS  No abnormal hypermetabolism in the liver, adrenal glands, spleen or pancreas. A hypermetabolic left external iliac lymph node measures 12 mm (series 4, image 158) with an SUV max of 10.0. A 7 mm left inguinal lymph node (CT image 167) is borderline hypermetabolic, SUV max 2.9. CT images show the liver to be grossly unremarkable. Granular stones layer in the gallbladder. Adrenal glands are unremarkable. Hyper attenuating lesions in the upper pole right kidney are sub cm in size and too small to characterize. Kidneys, spleen, pancreas, stomach and bowel are otherwise grossly unremarkable.  SKELETON  No abnormal osseous hypermetabolism.  IMPRESSION: 1. Enlarging hypermetabolic right upper lobe nodule is highly worrisome for primary bronchogenic carcinoma. Hypermetabolic right middle lobe nodule may represent a synchronous lesion. 2. Hypermetabolic left external iliac lymph node is clearly  abnormal but is considered an unusual site for metastatic disease from primary bronchogenic carcinoma. 3. Ascending aortic aneurysm. Ascending thoracic aortic aneurysm. Recommend semi-annual imaging followup by CTA or MRA and referral to cardiothoracic surgery if not already obtained. This recommendation follows 2010 ACCF/AHA/AATS/ACR/ASA/SCA/SCAI/SIR/STS/SVM Guidelines for the Diagnosis and Management of Patients With Thoracic Aortic Disease. Circulation. 2010; 121: P824-M353. 4. Coronary artery calcification. 5. Cholelithiasis.   Electronically Signed  By: Lorin Picket M.D.  On: 07/28/2015 15:48  PULMONARY FUNCTION TESTING  FVC= 2.30(65%) FEV1= 1.32(51%) FEV1/FVC = 57% DLCO= 22.09(71%)  I personally reviewed the CT and PET/CT and concur with the findings as noted above.  Impression: 80 yo man with a complicated medical history who has a squamous cell carcinoma of the right upper lobe. There is a small RML nodule that is concerning for a synchronous cancer. He also has pelvic and inguinal adenopathy tha are almost certainly related to his prostate cancer.  He has had a general deterioration over the past 6-9 months. Despite that he and his wife state that he goes to the gym every day and remains relatively active. I think we need to get a better idea of his physical capacity so I am referring him for "prehab" PT to optimize his physical condition. He also will receive smoking cessations counseling.  He has know CAD managed medically, moderate AI and chronic atrial fib. He needs preoperative Cardiology consultation for clearance for surgery. He has not seen Dr. [name] since October 2015.  He has prostate cancer and is on Lupron. I suspect the pelvic adenopathy is related to that.  I did discuss potential treatment options with Mr and Mrs Wood. We discussed surgery v radiation +/- chemotherapy. They understand the relative advantages and disadvantages of each approach. We did  discuss the general nature of surgery and the risks such as death, stroke, MI, DVT, PE, bleeding, infections, and other potentially serious complications.  I am not ready at this point to offer surgery but will further evaluate and see him back in 3 weeks.  Plan:  "Prehab" with smoking cessation, PT Cardiology evaluation  re: clearance for surgery Review Urology records from Dr. Risa Grill when available Follow up in 3 weeks to check on progress   Melrose Nakayama, MD [facility] [phone]

## 2015-09-01 ENCOUNTER — Encounter: Payer: Self-pay | Admitting: Physician Assistant

## 2015-09-01 ENCOUNTER — Encounter: Payer: Self-pay | Admitting: *Deleted

## 2015-09-01 ENCOUNTER — Ambulatory Visit: Payer: Medicare Other | Attending: Thoracic Surgery (Cardiothoracic Vascular Surgery)

## 2015-09-01 ENCOUNTER — Ambulatory Visit (INDEPENDENT_AMBULATORY_CARE_PROVIDER_SITE_OTHER): Payer: Medicare Other | Admitting: Physician Assistant

## 2015-09-01 VITALS — BP 128/72 | HR 85 | Ht 69.0 in | Wt 181.0 lb

## 2015-09-01 DIAGNOSIS — R29818 Other symptoms and signs involving the nervous system: Secondary | ICD-10-CM | POA: Diagnosis present

## 2015-09-01 DIAGNOSIS — I482 Chronic atrial fibrillation, unspecified: Secondary | ICD-10-CM

## 2015-09-01 DIAGNOSIS — C3491 Malignant neoplasm of unspecified part of right bronchus or lung: Secondary | ICD-10-CM

## 2015-09-01 DIAGNOSIS — R269 Unspecified abnormalities of gait and mobility: Secondary | ICD-10-CM

## 2015-09-01 DIAGNOSIS — R5381 Other malaise: Secondary | ICD-10-CM | POA: Insufficient documentation

## 2015-09-01 DIAGNOSIS — Z01818 Encounter for other preprocedural examination: Secondary | ICD-10-CM | POA: Diagnosis not present

## 2015-09-01 DIAGNOSIS — R2689 Other abnormalities of gait and mobility: Secondary | ICD-10-CM

## 2015-09-01 NOTE — Progress Notes (Signed)
Cardiology Office Note   Date:  09/01/2015   ID:  Mitchell Wood, DOB 11/12/1933, MRN [id number]  PCP:  Maximino Greenland, MD  Cardiologist:  Dr Stacy Gardner, PA-C   Chief Complaint  Patient presents with  . Appointment    always tired. swelling in feet and ankles    History of Present Illness: Mitchell Wood is a 81 y.o. male with a history of RBBB, non-obs CAD, AI, chronic afib (no anticoag 2nd GIB), COPD, ILD, Parkinson's dz, Lung CA, prostate CA  Mitchell Wood presents for preoperative evaluation for lung cancer. He was diagnosed with squamous cell carcinoma in the right upper lobe and seen by Dr. Roxan Hockey. He has hypermetabolic inguinal and pelvic lymph nodes in addition to the hypermetabolic right upper lobe nodule.  Mitchell Wood struggles with many things. He has significant dyspnea on exertion and can walk at most half a block without stopping. At that point, he'll be extremely short of breath. He walks with a cane because of balance issues. Because of issues related to the stroke and possibly his history of prostate cancer, he has problems with incontinence. He also has problems with drooling and speech. His tremor has improved significantly on a low dose of Sinemet.  Mitchell Wood speaks slowly but says that he has not had any chest pain. He's had no recent changes in his dyspnea on exertion. He has occasional lower extremity edema during the day but does not wake up with it in the morning. He denies orthopnea or PND. His wife says he sleeps pretty well. He is compliant with his medications.   Past Medical History  Diagnosis Date  . Anxiety   . ED (erectile dysfunction)   . ETOH abuse     liver disease  . Hematospermia   . Prostate cancer ([place])     chemo/radiation 2000  . Ventral hernia   . COPD (chronic obstructive pulmonary disease) ([place])     smoker  . Vitamin D deficiency   . Tobacco abuse   . Coronary artery disease 10/14/12    50-60% LAD  . Moderate aortic  insufficiency 10/11/11    NL LVF  . Glaucoma   . PVD (peripheral vascular disease) ([place]) 10/14/12    dilated Ao root at cath  . Atrial fibrillation (HCC)     NUC, 03/31/2009 - No ECG changes, EKG negative for ischemia, clinical correlation recommeneded, mild anteroapical ischemia  . Chronic atrial fibrillation (HCC)     not on Coumadin secondary to GI bleed  . RBBB   . Aortic valve disorder     2D ECHO, 11/18/2012 - EF 50-55%, normal  . Abnormality of gait 06/10/2015  . Memory change 06/10/2015  . Parkinsonism ([place]) 06/10/2015    Past Surgical History  Procedure Laterality Date  . Ventral hernia repair    . Cardiac catheterization  10/14/2012    Tubular moderate lesion in the mid LAD, proceed with FFR interrogation to determine physiologic significance  . Left heart catheterization with coronary angiogram N/A 10/14/2012    Procedure: LEFT HEART CATHETERIZATION WITH CORONARY ANGIOGRAM;  Surgeon: Lorretta Harp, MD;  Location: [facility] CATH LAB;  Service: Cardiovascular;  Laterality: N/A;    Current Outpatient Prescriptions  Medication Sig Dispense Refill  . acetaminophen (TYLENOL) 325 MG tablet Take 2 tablets (650 mg total) by mouth every 4 (four) hours as needed. (Patient taking differently: Take 650 mg by mouth every 4 (four) hours as needed for mild pain or moderate pain. )    .  aspirin EC 81 MG tablet Take 81 mg by mouth daily.    . budesonide-formoterol (SYMBICORT) 160-4.5 MCG/ACT inhaler Inhale 2 puffs into the lungs 2 (two) times daily. 1 Inhaler 6  . carbidopa-levodopa (SINEMET IR) 25-100 MG tablet Take 0.5 tablets by mouth 3 (three) times daily. 50 tablet 2  . docusate sodium 100 MG CAPS Take 100 mg by mouth 2 (two) times daily. (Patient taking differently: Take 100 mg by mouth daily. ) 10 capsule 0  . ergocalciferol (VITAMIN D2) 50000 UNITS capsule Take 50,000 Units by mouth 2 (two) times a week.    . ferrous sulfate 325 (65 FE) MG tablet Take 1 tablet (325 mg total) by mouth daily  with breakfast. 30 tablet 0  . isosorbide mononitrate (IMDUR) 30 MG 24 hr tablet Take 0.5 tablets (15 mg total) by mouth daily. 45 tablet 6  . latanoprost (XALATAN) 0.005 % ophthalmic solution Place 1 drop into both eyes at bedtime.  6  . metoprolol tartrate (LOPRESSOR) 25 MG tablet Take 1 tablet (25 mg total) by mouth 2 (two) times daily. 180 tablet 3  . nitroGLYCERIN (NITROSTAT) 0.4 MG SL tablet Place 0.4 mg under the tongue every 5 (five) minutes as needed for chest pain.    . sodium chloride (OCEAN) 0.65 % SOLN nasal spray Place 1 spray into both nostrils 3 (three) times daily as needed (For nosebleeds.).      No current facility-administered medications for this visit.    Allergies:   Review of patient's allergies indicates no known allergies.    Social History:  The patient  reports that he has been smoking Cigarettes.  He has a 50 pack-year smoking history. He has never used smokeless tobacco. He reports that he does not drink alcohol or use illicit drugs.   Family History:  The patient's family history includes Cancer in his brother, brother, brother, brother, brother, and sister; Diabetes in his child, child, child, and sister; Emphysema in his brother.    ROS:  Please see the history of present illness. All other systems are reviewed and negative.    PHYSICAL EXAM: VS:  BP 128/72 mmHg  Pulse 85  Ht '5\' 9"'$  (1.753 m)  Wt 181 lb (82.101 kg)  BMI 26.72 kg/m2 , BMI Body mass index is 26.72 kg/(m^2). GEN: Well nourished, well developed, male in no acute distress HEENT: normal for age  Neck: no JVD, no carotid bruit, no masses Cardiac: Irreg irreg; no murmur, no rubs, or gallops Respiratory:  clear to auscultation bilaterally, normal work of breathing GI: soft, nontender, nondistended, + BS MS: no deformity or atrophy; no edema; distal pulses are 2+ in all 4 extremities  Skin: warm and dry, no rash Neuro:  Strength and sensation are intact, he is alert and oriented 2, speech is  slow and he has trouble with word finding   EKG:  EKG is ordered today. The ekg ordered today demonstrates right bundle branch block and left anterior fascicular block. He is in atrial fibrillation with a rate of 82. No new changes   Recent Labs: 12/14/2014: ALT 14 05/19/2015: B Natriuretic Peptide 208.0* 07/02/2015: BUN 14; Creatinine, Ser 0.95; Potassium 4.8; Sodium 140 08/11/2015: Hemoglobin 13.2; Platelets 196    Lipid Panel No results found for: CHOL, TRIG, HDL, CHOLHDL, VLDL, LDLCALC, LDLDIRECT   Wt Readings from Last 3 Encounters:  09/01/15 181 lb (82.101 kg)  08/26/15 179 lb 9.6 oz (81.466 kg)  08/17/15 179 lb (81.194 kg)     Other studies  Reviewed: Additional studies/ records that were reviewed today include: Office records and consults from neurology, TCT S and other records.  ASSESSMENT AND PLAN:  1.  Preoperative surgical evaluation: Mr. Bir has no ongoing cardiac issues. His atrial fibrillation rate is controlled. He does not have signs or symptoms of volume overload by exam. His weight is stable.  However, he has multiple medical problems. Per the ACS NSQIP surgical risk calculator, his risk of mortality with lung surgery is 6.6%. A letter was given to his wife say that he is at increased surgical risk but it is not prohibited.  2. Atrial fibrillation: His rate is controlled, no med changes, he is not anticoagulated because of a history of GI bleed. He is on baby aspirin and tolerating this well.  Current medicines are reviewed at length with the patient today.  The patient does not have concerns regarding medicines.  The following changes have been made:  no change  Labs/ tests ordered today include: ECG     Disposition:   FU with Dr. [name]  Signed, Lenoard Aden  09/01/2015 2:03 PM    [address], [place], [place]  [address] Phone: [phone]; Fax: [phone]

## 2015-09-01 NOTE — Therapy (Addendum)
[place], [place], [address] Phone: [phone]   Fax:  [phone]  Physical Therapy Treatment  Patient Details  Name: Mitchell Wood MRN: [id number] Date of Birth: 11/12/1933 Referring Provider: Dr. Modesto Charon  Encounter Date: 09/01/2015      PT End of Session - 09/01/15 1042    Visit Number 2   Number of Visits 19   Date for PT Re-Evaluation 10/13/15   PT Start Time 0937   PT Stop Time 1024   PT Time Calculation (min) 47 min   Activity Tolerance Patient tolerated treatment well   Behavior During Therapy [facility] for tasks assessed/performed      Past Medical History  Diagnosis Date  . Anxiety   . ED (erectile dysfunction)   . ETOH abuse     liver disease  . Hematospermia   . Prostate cancer ([place])     chemo/radiation 2000  . Ventral hernia   . COPD (chronic obstructive pulmonary disease) ([place])     smoker  . Vitamin D deficiency   . Tobacco abuse   . Coronary artery disease 10/14/12    50-60% LAD  . Moderate aortic insufficiency 10/11/11    NL LVF  . Glaucoma   . PVD (peripheral vascular disease) ([place]) 10/14/12    dilated Ao root at cath  . Atrial fibrillation (HCC)     NUC, 03/31/2009 - No ECG changes, EKG negative for ischemia, clinical correlation recommeneded, mild anteroapical ischemia  . Chronic atrial fibrillation (HCC)     not on Coumadin secondary to GI bleed  . RBBB   . Aortic valve disorder     2D ECHO, 11/18/2012 - EF 50-55%, normal  . Abnormality of gait 06/10/2015  . Memory change 06/10/2015  . Parkinsonism ([place]) 06/10/2015    Past Surgical History  Procedure Laterality Date  . Ventral hernia repair    . Cardiac catheterization  10/14/2012    Tubular moderate lesion in the mid LAD, proceed with FFR interrogation to determine physiologic significance  . Left heart catheterization with coronary angiogram N/A 10/14/2012    Procedure: LEFT HEART CATHETERIZATION WITH  CORONARY ANGIOGRAM;  Surgeon: Lorretta Harp, MD;  Location: [facility] CATH LAB;  Service: Cardiovascular;  Laterality: N/A;    There were no vitals filed for this visit.  Visit Diagnosis:  Physical deconditioning  Gait abnormality  Balance problems      Subjective Assessment - 09/01/15 0959    Subjective I went to the gym yesterday and I normally go or do something at home every other day.    Currently in Pain? No/denies                         [facility] Adult PT Treatment/Exercise - 09/01/15 0001    Knee/Hip Exercises: Standing   Hip Abduction Stengthening;Both;10 reps;Knee straight  4 lbs each ankle   Abduction Limitations very fatiuing for pt and cuing for correct posture/slow, controlled ROM   Hip Extension Stengthening;Both;10 reps;Knee straight  4 lbs each ankle    Extension Limitations Very fatiguing for pt    Knee/Hip Exercises: Seated   Long Arc Quad Strengthening;Both;10 reps   Long Arc Quad Weight 4 lbs.   Long Arc [employer] Limitations Fatiguing for pt after reps   Marching Strengthening;Both;10 reps;Weights   Marching Limitations Cuing for correct posture and slow,controlled motions   Marching Weights 4 lbs.   Sit to Sand 5 reps;without UE support  For  mini squat, did not do full sit to chair   Shoulder Exercises: Standing   Flexion Strengthening;Both;Weights  8 reps    Shoulder Flexion Weight (lbs) 2   Flexion Limitations Started having Lt shoulder pain so stopped   Other Standing Exercises Unable to do further arm raises due to Lt shoulder pain his wife reports from arthritis                PT Education - 09/01/15 1039    Education provided Yes   Education Details Pushing himself a little harder at the gym when he goes to the point of working up a little sweat or feeling a burn in his muscles, not to the point of complete fatigue! LE strength to do in standing or sitting at home on days he doesn't go to gym and to continue recumbent bike at  home adding more resistance.   Person(s) Educated Patient;Spouse   Methods Explanation;Demonstration;Handout   Comprehension Verbalized understanding;Returned demonstration               Lung Clinic Goals - 08/26/15 1756    Patient will be able to verbalize understanding of the benefit of exercise to decrease fatigue.   Status Achieved   Patient will be able to verbalize the importance of posture.   Status Achieved   Patient will be able to demonstrate diaphragmatic breathing for improved lung function.   Status Achieved   Patient will be able to verbalize understanding of the role of physical therapy to prevent functional decline and who to contact if physical therapy is needed.   Status Achieved         Long Term Clinic Goals - 09/01/15 1217    CC Long Term Goal  #1   Title Patient/wife will be knowledgeable about a home and/or gym exercise program toward improving lung capacity.   Time 1   Period Weeks   Status Partially Met            Plan - 09/01/15 1042    Clinical Impression Statement Pt and wife present for treatment today and pt seemed very attentive with gym instructions issued today about working himself a little harder and to tslk with someone at the gym about possibly helping hi set up a program to follow. Pt tolerated exercises okay today though did fatigue fairly quickly with almost all of them. Unable to tolerate arm raises due to Lt shoulder pain he says from arthritis. Pt and wife unsure if they want to return as he is going to gym and was isssued further exercises to do at home today as well. Did suggest they possibly come back at least one more time in a few weeks and we can assess new exercises issued, how he is doing at gym, and possibly progress HEP if needed.   Pt will benefit from skilled therapeutic intervention in order to improve on the following deficits Cardiopulmonary status limiting activity;Decreased balance;Abnormal gait   Rehab Potential  Good   PT Frequency 3x / week   PT Duration 6 weeks   PT Next Visit Plan Do 6 minute walk test, TUG,check ability to do stairs.  Continue focused endurance training with goal of improving patient's pulmonary function to make him a surgical candidate for excision of tumor.  Include balance re-ed for safety.  Review and revise patients' gym workout program with him for optimal benefit.  Add appropriate goals.   PT Home Exercise Plan See education section   Consulted and Agree  with Plan of Care Patient;Family member/caregiver        Problem List Patient Active Problem List   Diagnosis Date Noted  . Squamous cell lung cancer ([place]) 08/03/2015  . Abnormality of gait 06/10/2015  . Memory change 06/10/2015  . Parkinsonism ([place]) 06/10/2015  . Anemia due to acute blood loss 07/10/2014  . Frequent epistaxis 07/10/2014  . Thoracic aortic aneurysm ([place]) 05/08/2013  . Hyperlipidemia 05/08/2013  . Angina effort, cath 10/14/12 10/15/2012  . CAD (coronary artery disease) 50-60% non critical LAD 10/14/12 10/15/2012  . Abnormal nuclear cardiac imaging test 10/15/2012  . RBBB 10/15/2012  . Aortic insufficiency, moderate 10/15/2012  . Chronic atrial fibrillation, not on Coumadin secondary to history of GI bleeding 10/15/2012  . Prostate cancer history 10/15/2012  . ILD (interstitial lung disease) ([place]) 09/25/2012  . Epistaxis, recurrent 09/25/2012  . Smoker 07/10/2012  . COPD (chronic obstructive pulmonary disease) ([place]) 07/10/2012  . Cancer screening 07/10/2012    SALISBURY,DONNA, PTA 09/01/2015, 12:21 PM  [place], [place], [address] Phone: [phone]   Fax:  [phone]  Name: Jackie Littlejohn MRN: [id number] Date of Birth: 11/12/1933    PLEASE NOTE:  The intention was to have this patient come to therapy 3x/week to work quickly toward improving his lung function with the goal of his being able to tolerate surgery  for resection of his tumor.  He and his wife are hesitant to do this because he has a large copay that is prohibitive.  The patient has an appointment today with his cardiologist and will see his neurologist soon as well; his wife reports that they want to do this before committing to more therapy.  He does go to the [facility] and has Chief of Staff for free Y access, so has the option of doing an independent program there.  Today's session was focused on educating him about how to workout at a training level.  If he returns for further therapy, we will set additional goals.  Serafina Royals, PT 09/01/2015 12:21 PM   PHYSICAL THERAPY DISCHARGE SUMMARY  Visits from Start of Care: 2  Current functional level related to goals / functional outcomes: Goal partially met.   Remaining deficits: Unknown.  The patient chose not to return for recommended follow-up visit.   Education / Equipment: Gym exercise program recommendations. Plan: Patient agrees to discharge.  Patient goals were partially met. Patient is being discharged due to the patient's request.  ?????     Serafina Royals, PT 07/27/16 4:01 PM

## 2015-09-01 NOTE — Progress Notes (Signed)
Oncology Nurse Navigator Documentation  Oncology Nurse Navigator Flowsheets 09/01/2015  Navigator Encounter Type Other/I followed up with dietitian today regarding appt for patient.  Patient is set up and I called insurance company and was told his coverage for dietitian is 100% covered.  I updated dietitian.   Patient Visit Type Other  Treatment Phase Other  Interventions Coordination of Care  Coordination of Care -  Education Method -  Time Spent with Patient 45

## 2015-09-01 NOTE — Patient Instructions (Signed)
Medication Instructions:  Your physician recommends that you continue on your current medications as directed. Please refer to the Current Medication list given to you today.   Labwork: None ordered  Testing/Procedures: None ordered  Follow-Up: Your physician recommends that you schedule a follow-up appointment in: 3 months with Dr.Berry   Any Other Special Instructions Will Be Listed Below (If Applicable).     If you need a refill on your cardiac medications before your next appointment, please call your pharmacy.

## 2015-09-01 NOTE — Progress Notes (Signed)
Oncology Nurse Navigator Documentation  Oncology Nurse Navigator Flowsheets 09/01/2015  Navigator Location [facility]  Navigator Encounter Type Other/I received notification from [facility] that they have not received any notes from [facility].  I called [facility] and they stated they faxed last note and lab work on 08/31/15 with confirmation fax.  I notified Jana Half at [facility] fax has been sent.   Patient Visit Type Other  Treatment Phase Abnormal Scans  Interventions Coordination of Care  Coordination of Care -  Education Method -  Time Spent with Patient 30

## 2015-09-01 NOTE — Patient Instructions (Signed)
Complete all exercises SLOWLY and CONTROLLED!!!            Cancer Rehab [phone]  Extension: Half Squat    Squat, dropping hips back as if sitting in chair (and do in front of chair). Maintain knees over feet. Repeat __10__ times per set. Do __1-2__ sets per session. Do _3___ sessions per week. Use _5-7___ lb weights.  Copyright  VHI. All rights reserved.   KNEE: Extension, Long Arc Quads - Sitting    Raise leg until knee is straight. Add ankle weights if not to heavy and HOLD each rep _3-5_ seconds. __10-15_ reps, _1-2_ sets, _3__ days per week  Copyright  VHI. All rights reserved.   Seated Alternating Leg Raise (Marching)    Sit on chair. Raise bent knee and return. Repeat with other leg. Can wear ankle weights. Do _1-2__ sets of _10__ repetitions.  Copyright  VHI. All rights reserved.    EXTENSION: Standing (Active)    Stand, both feet flat at counter. Draw right leg behind body as far as possible. Use _3-5__ lbs. Complete _1-2__ sets of _10__ repetitions.   http://gtsc.exer.us/76   Copyright  VHI. All rights reserved.   ABDUCTION: Standing (Active)    Stand, feet flat holding counter. Lift right leg out to side. Use _3-5__ lbs. Complete _1-2__ sets of _10__ repetitions.   http://gtsc.exer.us/110   Copyright  VHI. All rights reserved.

## 2015-09-03 ENCOUNTER — Other Ambulatory Visit: Payer: Self-pay | Admitting: Cardiovascular Disease

## 2015-09-06 ENCOUNTER — Telehealth: Payer: Self-pay | Admitting: Dietician

## 2015-09-06 ENCOUNTER — Ambulatory Visit: Payer: Medicare Other | Admitting: Dietician

## 2015-09-06 NOTE — Telephone Encounter (Signed)
Called patient to inform them of the patient's appointment at 3:00 today.  Spoke with the patient and unable to communicate via phone due to increased stuttering.  Called back and spoke with the patient's wife.  She was unaware of the appointment.  [facility] due to newly diagnosed lung cancer.  His wife reports that he is not a surgical candidate due to other medical issues and they are looking at other options.  I discussed the importance of maintaining a good nutritional status to help with overall health and quality of life.  Wife reports that patient is not having any problems at this time.  They are cancelling today's appointment due to weather and above issues.  My name and number was provided if they would like to make an appointment.    Antonieta Iba, RD, LDN [facility]

## 2015-09-07 ENCOUNTER — Telehealth: Payer: Self-pay | Admitting: *Deleted

## 2015-09-07 NOTE — Telephone Encounter (Signed)
Oncology Nurse Navigator Documentation  Oncology Nurse Navigator Flowsheets 09/07/2015  Navigator Location [facility]  Navigator Encounter Type Telephone/I called to follow up with patient regarding his smoking cessation.  He committed to a quit date of 09/13/15.  We discussed tips to quit.  I asked that he call me if he was unsucessful  Patient Visit Type Other  Treatment Phase Other  Barriers/Navigation Needs Education  Education Smoking cessation  Interventions Education Method  Education Method Verbal  Time Spent with Patient 15

## 2015-09-13 ENCOUNTER — Encounter: Payer: Self-pay | Admitting: Neurology

## 2015-09-13 ENCOUNTER — Ambulatory Visit (INDEPENDENT_AMBULATORY_CARE_PROVIDER_SITE_OTHER): Payer: Medicare Other | Admitting: Neurology

## 2015-09-13 VITALS — BP 121/76 | HR 82 | Ht 68.0 in | Wt 180.5 lb

## 2015-09-13 DIAGNOSIS — R269 Unspecified abnormalities of gait and mobility: Secondary | ICD-10-CM

## 2015-09-13 DIAGNOSIS — G20 Parkinson's disease: Secondary | ICD-10-CM | POA: Diagnosis not present

## 2015-09-13 DIAGNOSIS — R413 Other amnesia: Secondary | ICD-10-CM | POA: Diagnosis not present

## 2015-09-13 MED ORDER — CARBIDOPA-LEVODOPA 25-100 MG PO TABS: 1.0000 | ORAL_TABLET | Freq: Three times a day (TID) | ORAL | Status: DC

## 2015-09-13 MED ORDER — DONEPEZIL HCL 5 MG PO TABS: 5.0000 mg | ORAL_TABLET | Freq: Every day | ORAL | Status: DC

## 2015-09-13 NOTE — Progress Notes (Signed)
Reason for visit: Parkinson's disease  Mitchell Wood is an 81 y.o. male  History of present illness:  Mitchell Wood is a 81 year old right-handed black male with a history of Parkinson's disease. The patient has had some difficulty with ambulation, he will freeze when he tries to turn or initiate walking. He denies any falls. He continues to drool. He has been placed on low-dose Sinemet taking the 25/100 mg tablets, one half tablet 3 times daily. His wife indicates that he is gaining benefit from the medication. He has minimal tremor. He denies any difficulty swallowing or choking. He does have a memory disorder that is progressing over time, this continues to be an issue. The patient operates a motor vehicle, he does not report any difficulty with safety issues or with getting lost with driving. The patient recently was diagnosed with squamous cell carcinoma the lung, he is not felt to be a good surgical candidate for resection. He is being followed by cardiology, pulmonology, and oncology. The patient returns for an evaluation. The patient denies any hallucinations on the Sinemet.  Past Medical History  Diagnosis Date  . Anxiety   . ED (erectile dysfunction)   . ETOH abuse     liver disease  . Hematospermia   . Ventral hernia   . COPD (chronic obstructive pulmonary disease) ([place])     smoker  . Vitamin D deficiency   . Tobacco abuse   . Coronary artery disease 10/14/12    50-60% LAD  . Moderate aortic insufficiency 10/11/11    NL LVF  . Glaucoma   . PVD (peripheral vascular disease) ([place]) 10/14/12    dilated Ao root at cath  . Atrial fibrillation (HCC)     NUC, 03/31/2009 - No ECG changes, EKG negative for ischemia, clinical correlation recommeneded, mild anteroapical ischemia  . Chronic atrial fibrillation ([facility])     not on Coumadin secondary to GI bleed  . RBBB   . Aortic valve disorder     2D ECHO, 11/18/2012 - EF 50-55%, normal  . Abnormality of gait 06/10/2015  . Memory change  06/10/2015  . Parkinsonism ([place]) 06/10/2015  . Prostate cancer ([place])     chemo/radiation 2000  . Lung cancer ([place])     early stage    Past Surgical History  Procedure Laterality Date  . Ventral hernia repair    . Cardiac catheterization  10/14/2012    Tubular moderate lesion in the mid LAD, proceed with FFR interrogation to determine physiologic significance  . Left heart catheterization with coronary angiogram N/A 10/14/2012    Procedure: LEFT HEART CATHETERIZATION WITH CORONARY ANGIOGRAM;  Surgeon: Lorretta Harp, MD;  Location: [facility] CATH LAB;  Service: Cardiovascular;  Laterality: N/A;    Family History  Problem Relation Age of Onset  . Emphysema Brother   . Cancer Brother     prostate  . Allergies      whole family  . Diabetes Sister   . Cancer Sister   . Diabetes Child   . Diabetes Child   . Diabetes Child   . Cancer Brother     prostate  . Cancer Brother     prostate  . Cancer Brother     colon  . Cancer Brother     colon    Social history:  reports that he has been smoking Cigarettes.  He has a 50 pack-year smoking history. He has never used smokeless tobacco. He reports that he does not drink alcohol or use  illicit drugs.   No Known Allergies  Medications:  Prior to Admission medications   Medication Sig Start Date End Date Taking? Authorizing Provider  acetaminophen (TYLENOL) 325 MG tablet Take 2 tablets (650 mg total) by mouth every 4 (four) hours as needed. Patient taking differently: Take 650 mg by mouth every 4 (four) hours as needed for mild pain or moderate pain.  10/15/12   Erlene Quan, PA-C  aspirin EC 81 MG tablet Take 81 mg by mouth daily.    Historical Provider, MD  budesonide-formoterol (SYMBICORT) 160-4.5 MCG/ACT inhaler Inhale 2 puffs into the lungs 2 (two) times daily. 09/23/12   Brand Males, MD  carbidopa-levodopa (SINEMET IR) 25-100 MG tablet Take 0.5 tablets by mouth 3 (three) times daily. 06/10/15   Kathrynn Ducking, MD  docusate  sodium 100 MG CAPS Take 100 mg by mouth 2 (two) times daily. Patient taking differently: Take 100 mg by mouth daily.  07/13/14   Donne Hazel, MD  ergocalciferol (VITAMIN D2) 50000 UNITS capsule Take 50,000 Units by mouth 2 (two) times a week.    Historical Provider, MD  ferrous sulfate 325 (65 FE) MG tablet Take 1 tablet (325 mg total) by mouth daily with breakfast. 07/13/14   Donne Hazel, MD  isosorbide mononitrate (IMDUR) 30 MG 24 hr tablet TAKE 1/2 TABLET BY MOUTH DAILY 09/03/15   Lorretta Harp, MD  latanoprost (XALATAN) 0.005 % ophthalmic solution Place 1 drop into both eyes at bedtime. 09/27/14   Historical Provider, MD  metoprolol tartrate (LOPRESSOR) 25 MG tablet Take 1 tablet (25 mg total) by mouth 2 (two) times daily. 07/17/14   Lorretta Harp, MD  nitroGLYCERIN (NITROSTAT) 0.4 MG SL tablet Place 0.4 mg under the tongue every 5 (five) minutes as needed for chest pain.    Historical Provider, MD  sodium chloride (OCEAN) 0.65 % SOLN nasal spray Place 1 spray into both nostrils 3 (three) times daily as needed (For nosebleeds.).     Historical Provider, MD    ROS:  Out of a complete 14 system review of symptoms, the patient complains only of the following symptoms, and all other reviewed systems are negative.  Cough, wheezing, shortness of breath Daytime drowsiness Incontinence of the bladder, urinary urgency Achy muscles Speech difficulty  Blood pressure 121/76, pulse 82, height '5\' 8"'$  (1.727 m), weight 180 lb 8 oz (81.874 kg).  Physical Exam  General: The patient is alert and cooperative at the time of the examination.  Skin: No significant peripheral edema is noted.   Neurologic Exam  Mental status: The patient is alert and oriented x 2 at the time of the examination (not oriented to date). The Mini-Mental Status Examination done today shows a total score of 19/30. The patient is able to name 8 animals in 30 seconds.   Cranial nerves: Facial symmetry is present.  Speech is normal, no aphasia or dysarthria is noted. Extraocular movements are full. Visual fields are full. Masking of the face is seen.  Motor: The patient has good strength in all 4 extremities.  Sensory examination: Soft touch sensation is symmetric on the face, arms, and legs.  Coordination: The patient has good finger-nose-finger and heel-to-shin bilaterally.  Gait and station: The patient has difficulty arising from a seated position with the arms crossed. Once up, the patient is able to ambulate without assistance, has fairly symmetric arm swing. With turns, the patient has hesitation and freezing. Tandem gait is slightly unsteady. Romberg is negative. No tremor  is seen.  Reflexes: Deep tendon reflexes are symmetric.   MRI brain 06/14/2015:  IMPRESSION:  Abnormal MRI brain (without) demonstrating: 1. Mild diffuse, moderate perisylvian and severe anterior/mesial temporal atrophy. 2. Mild-moderate periventricular and subcortical chronic small vessel ischemic disease.  3. No acute findings. No change from CT on 05/19/15.  * MRI scan images were reviewed online. I agree with the written report.    Assessment/Plan:  1. Parkinson's disease  2. Memory disorder  3. Gait disorder  4. Squamous cell carcinoma of the lung  5. Small vessel disease by MRI brain  The patient will be increased on Sinemet taking the 25/100 mg tablets 3 times daily. They are to contact me if increased confusion and hallucinations are noted. The patient desires to go on a medication for memory, we will start low-dose Aricept at this time. They will follow-up in about 4 months, sooner if needed. MRI of the brain does show a moderate level small vessel disease. The patient is on aspirin therapy.  Jill Alexanders MD 09/13/2015 8:16 PM  [facility] [address] [place], [place] [address]  Phone [phone] Fax [phone]

## 2015-09-13 NOTE — Patient Instructions (Addendum)
We will go up on the Sinemet (carbidopa) 25/100 mg tablet three times a day. We will start Aricept 5 mg at night.  Begin Aricept (donepezil) at 5 mg at night for one month. If this medication is well-tolerated, please call our office and we will call in a prescription for the 10 mg tablets. Look out for side effects that may include nausea, diarrhea, weight loss, or stomach cramps. This medication will also cause a runny nose, therefore there is no need for allergy medications for this purpose.   Sinemet (carbidopa) may result in confusion or hallucinations, drowsiness, nausea, or dizziness. If any significant side effects are noted, please contact our office. Sinemet may not be well absorbed when taken with high protein meals, if tolerated it is best to take 30-45 minutes before you eat.  Parkinson Disease Parkinson disease is a disorder of the central nervous system, which includes the brain and spinal cord. A person with this disease slowly loses the ability to completely control body movements. Within the brain, there is a group of nerve cells (basal ganglia) that help control movement. The basal ganglia are damaged and do not work properly in a person with Parkinson disease. In addition, the basal ganglia produce and use a brain chemical called dopamine. The dopamine chemical sends messages to other parts of the body to control and coordinate body movements. Dopamine levels are low in a person with Parkinson disease. If the dopamine levels are low, then the body does not receive the correct messages it needs to move normally.  CAUSES  The exact reason why the basal ganglia get damaged is not known. Some medical researchers have thought that infection, genes, environment, and certain medicines may contribute to the cause.  SYMPTOMS   An early symptom of Parkinson disease is often an uncontrolled shaking (tremor) of the hands. The tremor will often disappear when the affected hand is consciously  used.  As the disease progresses, walking, talking, getting out of a chair, and new movements become more difficult.  Muscles get stiff and movements become slower.  Balance and coordination become harder.  Depression, trouble swallowing, urinary problems, constipation, and sleep problems can occur.  Later in the disease, memory and thought processes may deteriorate. DIAGNOSIS  There are no specific tests to diagnose Parkinson disease. You may be referred to a neurologist for evaluation. Your caregiver will ask about your medical history, symptoms, and perform a physical exam. Blood tests and imaging tests of your brain may be performed to rule out other diseases. The imaging tests may include an MRI or a CT scan. TREATMENT  The goal of treatment is to relieve symptoms. Medicines may be prescribed once the symptoms become troublesome. Medicine will not stop the progression of the disease, but medicine can make movement and balance better and help control tremors. Speech and occupational therapy may also be prescribed. Sometimes, surgical treatment of the brain can be done in young people. HOME CARE INSTRUCTIONS  Get regular exercise and rest periods during the day to help prevent exhaustion and depression.  If getting dressed becomes difficult, replace buttons and zippers with Velcro and elastic on your clothing.  Take all medicine as directed by your caregiver.  Install grab bars or railings in your home to prevent falls.  Go to speech or occupational therapy as directed.  Keep all follow-up visits as directed by your caregiver. SEEK MEDICAL CARE IF:  Your symptoms are not controlled with your medicine.  You fall.  You  have trouble swallowing or choke on your food. MAKE SURE YOU:  Understand these instructions.  Will watch your condition.  Will get help right away if you are not doing well or get worse.   This information is not intended to replace advice given to you by  your health care provider. Make sure you discuss any questions you have with your health care provider.   Document Released: 08/11/2000 Document Revised: 12/09/2012 Document Reviewed: 09/13/2011 Elsevier Interactive Patient Education [employer].

## 2015-09-14 ENCOUNTER — Ambulatory Visit (INDEPENDENT_AMBULATORY_CARE_PROVIDER_SITE_OTHER): Payer: Medicare Other | Admitting: Thoracic Surgery (Cardiothoracic Vascular Surgery)

## 2015-09-14 ENCOUNTER — Encounter: Payer: Self-pay | Admitting: Thoracic Surgery (Cardiothoracic Vascular Surgery)

## 2015-09-14 VITALS — BP 113/71 | HR 100 | Resp 20 | Ht 68.0 in | Wt 180.0 lb

## 2015-09-14 DIAGNOSIS — C3491 Malignant neoplasm of unspecified part of right bronchus or lung: Secondary | ICD-10-CM

## 2015-09-14 NOTE — Progress Notes (Signed)
[address]       [place] [address]             [phone]       Wood: Mitchell Wood returns for a scheduled follow-up visit.  He is a 81 yo man with a past history significant for tobacco abuse (50 py), COPD, chronic atrial fibrillation, moderate CAD, moderate aortic insuffiency, prostate cancer, ethanol abuse, glaucoma, PAD, and recently diagnosed Parkinsonism. He had a CT angio in March 2016 for evaluation of a thoracic aneurysm. There was a RUL nodule which had grown since his prior CT. This was followed with a CT scan in November 2016. The nodule had again increased in size. A CT guided biopsy showed squamous cell carcinoma. A PET/CT showed the right upper lobe nodule was hypermetabolic. There was a 9 mm nodule in the right middle lobe that was hypermetabolic. There were also hypermetabolic inguinal and pelvic lymph nodes. He has known prostate cancer treated with radiation many years ago. He currently receives Lupron injections. His wife says his most recent PSA was 6 down from > 60.  He has hesitant speech. He walks with his wife's cane. He gets SOB with exertion but denies chest pain. He does complain of cough and wheezing at times. Per his wife he has had a general decline and significant memory problems. We had tried to get him into our rehabilitation program to get a better assessment of his activity tolerance. He went to 1 session and did not have any issues, but declined to go to any additional sessions to financial concerns.  He saw Rosaria Ferries in Dr. Kennon Holter office. There was no contraindication to surgery from a cardiac standpoint but he would be high risk for perioperative morbidity mortality.  Past Medical History  Diagnosis Date  . Anxiety   . ED (erectile dysfunction)   . ETOH abuse     liver disease  . Hematospermia   . Ventral hernia   . COPD (chronic obstructive pulmonary disease) ([facility])     smoker  . Vitamin D deficiency   . Tobacco abuse     . Coronary artery disease 10/14/12    50-60% LAD  . Moderate aortic insufficiency 10/11/11    NL LVF  . Glaucoma   . PVD (peripheral vascular disease) ([facility]) 10/14/12    dilated Ao root at cath  . Atrial fibrillation ([facility])     [facility], 03/31/2009 - No ECG changes, EKG negative for ischemia, clinical correlation recommeneded, mild anteroapical ischemia  . Chronic atrial fibrillation ([facility])     not on Coumadin secondary to GI bleed  . RBBB   . Aortic valve disorder     2D ECHO, 11/18/2012 - EF 50-55%, normal  . Abnormality of gait 06/10/2015  . Memory change 06/10/2015  . Parkinsonism ([facility]) 06/10/2015  . Prostate cancer ([facility])     chemo/radiation 2000  . Lung cancer ([facility])     early stage       Current Outpatient Prescriptions  Medication Sig Dispense Refill  . acetaminophen (TYLENOL) 325 MG tablet Take 2 tablets (650 mg total) by mouth every 4 (four) hours as needed. (Patient taking differently: Take 650 mg by mouth every 4 (four) hours as needed for mild pain or moderate pain. )    . aspirin EC 81 MG tablet Take 81 mg by mouth daily.    . budesonide-formoterol (SYMBICORT) 160-4.5 MCG/ACT inhaler Inhale 2 puffs into the lungs 2 (two) times daily.  1 Inhaler 6  . carbidopa-levodopa (SINEMET IR) 25-100 MG tablet Take 1 tablet by mouth 3 (three) times daily. 90 tablet 4  . docusate sodium 100 MG CAPS Take 100 mg by mouth 2 (two) times daily. (Patient taking differently: Take 100 mg by mouth daily. ) 10 capsule 0  . donepezil (ARICEPT) 5 MG tablet Take 1 tablet (5 mg total) by mouth at bedtime. 30 tablet 1  . ergocalciferol (VITAMIN D2) 50000 UNITS capsule Take 50,000 Units by mouth 2 (two) times a week.    . ferrous sulfate 325 (65 FE) MG tablet Take 1 tablet (325 mg total) by mouth daily with breakfast. 30 tablet 0  . isosorbide mononitrate (IMDUR) 30 MG 24 hr tablet TAKE 1/2 TABLET BY MOUTH DAILY 45 tablet 3  . latanoprost (XALATAN) 0.005 % ophthalmic solution Place 1 drop into both eyes at  bedtime.  6  . metoprolol tartrate (LOPRESSOR) 25 MG tablet Take 1 tablet (25 mg total) by mouth 2 (two) times daily. 180 tablet 3  . nitroGLYCERIN (NITROSTAT) 0.4 MG SL tablet Place 0.4 mg under the tongue every 5 (five) minutes as needed for chest pain.    . sodium chloride (OCEAN) 0.65 % SOLN nasal spray Place 1 spray into both nostrils 3 (three) times daily as needed (For nosebleeds.).      No current facility-administered medications for this visit.    Physical Exam BP 113/71 mmHg  Pulse 100  Resp 20  Ht '5\' 8"'$  (1.727 m)  Wt 180 lb (81.647 kg)  BMI 27.38 kg/m2  SpO2 90% Elderly man in no acute distress Speech was slurred and very hesitant, resting tremor present No cervical or supra clavicular adenopathy Lungs clear, no wheezing Cardiac irregularly irregular  Diagnostic Tests: No new tests. I personally reviewed his previous CT and PET CT. Findings as noted in history of present illness.  Impression: 81 year old man with newly diagnosed squamous cell carcinoma of the right upper lobe. This is potentially surgically resectable although there is a second nodule in the right middle lobe that could represent a synchronous tumor. He would be high risk with a cardiac standpoint and also from a neurologic standpoint.  I had a long discussion with Mitchell Wood and his wife. Mitchell Wood has also discussed these issues with their daughter who is a Marine scientist. We discussed possible surgical resection versus radiation therapy with or without chemotherapy. Mitchell Wood biggest concern is his rapid neurologic decline. His family does not feel like he is a candidate for surgery. I think in this case narrow he would be a very high risk candidate and there is a high likelihood of significant morbidity or mortality.  They would like to be referred for consideration for radiation therapy.  Plan: I will arrange a follow-up in our multidisciplinary thoracic oncology clinic to see radiation oncology and Dr.  Julien Nordmann.  I spent 15 minutes with Mitchell Wood during this visit, greater than 50% spent in counseling Melrose Nakayama, MD [facility] [phone]

## 2015-09-20 ENCOUNTER — Telehealth: Payer: Self-pay | Admitting: *Deleted

## 2015-09-20 DIAGNOSIS — C3491 Malignant neoplasm of unspecified part of right bronchus or lung: Secondary | ICD-10-CM

## 2015-09-20 NOTE — Telephone Encounter (Signed)
Oncology Nurse Navigator Documentation  Oncology Nurse Navigator Flowsheets 09/20/2015  Navigator Location -  Navigator Encounter Type Telephone/I called left vm message regarding appt with Dr. Julien Nordmann on 09/23/15 arrive at 12:30.  I also left my name and phone number to call if needed.   Interventions Coordination of Care  Coordination of Care Appts  Education Method -  Time Spent with Patient 15

## 2015-09-21 ENCOUNTER — Encounter: Payer: Self-pay | Admitting: *Deleted

## 2015-09-21 NOTE — Progress Notes (Signed)
Oncology Nurse Navigator Documentation  Oncology Nurse Navigator Flowsheets 09/21/2015  Navigator Location -  Navigator Encounter Type Telephone/patient's wife call me.  She had questions regarding appt and schedule.  I updated and explained  Barriers/Navigation Needs Education  Education Other  Time Spent with Patient 15

## 2015-09-23 ENCOUNTER — Telehealth: Payer: Self-pay | Admitting: Internal Medicine

## 2015-09-23 ENCOUNTER — Encounter: Payer: Self-pay | Admitting: Internal Medicine

## 2015-09-23 ENCOUNTER — Ambulatory Visit: Payer: Medicare Other | Admitting: Physical Therapy

## 2015-09-23 ENCOUNTER — Ambulatory Visit (HOSPITAL_BASED_OUTPATIENT_CLINIC_OR_DEPARTMENT_OTHER): Payer: Medicare Other | Admitting: Internal Medicine

## 2015-09-23 ENCOUNTER — Ambulatory Visit
Admission: RE | Admit: 2015-09-23 | Discharge: 2015-09-23 | Disposition: A | Discharge status: Home / Self Care | Payer: Medicare Other | Source: Ambulatory Visit | Attending: Radiation Oncology | Admitting: Radiation Oncology

## 2015-09-23 ENCOUNTER — Encounter: Payer: Self-pay | Admitting: *Deleted

## 2015-09-23 ENCOUNTER — Encounter: Payer: Self-pay | Admitting: Radiation Oncology

## 2015-09-23 ENCOUNTER — Other Ambulatory Visit (HOSPITAL_BASED_OUTPATIENT_CLINIC_OR_DEPARTMENT_OTHER): Payer: Medicare Other

## 2015-09-23 VITALS — BP 115/58 | HR 94 | Temp 97.6°F | Resp 17 | Ht 68.0 in | Wt 178.1 lb

## 2015-09-23 VITALS — BP 111/63 | HR 80 | Temp 97.8°F | Ht 68.0 in | Wt 179.5 lb

## 2015-09-23 DIAGNOSIS — Z801 Family history of malignant neoplasm of trachea, bronchus and lung: Secondary | ICD-10-CM

## 2015-09-23 DIAGNOSIS — C61 Malignant neoplasm of prostate: Secondary | ICD-10-CM | POA: Diagnosis not present

## 2015-09-23 DIAGNOSIS — C3491 Malignant neoplasm of unspecified part of right bronchus or lung: Secondary | ICD-10-CM

## 2015-09-23 DIAGNOSIS — C3411 Malignant neoplasm of upper lobe, right bronchus or lung: Secondary | ICD-10-CM

## 2015-09-23 DIAGNOSIS — Z923 Personal history of irradiation: Secondary | ICD-10-CM | POA: Insufficient documentation

## 2015-09-23 DIAGNOSIS — I482 Chronic atrial fibrillation: Secondary | ICD-10-CM | POA: Diagnosis not present

## 2015-09-23 DIAGNOSIS — Z8546 Personal history of malignant neoplasm of prostate: Secondary | ICD-10-CM | POA: Diagnosis not present

## 2015-09-23 DIAGNOSIS — Z8042 Family history of malignant neoplasm of prostate: Secondary | ICD-10-CM

## 2015-09-23 DIAGNOSIS — J449 Chronic obstructive pulmonary disease, unspecified: Secondary | ICD-10-CM | POA: Diagnosis not present

## 2015-09-23 DIAGNOSIS — Z800 Family history of malignant neoplasm of digestive organs: Secondary | ICD-10-CM

## 2015-09-23 DIAGNOSIS — Z720 Tobacco use: Secondary | ICD-10-CM

## 2015-09-23 DIAGNOSIS — R05 Cough: Secondary | ICD-10-CM

## 2015-09-23 DIAGNOSIS — Z510 Encounter for antineoplastic radiation therapy: Secondary | ICD-10-CM | POA: Insufficient documentation

## 2015-09-23 LAB — CBC WITH DIFFERENTIAL/PLATELET
BASO%: 0.3 % (ref 0.0–2.0)
BASOS ABS: 0 10*3/uL (ref 0.0–0.1)
EOS ABS: 0.1 10*3/uL (ref 0.0–0.5)
EOS%: 1 % (ref 0.0–7.0)
HCT: 39.4 % (ref 38.4–49.9)
HEMOGLOBIN: 13.2 g/dL (ref 13.0–17.1)
LYMPH%: 27.7 % (ref 14.0–49.0)
MCH: 28.2 pg (ref 27.2–33.4)
MCHC: 33.5 g/dL (ref 32.0–36.0)
MCV: 84.2 fL (ref 79.3–98.0)
MONO#: 0.6 10*3/uL (ref 0.1–0.9)
MONO%: 8.3 % (ref 0.0–14.0)
NEUT#: 4.2 10*3/uL (ref 1.5–6.5)
NEUT%: 62.7 % (ref 39.0–75.0)
Platelets: 183 10*3/uL (ref 140–400)
RBC: 4.68 10*6/uL (ref 4.20–5.82)
RDW: 16.9 % — AB (ref 11.0–14.6)
WBC: 6.8 10*3/uL (ref 4.0–10.3)
lymph#: 1.9 10*3/uL (ref 0.9–3.3)

## 2015-09-23 LAB — COMPREHENSIVE METABOLIC PANEL
ALK PHOS: 63 U/L (ref 40–150)
ALT: <9 U/L (ref 0–55)
AST: 21 U/L (ref 5–34)
Albumin: 3.6 g/dL (ref 3.5–5.0)
Anion Gap: 9 mEq/L (ref 3–11)
BUN: 12.9 mg/dL (ref 7.0–26.0)
CHLORIDE: 103 meq/L (ref 98–109)
CO2: 30 meq/L — AB (ref 22–29)
Calcium: 9.6 mg/dL (ref 8.4–10.4)
Creatinine: 1 mg/dL (ref 0.7–1.3)
EGFR: 84 mL/min/{1.73_m2} — AB (ref >90)
GLUCOSE: 85 mg/dL (ref 70–140)
POTASSIUM: 4.2 meq/L (ref 3.5–5.1)
SODIUM: 142 meq/L (ref 136–145)
Total Bilirubin: 0.5 mg/dL (ref 0.20–1.20)
Total Protein: 7.9 g/dL (ref 6.4–8.3)

## 2015-09-23 NOTE — Progress Notes (Signed)
[facility] Clinical Social Work  Clinical Social Work met with patient/family and Futures trader at [facility] appointment to offer support and assess for psychosocial needs.  Medical oncologist reviewed patient's diagnosis and recommended plan for radiation treatment and follow up with medical oncologist in six months with patient/family.   Mr. Madara was accompanied by his wife, [name].    Clinical Social Work briefly discussed Clinical Social Work role and [employer] support programs/services.  Clinical Social Work encouraged patient to call with any additional questions or concerns.   Polo Riley, MSW, LCSW, OSW-C Clinical Social Worker [facility] [phone]

## 2015-09-23 NOTE — Progress Notes (Signed)
Thoracic Location of Tumor / Histology: Right Upper Lobe,  Squamous Cell Carcinoma   Mitchell Wood states that on 11/10/2013 he had nodule on his on his lung for which his physician did not feel intervention was necessary at that time.  2nd scan on 07/05/15 he ad a CT angio which revealed a .5 x 1.9 x 2.0 cm irregular nodule in the posterior right upper lobe (series 5/image 30), previously 2.6 x 1.0 x 1.1 cm when remeasured in a similar fashion, highly suspicious for primary bronchogenic neoplasm.  Biopsies of Right Lung (if applicable) revealed 97/74/14: Diagnosis Lung, needle/core biopsy(ies), Right upper lobe - INVASIVE SQUAMOUS CELL CARCINOMA, SEE COMMENT  Tobacco/Marijuana/Snuff/ETOH use:   Past/Anticipated interventions by cardiothoracic surgery, if any: Dr. Arne Cleveland - Biopsy of right, upper lobe  Past/Anticipated interventions by medical oncology, if any: Will be seen in [facility] on 09/22/14 in the afternoon  Signs/Symptoms  Weight changes, if any: None  Respiratory complaints, if any: COPD, 02 sat 97%  Hemoptysis, if any: None  Pain issues, if any:  None  SAFETY ISSUES:  Prior radiation? Radiation Therapy at [facility] for Oval or 1998  Pacemaker/ICD? NO  Possible current pregnancy?NO  Is the patient on methotrexate? NO   Current Complaints / other details:  Unsteady Gait: at risk to fall  Hx of Prostate Cancer diagnosed 18 years ago.  Lupron injections varied time frames  - Dr. Rana Snare Hx of A-fib, COPD, Ashtma and Parkinson's Disease

## 2015-09-23 NOTE — Addendum Note (Signed)
Encounter addended by: Malena Edman, RN on: 09/23/2015  6:05 PM<BR>     Documentation filed: Charges VN

## 2015-09-23 NOTE — Progress Notes (Signed)
  Radiation Oncology         [phone]) [phone] ________________________________  Initial Outpatient Consultation - Date: 09/23/2015   Name: Mitchell Wood MRN: [id number]   DOB: 11/12/1933  REFERRING PHYSICIAN: Glendale Chard, MD  DIAGNOSIS AND STAGE: Stage 1 squamous cell lung cancer of the right upper lung  HISTORY OF PRESENT ILLNESS::Mitchell Wood is a 81 y.o. male who was being followed for a stable 1.8 x 1.0 cm right upper lung lesion since 11/10/13 and a prior history of less prominent lung nodules. CT of the head on 05/19/15 and MRI of the brain on 06/11/15 showed no metastasis. CT angiogram of the chest on 07/05/15 showed a 3.5 x 1.9 x 2.0 cm nodule in the right upper lobe and a 12 mm short axis right hilar node. PET scan on 07/28/15 showed an enlarging 2.0 x 2.6 cm hypermetabolic right upper lobe nodule, a 9 mm hypermetabolic right middle lobe nodule, and a 12 mm hypermetabolic left external iliac lymph node. Biopsy of the right upper lobe lung nodules on 08/11/15 by Dr. Lamonte Sakai revealed invasive squamous cell carcinoma. He saw Dr. Roxan Hockey on 09/13/14 who believes that due to the patient's comorbidities that he is not a good candidate for surgical resection. The patient presents to me for the consideration of radiotherapy for the management of his disease. The patient presents to the clinic with his wife.  PREVIOUS RADIATION THERAPY: Yes. According to the patient's wife, 1997-1998: External beam radiation for prostate cancer at [facility].  Past medical, social and family history were reviewed in the electronic chart. Review of symptoms was reviewed in the electronic chart. Medications were reviewed in the electronic chart.   PHYSICAL EXAM:  Filed Vitals:   09/23/15 0920  BP: 111/63  Pulse: 80  Temp: 97.8 F (36.6 C)  .179 lb 8 oz (81.421 kg). In no respiratory distress. Slow speech and mumbling at times makes it difficult to hear. Oriented x 2.  IMPRESSION: Stage 1 squamous cell lung cancer  of the right upper lung  PLAN: We discussed his diagnosis and stage. We discussed radiation in the management of his disease. We discussed 5 fractions of treatment as an outpatient. We discussed the process of CT simulation and the placement of tattoos. We discussed dysphagia, weight loss, and fatigue as the acute side effects of radiation. We discussed damage to critical normal structures in the chest as well as the spinal cord as possible but improbably. We discussed rib fractures as a possible side effect of treatment. Informed consent was signed.   CT simulation is scheduled on 09/28/15 at 1PM.  I spent 40 minutes  face to face with the patient and more than 50% of that time was spent in counseling and/or coordination of care.   ------------------------------------------------  Thea Silversmith, MD  This document serves as a record of services personally performed by Thea Silversmith, MD. It was created on her behalf by Darcus Austin, a trained medical scribe. The creation of this record is based on the scribe's personal observations and the provider's statements to them. This document has been checked and approved by the attending provider.

## 2015-09-23 NOTE — Telephone Encounter (Signed)
Gv pt appts for July and advised c-sched will call to make ct appt.

## 2015-09-23 NOTE — Progress Notes (Signed)
[place] [phone]   [phone] Multidisciplinary thoracic oncology clinic  CONSULT NOTE  REFERRING PHYSICIAN: Dr. Modesto Wood  REASON FOR CONSULTATION:  81 years old African-American male recently diagnosed with lung cancer  HPI Mitchell Wood is a 81 y.o. male with past medical history significant for chronic heart disease, right bundle branch block, chronic atrial fibrillation, COPD, interstitial lung disease, transient ischemic attack in October 2016, history of prostate cancer more than 20 years ago status post radiotherapy and currently on Lupron by Mitchell Wood. The patient was seen by his cardiologist in November 2015 for management of atrial fibrillation. CT scan of the chest without contrast at that time showed irregular shaped posterior right upper lobe lung lesion questionable for mucus impaction within the distal bronchial was versus atypical appearance of neoplasia. Repeat CT angiogram of the chest on 07/05/2015 showed 3.5 x 1.9 x 2.0 cm irregular nodule in the posterior right upper lobe, previously 2.6 x 1.0 x 1.1 cm when remeasured in a similar fashion, highly suspicious for primary bronchogenic neoplasm. There was also 1.2 cm short axis right hilar lymph node and additional small mediastinal nodes measuring up to 6 mm in short axis. A PET scan on 07/28/2015 showed enlarging hypermetabolic right upper lobe nodule highly worrisome for primary bronchogenic carcinoma. There was also hypermetabolic right middle lobe nodule may represent synchronous lesion. The patient also has hypermetabolic left external iliac lymph node which is considered an unusual site for metastatic disease from the primary bronchogenic carcinoma but this could be related to his previous history of prostate cancer. CT-guided core biopsy of the right upper lobe lung nodule was performed by interventional radiology and the final pathology (Accession: [id number]) was  consistent with invasive squamous cell carcinoma. He had CT scan of the head and MRI of the brain in October 2016 that showed no evidence for metastatic disease to the brain. The patient was referred to Mitchell Wood for consideration of surgical resection but he was not a good surgical candidate. He was referred to the multidisciplinary thoracic oncology clinic today for evaluation and discussion of his treatment options. When seen today the patient is feeling fine except for cough but no significant chest pain, shortness of breath or hemoptysis. He denied having any significant weight loss or night sweats. He has occasional headache with stiffness in his neck area but no visual changes. He denied having any significant nausea, vomiting, diarrhea or constipation. Family history significant for mother with a stroke and 5 Brother was different kind of cancer including lung cancer. The patient is married and has 4 children from his previous marriage. He was accompanied today by his wife Mitchell Wood. 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 He has a history of smoking 1 pack per day for around 53 years and unfortunately continues to smoke a few cigarettes every day. He also has a history of alcohol abuse in the past but not recently and no history of drug abuse.  HPI  Past Medical History  Diagnosis Date  . Anxiety   . ED (erectile dysfunction)   . ETOH abuse     liver disease  . Hematospermia   . Ventral hernia   . COPD (chronic obstructive pulmonary disease) ([place])     smoker  . Vitamin D deficiency   . Tobacco abuse   . Coronary artery disease 10/14/12    50-60% LAD  . Moderate aortic insufficiency 10/11/11    NL  LVF  . Glaucoma   . PVD (peripheral vascular disease) ([place]) 10/14/12    dilated Ao root at cath  . Atrial fibrillation (HCC)     NUC, 03/31/2009 - No ECG changes, EKG negative for ischemia, clinical correlation recommeneded, mild anteroapical ischemia  . Chronic  atrial fibrillation (HCC)     not on Coumadin secondary to GI bleed  . RBBB   . Aortic valve disorder     2D ECHO, 11/18/2012 - EF 50-55%, normal  . Abnormality of gait 06/10/2015  . Memory change 06/10/2015  . Parkinsonism ([place]) 06/10/2015  . Prostate cancer ([place])     chemo/radiation 2000  . Lung cancer ([place])     early stage    Past Surgical History  Procedure Laterality Date  . Ventral hernia repair    . Cardiac catheterization  10/14/2012    Tubular moderate lesion in the mid LAD, proceed with FFR interrogation to determine physiologic significance  . Left heart catheterization with coronary angiogram N/A 10/14/2012    Procedure: LEFT HEART CATHETERIZATION WITH CORONARY ANGIOGRAM;  Surgeon: Mitchell Harp, MD;  Location: [facility] CATH LAB;  Service: Cardiovascular;  Laterality: N/A;    Family History  Problem Relation Age of Onset  . Emphysema Brother   . Cancer Brother     prostate  . Allergies      whole family  . Diabetes Sister   . Cancer Sister   . Diabetes Child   . Diabetes Child   . Diabetes Child   . Cancer Brother     prostate  . Cancer Brother     prostate  . Cancer Brother     colon  . Cancer Brother     colon    Social History Social History  Substance Use Topics  . Smoking status: Current Every Day Smoker -- 1.00 packs/day for 50 years    Types: Cigarettes  . Smokeless tobacco: Never Used     Comment: 30+ yr  . Alcohol Use: No    No Known Allergies  Current Outpatient Prescriptions  Medication Sig Dispense Refill  . acetaminophen (TYLENOL) 325 MG tablet Take 2 tablets (650 mg total) by mouth every 4 (four) hours as needed. (Patient taking differently: Take 650 mg by mouth every 4 (four) hours as needed for mild pain or moderate pain. )    . aspirin EC 81 MG tablet Take 81 mg by mouth daily.    . budesonide-formoterol (SYMBICORT) 160-4.5 MCG/ACT inhaler Inhale 2 puffs into the lungs 2 (two) times daily. 1 Inhaler 6  . carbidopa-levodopa (SINEMET  IR) 25-100 MG tablet Take 1 tablet by mouth 3 (three) times daily. 90 tablet 4  . docusate sodium 100 MG CAPS Take 100 mg by mouth 2 (two) times daily. (Patient taking differently: Take 100 mg by mouth daily. ) 10 capsule 0  . donepezil (ARICEPT) 5 MG tablet Take 1 tablet (5 mg total) by mouth at bedtime. 30 tablet 1  . ergocalciferol (VITAMIN D2) 50000 UNITS capsule Take 50,000 Units by mouth 2 (two) times a week.    . ferrous sulfate 325 (65 FE) MG tablet Take 1 tablet (325 mg total) by mouth daily with breakfast. 30 tablet 0  . isosorbide mononitrate (IMDUR) 30 MG 24 hr tablet TAKE 1/2 TABLET BY MOUTH DAILY 45 tablet 3  . latanoprost (XALATAN) 0.005 % ophthalmic solution Place 1 drop into both eyes at bedtime.  6  . metoprolol tartrate (LOPRESSOR) 25 MG tablet Take 1 tablet (25  mg total) by mouth 2 (two) times daily. 180 tablet 3  . nitroGLYCERIN (NITROSTAT) 0.4 MG SL tablet Place 0.4 mg under the tongue every 5 (five) minutes as needed for chest pain.    . sodium chloride (OCEAN) 0.65 % SOLN nasal spray Place 1 spray into both nostrils 3 (three) times daily as needed (For nosebleeds.).      No current facility-administered medications for this visit.    Review of Systems  Constitutional: positive for fatigue Eyes: negative Ears, nose, mouth, throat, and face: negative Respiratory: positive for cough Cardiovascular: negative Gastrointestinal: negative Genitourinary:negative Integument/breast: negative Hematologic/lymphatic: negative Musculoskeletal:positive for muscle weakness Neurological: negative Behavioral/Psych: negative Endocrine: negative Allergic/Immunologic: negative  Physical Exam  JAS:NKNLZ, healthy, no distress, well nourished and well developed SKIN: skin color, texture, turgor are normal, no rashes or significant lesions HEAD: Normocephalic, No masses, lesions, tenderness or abnormalities EYES: normal, PERRLA, Conjunctiva are pink and non-injected EARS: External  ears normal, Canals clear OROPHARYNX:no exudate, no erythema and lips, buccal mucosa, and tongue normal  NECK: supple, no adenopathy, no JVD LYMPH:  no palpable lymphadenopathy, no hepatosplenomegaly LUNGS: clear to auscultation , and palpation HEART: regular rate & rhythm, no murmurs and no gallops ABDOMEN:abdomen soft, non-tender, normal bowel sounds and no masses or organomegaly BACK: Back symmetric, no curvature., No CVA tenderness EXTREMITIES:no joint deformities, effusion, or inflammation, no edema, no skin discoloration  NEURO: alert & oriented x 3 with fluent speech, no focal motor/sensory deficits  PERFORMANCE STATUS: ECOG 1  LABORATORY DATA: Lab Results  Component Value Date   WBC 6.8 09/23/2015   HGB 13.2 09/23/2015   HCT 39.4 09/23/2015   MCV 84.2 09/23/2015   PLT 183 09/23/2015      Chemistry      Component Value Date/Time   NA 142 09/23/2015 1227   NA 140 07/02/2015 1422   NA 139 05/19/2015 1346   K 4.2 09/23/2015 1227   K 4.8 07/02/2015 1422   CL 102 07/02/2015 1422   CO2 30* 09/23/2015 1227   CO2 31* 07/02/2015 1422   BUN 12.9 09/23/2015 1227   BUN 14 07/02/2015 1422   BUN 16 05/19/2015 1346   CREATININE 1.0 09/23/2015 1227   CREATININE 0.95 07/02/2015 1422   CREATININE 0.91 11/06/2013 1321      Component Value Date/Time   CALCIUM 9.6 09/23/2015 1227   CALCIUM 9.4 07/02/2015 1422   ALKPHOS 63 09/23/2015 1227   ALKPHOS 52 12/14/2014 1204   AST 21 09/23/2015 1227   AST 24 12/14/2014 1204   ALT <9 09/23/2015 1227   ALT 14 12/14/2014 1204   BILITOT 0.50 09/23/2015 1227   BILITOT 0.6 12/14/2014 1204       RADIOGRAPHIC STUDIES: No results found.  ASSESSMENT: This is a very pleasant 81 years old African-American male recently diagnosed with a stage IIb (T3, N0, M0) non-small cell lung cancer, squamous cell carcinoma presented with right upper lobe lung nodule in addition to questionable nodules in the right middle lobe diagnosed in December  2016.   PLAN: I had a lengthy discussion with the patient and his wife today about his current disease stage, prognosis and treatment options. The patient is not a good surgical candidate for resection because of his poor performance status as well as COPD and poor pulmonary function. I had a lengthy discussion with the patient today about his condition. I recommended for him to consider a course of curative radiotherapy by Dr. Pablo Ledger. He is not a good candidate for concurrent chemotherapy  as the patient may benefit from a short course of curative radiation. I recommended for the patient to continue on observation after his treatment with close monitoring and imaging studies. I will see him back for follow-up visit in 6 months with repeat CT scan of the chest for the staging of his disease. The patient was advised to call immediately if he has any concerning symptoms in the interval. He was seen today by medical oncology, radiation oncology, thoracic navigator, social worker and physical therapist. The patient was advised to call immediately if he has any concerning symptoms in the interval. The patient voices understanding of current disease status and treatment options and is in agreement with the current care plan.  All questions were answered. The patient knows to call the clinic with any problems, questions or concerns. We can certainly see the patient much sooner if necessary.  Thank you so much for allowing me to participate in the care of Mitchell Wood. I will continue to follow up the patient with you and assist in his care.  I spent 40 minutes counseling the patient face to face. The total time spent in the appointment was 60 minutes.  Disclaimer: This note was dictated with voice recognition software. Similar sounding words can inadvertently be transcribed and may not be corrected upon review.   Caffie Sotto K. September 23, 2015, 1:35 PM

## 2015-09-23 NOTE — Progress Notes (Signed)
Oncology Nurse Navigator Documentation  Oncology Nurse Navigator Flowsheets 09/23/2015  Navigator Location [facility]Med Onc  Navigator Encounter Type Clinic/MDC/spoke with patient and wife today.  I updated him on next steps. We also discussed smoking cessation.   Barriers/Navigation Needs Education  Education Other  Time Spent with Patient 15

## 2015-09-28 ENCOUNTER — Ambulatory Visit
Admission: RE | Admit: 2015-09-28 | Discharge: 2015-09-28 | Disposition: A | Discharge status: Home / Self Care | Payer: Medicare Other | Source: Ambulatory Visit | Attending: Radiation Oncology | Admitting: Radiation Oncology

## 2015-09-28 DIAGNOSIS — Z510 Encounter for antineoplastic radiation therapy: Secondary | ICD-10-CM | POA: Diagnosis not present

## 2015-09-28 DIAGNOSIS — C3491 Malignant neoplasm of unspecified part of right bronchus or lung: Secondary | ICD-10-CM

## 2015-09-28 NOTE — Progress Notes (Signed)
[facility] [place] Radiation Oncology Simulation and Treatment Planning Note   Name: Mitchell Wood MRN: [id number]  Date: 09/28/2015  DOB: 11/12/1933   DIAGNOSIS: The encounter diagnosis was Squamous cell lung cancer, right ([place]).  CONSENT VERIFIED: yes  SET UP AND IMMOBILIZATION: Patient is setup supine in a vac loc with a custom moldable pillow for head and neck immobilization   NARRATIVE: The patient was brought to the [facility].  Identity was confirmed.  All relevant records and images related to the planned course of therapy were reviewed.  Then, the patient was positioned in a stable reproducible clinical set-up for radiation therapy.  CT images were obtained.  Skin markings were placed.  A four dimensional simulation was then performed to track tumor movement throughout the patients' breathing cycle. The CT images were loaded into the planning software where the target and avoidance structures were contoured.  The GTV was outlined on the free breathing, 4D and MIP image sets.  The radiation prescription was entered and confirmed.   TREATMENT PLANNING NOTE:  Treatment planning then occurred. I have requested 3D simulation with [facility] of the spinal cord, total lungs and gross tumor volume. I have also requested mlcs and an isodose plan.   Special treatment procedure will be performed as [name] will be receiving high dose per fraction.    A total of 4 complex treatment devices will be used

## 2015-09-29 ENCOUNTER — Telehealth: Payer: Self-pay | Admitting: *Deleted

## 2015-09-29 NOTE — Telephone Encounter (Signed)
Oncology Nurse Navigator Documentation  Oncology Nurse Navigator Flowsheets 09/29/2015  Navigator Encounter Type Telephone/I called today to check up on Mitchell Wood and to see how his smoking cessation was going. I was unable to reach.  I left vm message to call me with my name and phone number   Telephone Outgoing Call  Barriers/Navigation Needs Education  Education Smoking cessation  Acuity Level 1  Time Spent with Patient 15

## 2015-10-07 DIAGNOSIS — Z510 Encounter for antineoplastic radiation therapy: Secondary | ICD-10-CM | POA: Diagnosis not present

## 2015-10-12 ENCOUNTER — Ambulatory Visit
Admission: RE | Admit: 2015-10-12 | Discharge: 2015-10-12 | Disposition: A | Discharge status: Home / Self Care | Payer: Medicare Other | Source: Ambulatory Visit | Attending: Radiation Oncology | Admitting: Radiation Oncology

## 2015-10-12 ENCOUNTER — Encounter: Payer: Self-pay | Admitting: Radiation Oncology

## 2015-10-12 DIAGNOSIS — Z510 Encounter for antineoplastic radiation therapy: Secondary | ICD-10-CM | POA: Diagnosis not present

## 2015-10-12 DIAGNOSIS — C3491 Malignant neoplasm of unspecified part of right bronchus or lung: Secondary | ICD-10-CM

## 2015-10-12 NOTE — Progress Notes (Signed)
Mitchell Wood has received 1 SBRT treatment to his RUL.  He denies any pain nor SOB and VSS with 02 sat of 97%.

## 2015-10-12 NOTE — Progress Notes (Signed)
Weekly Management Note Current Dose:  12 Gy  Projected Dose: 60 Gy   Narrative:  The patient presents for routine under treatment assessment.  CBCT/MVCT images/Port film x-rays were reviewed.  The chart was checked. Doing well. Shoulder hurts after treatment.   Physical Findings: Weight:  . Unchanged  Impression:  The patient is tolerating radiation.  Plan:  Continue treatment as planned. Follow up in 1 month.

## 2015-10-14 ENCOUNTER — Ambulatory Visit
Admission: RE | Admit: 2015-10-14 | Discharge: 2015-10-14 | Disposition: A | Discharge status: Home / Self Care | Payer: Medicare Other | Source: Ambulatory Visit | Attending: Radiation Oncology | Admitting: Radiation Oncology

## 2015-10-14 DIAGNOSIS — Z510 Encounter for antineoplastic radiation therapy: Secondary | ICD-10-CM | POA: Diagnosis not present

## 2015-10-15 ENCOUNTER — Ambulatory Visit: Payer: Medicare Other | Admitting: Radiation Oncology

## 2015-10-18 ENCOUNTER — Encounter: Payer: Self-pay | Admitting: Radiation Oncology

## 2015-10-18 ENCOUNTER — Ambulatory Visit
Admission: RE | Admit: 2015-10-18 | Discharge: 2015-10-18 | Disposition: A | Discharge status: Home / Self Care | Payer: Medicare Other | Source: Ambulatory Visit | Attending: Radiation Oncology | Admitting: Radiation Oncology

## 2015-10-18 DIAGNOSIS — Z510 Encounter for antineoplastic radiation therapy: Secondary | ICD-10-CM | POA: Diagnosis not present

## 2015-10-20 ENCOUNTER — Ambulatory Visit
Admission: RE | Admit: 2015-10-20 | Discharge: 2015-10-20 | Disposition: A | Discharge status: Home / Self Care | Payer: Medicare Other | Source: Ambulatory Visit | Attending: Radiation Oncology | Admitting: Radiation Oncology

## 2015-10-20 DIAGNOSIS — Z510 Encounter for antineoplastic radiation therapy: Secondary | ICD-10-CM | POA: Diagnosis not present

## 2015-10-22 ENCOUNTER — Encounter: Payer: Self-pay | Admitting: Radiation Oncology

## 2015-10-22 ENCOUNTER — Ambulatory Visit
Admission: RE | Admit: 2015-10-22 | Discharge: 2015-10-22 | Disposition: A | Discharge status: Home / Self Care | Payer: Medicare Other | Source: Ambulatory Visit | Attending: Radiation Oncology | Admitting: Radiation Oncology

## 2015-10-22 DIAGNOSIS — Z510 Encounter for antineoplastic radiation therapy: Secondary | ICD-10-CM | POA: Diagnosis not present

## 2015-10-25 NOTE — Progress Notes (Signed)
°  Radiation Oncology         [phone] ________________________________  Name: Mitchell Wood MRN: [id number]  Date: 10/22/2015  DOB: 11/12/1933  End of Treatment Note  Diagnosis:   Squamous cell lung cancer ([place])   Staging form: Lung, AJCC 7th Edition     Clinical stage from 09/23/2015: Stage IIB (T3, N0, M0) - Signed by Curt Bears, MD on 09/23/2015  Indication for treatment:  Curative       Radiation treatment dates:   10/12/2015-10/22/2015  Site/dose:   Right upper lobe / 60 Gy in 5 fractions  Beams/energy:   6MV photons with VMAT.   Narrative: The patient tolerated radiation treatment relatively well.   He had no ill effects of treatment.   Plan: The patient has completed radiation treatment. The patient will return to radiation oncology clinic for routine followup in one month. I advised them to call or return sooner if they have any questions or concerns related to their recovery or treatment.  ------------------------------------------------  Thea Silversmith, MD

## 2015-11-05 ENCOUNTER — Telehealth: Payer: Self-pay | Admitting: *Deleted

## 2015-11-05 NOTE — Telephone Encounter (Signed)
Oncology Nurse Navigator Documentation  Oncology Nurse Navigator Flowsheets 11/05/2015  Navigator Location -  Navigator Encounter Type Telephone/I called Mr. Morgenthaler to follow up on his smoking cessation.  I left my name and phone number to call if needed assistance  Telephone Outgoing Call  Abnormal Finding Date 07/15/2015  Confirmed Diagnosis Date 08/11/2015  Treatment Phase Post-Tx Follow-up  Barriers/Navigation Needs Education  Education Smoking cessation  Interventions Education Method  Acuity Level 1  Time Spent with Patient 15

## 2015-11-21 NOTE — Progress Notes (Deleted)
   Weight changes, if any:  Respiratory complaints, if any:  Hemoptysis, if any:   Swallowing Problems/Pain/Difficulty swallowing: Smoking Tobacco/Marijuana/Snuff/ETOH use: Skin: Pain :  Appetite: Energy level: When is next chemo scheduled?: Lab work from of chart:                  Weight changes, if any:  Respiratory complaints, if any:  Hemoptysis, if any:   Swallowing Problems/Pain/Difficulty swallowing: Smoking Tobacco/Marijuana/Snuff/ETOH use: Skin: Pain :  Appetite: Energy level: When is next chemo scheduled?: Lab work from of chart:                  Weight changes, if any:  Respiratory complaints, if any:  Hemoptysis, if any:   Swallowing Problems/Pain/Difficulty swallowing: Smoking Tobacco/Marijuana/Snuff/ETOH use: Skin: Pain :  Appetite: Energy level: When is next chemo scheduled?: Lab work from of chart:

## 2015-11-22 NOTE — Progress Notes (Signed)
  Mr. Janes is here for a one month follow up visit for squamous cell lung cancer of the right upper lung  Weight changes, if any:  Wt Readings from Last 3 Encounters:  11/25/15 178 lb (80.74 kg)  10/12/15 178 lb (80.74 kg)  09/23/15 178 lb 1.6 oz (80.786 kg)   Respiratory complaints, if any:SOB at times,coughing clear white secretions,O2 sat 99% Hemoptysis, if any: No Swallowing Problems/Pain/Difficulty swallowing: NoneNormal color Smoking Tobacco/Marijuana/Snuff/ETOH use: Smoked around 55 years 1p/d still smoking a few cigarettes a day  Skin:Normal color  BP 100/68 mmHg  Pulse 90  Temp(Src) 97.7 F (36.5 C) (Oral)  Resp 18  Ht '5\' 8"'$  (1.727 m)  Wt 178 lb (80.74 kg)  BMI 27.07 kg/m2  SpO2 99%

## 2015-11-25 ENCOUNTER — Encounter: Payer: Self-pay | Admitting: Radiation Oncology

## 2015-11-25 ENCOUNTER — Ambulatory Visit
Admission: RE | Admit: 2015-11-25 | Discharge: 2015-11-25 | Disposition: A | Discharge status: Home / Self Care | Payer: Medicare Other | Source: Ambulatory Visit | Attending: Radiation Oncology | Admitting: Radiation Oncology

## 2015-11-25 ENCOUNTER — Encounter: Payer: Self-pay | Admitting: *Deleted

## 2015-11-25 VITALS — BP 100/68 | HR 90 | Temp 97.7°F | Resp 18 | Ht 68.0 in | Wt 178.0 lb

## 2015-11-25 DIAGNOSIS — C3491 Malignant neoplasm of unspecified part of right bronchus or lung: Secondary | ICD-10-CM

## 2015-11-25 NOTE — Progress Notes (Signed)
   Department of Radiation Oncology  Phone:  [phone] Fax:        [phone]   Name: Jaylyn Booher MRN: [id number]  DOB: 11/12/1933  Date: 11/25/2015  Follow Up Visit Note  Diagnosis: Squamous cell lung cancer [facility])   Staging form: Lung, AJCC 7th Edition     Clinical stage from 09/23/2015: Stage IIB (T3, N0, M0) - Signed by Curt Bears, MD on 09/23/2015   Summary and Interval since last radiation: 1 month; 10/12/2015-10/22/2015  Site/dose:   Right upper lobe / 60 Gy in 5 fractions  Interval History: Ruvim presents today for routine followup. He is doing well after radiation treatment. He has an appointment with Dr. Lamonte Sakai scheduled for tomorrow. I will plan on a CT scan in a couple weeks. He is accompanied by his wife today.   Today, he reports SOB at times. He is coughing clear white secretions. He denies hemoptysis. His wife reports that he is sleeping more and feeling fatigued more often. He reports that his left leg "gives out" at times.   Physical Exam:  Filed Vitals:   11/25/15 1415  BP: 100/68  Pulse: 90  Temp: 97.7 F (36.5 C)  TempSrc: Oral  Resp: 18  Height: '5\' 8"'$  (1.727 m)  Weight: 178 lb (80.74 kg)  SpO2: 99%    This is a male in no acute distress. He is alert and oriented. No skin changes noted.   IMPRESSION: Jaquaveon is a 81 y.o. male with stage IIB (T3, N0, M0) squamous cell lung cancer.   PLAN:  CT in 2 weeks. I will then follow up with CT in 6 months. I will cancel July appointment that is scheduled with Dr. Earlie Server.   ------------------------------------------------  Thea Silversmith, MD  This document serves as a record of services personally performed by Thea Silversmith, MD. It was created on her behalf by Jenell Milliner, a trained medical scribe. The creation of this record is based on the scribe's personal observations and the provider's statements to them. This document has been checked and approved by the attending provider.

## 2015-11-25 NOTE — Progress Notes (Signed)
Deleted the 11/21/15 note, as requested by Guadelupe Sabin, since this was a duplicate of her 11/12/38 note.

## 2015-11-26 ENCOUNTER — Ambulatory Visit: Payer: Medicare Other | Admitting: Emergency Medicine

## 2015-11-26 ENCOUNTER — Telehealth: Payer: Self-pay | Admitting: *Deleted

## 2015-11-26 NOTE — Telephone Encounter (Signed)
CALLED PATIENT TO INFORM OF CT AND FU IN 05-2016, LVM FOR A RETURN CALL

## 2015-11-29 ENCOUNTER — Telehealth: Payer: Self-pay | Admitting: *Deleted

## 2015-11-29 NOTE — Telephone Encounter (Signed)
RETURNED PATIENT'S PHONE CALL, LVM FOR A RETURN CALL 

## 2015-12-03 ENCOUNTER — Ambulatory Visit (INDEPENDENT_AMBULATORY_CARE_PROVIDER_SITE_OTHER): Payer: Medicare Other | Admitting: Cardiovascular Disease

## 2015-12-03 ENCOUNTER — Encounter: Payer: Self-pay | Admitting: Cardiovascular Disease

## 2015-12-03 VITALS — BP 100/70 | HR 66 | Ht 68.0 in | Wt 175.0 lb

## 2015-12-03 DIAGNOSIS — I351 Nonrheumatic aortic (valve) insufficiency: Secondary | ICD-10-CM | POA: Diagnosis not present

## 2015-12-03 DIAGNOSIS — E785 Hyperlipidemia, unspecified: Secondary | ICD-10-CM

## 2015-12-03 DIAGNOSIS — I2583 Coronary atherosclerosis due to lipid rich plaque: Principal | ICD-10-CM

## 2015-12-03 DIAGNOSIS — Z720 Tobacco use: Secondary | ICD-10-CM

## 2015-12-03 DIAGNOSIS — I712 Thoracic aortic aneurysm, without rupture, unspecified: Secondary | ICD-10-CM

## 2015-12-03 DIAGNOSIS — I2510 Atherosclerotic heart disease of native coronary artery without angina pectoris: Secondary | ICD-10-CM | POA: Diagnosis not present

## 2015-12-03 DIAGNOSIS — F17200 Nicotine dependence, unspecified, uncomplicated: Secondary | ICD-10-CM

## 2015-12-03 NOTE — Assessment & Plan Note (Signed)
Mild aortic insufficiency by 2-D echocardiogram performed last year

## 2015-12-03 NOTE — Assessment & Plan Note (Signed)
Mild dilatation of the thoracic aorta measuring 4.2 cm by CT angiography.

## 2015-12-03 NOTE — Progress Notes (Signed)
12/03/2015 [name]   11/12/1933  [id number]  Primary Physician Maximino Greenland, MD Primary Cardiologist: Lorretta Harp MD [name]   HPI:  The patient is a pleasant 81 year old male who we saw 07/17/14, as a referral from Dr. Chase Caller after it was noted on a CT scan that he had coronary calcifications. On interview, the patient did admit to some chest pain, especially when he was at the gym. We decided to proceed with a diagnostic catheterization as the patient had had a previous Myoview, August 2010, that was not entirely normal, showing mild ischemia in the mid-anterior and apical regions. The patient underwent diagnostic catheterization by Dr. [name], October 14, 2012. This revealed a calcified 50% to 60% narrowing in the mid LAD, but no other significant coronary disease. He had preserved LV function. He had a generous descending thoracic aorta. He may require a CT angiogram in the future to further quantify this. He saw Kerin Ransom [facility] back in February and has been doing well since. He denies chest pain or shortness of breath. A CT and a gram performed in March showed a moderate sized thoracic aortic aneurysm measuring 4.2 cm. The patient does continue to smoke.he was recently admitted with a nosebleed requiring transfusion. He has chronic atrial fibrillation and had been on Coumadin in the past which was discontinued because of GI bleeding. He denies chest pain or shortness of breath. Unfortunately, he is grossly diagnosed with lung cancer and is undergoing 5 rounds of radiation therapy. He's been seen by Dr. Pablo Ledger, Dr. Earlie Server and Dr. Roxan Hockey. The consensus is that he is not a candidate for surgical resection.   Current Outpatient Prescriptions  Medication Sig Dispense Refill  . acetaminophen (TYLENOL) 325 MG tablet Take 2 tablets (650 mg total) by mouth every 4 (four) hours as needed.    Marland Kitchen aspirin EC 81 MG tablet Take 81 mg by mouth daily.    .  budesonide-formoterol (SYMBICORT) 160-4.5 MCG/ACT inhaler Inhale 2 puffs into the lungs 2 (two) times daily. 1 Inhaler 6  . carbidopa-levodopa (SINEMET IR) 25-100 MG tablet Take 1 tablet by mouth 3 (three) times daily. 90 tablet 4  . docusate sodium 100 MG CAPS Take 100 mg by mouth 2 (two) times daily. (Patient taking differently: Take 100 mg by mouth daily. ) 10 capsule 0  . donepezil (ARICEPT) 5 MG tablet Take 1 tablet (5 mg total) by mouth at bedtime. 30 tablet 1  . ergocalciferol (VITAMIN D2) 50000 UNITS capsule Take 50,000 Units by mouth 2 (two) times a week.    . ferrous sulfate 325 (65 FE) MG tablet Take 1 tablet (325 mg total) by mouth daily with breakfast. 30 tablet 0  . isosorbide mononitrate (IMDUR) 30 MG 24 hr tablet TAKE 1/2 TABLET BY MOUTH DAILY 45 tablet 3  . latanoprost (XALATAN) 0.005 % ophthalmic solution Place 1 drop into both eyes at bedtime.  6  . metoprolol tartrate (LOPRESSOR) 25 MG tablet Take 1 tablet (25 mg total) by mouth 2 (two) times daily. 180 tablet 3  . nitroGLYCERIN (NITROSTAT) 0.4 MG SL tablet Place 0.4 mg under the tongue every 5 (five) minutes as needed for chest pain. Reported on 11/25/2015    . sodium chloride (OCEAN) 0.65 % SOLN nasal spray Place 1 spray into both nostrils 3 (three) times daily as needed (For nosebleeds.).      No current facility-administered medications for this visit.    No Known Allergies  Social History   Social  History  . Marital Status: Married    Spouse Name: N/A  . Number of Children: 4  . Years of Education: N/A   Occupational History  . retired    Social History Main Topics  . Smoking status: Current Every Day Smoker -- 1.00 packs/day for 50 years    Types: Cigarettes  . Smokeless tobacco: Never Used     Comment: 30+ yr  . Alcohol Use: No  . Drug Use: No  . Sexual Activity: Not on file   Other Topics Concern  . Not on file   Social History Narrative   Patient drinks about 4-5 cups of caffeine daily.   Patient  is right handed.      Review of Systems: General: negative for chills, fever, night sweats or weight changes.  Cardiovascular: negative for chest pain, dyspnea on exertion, edema, orthopnea, palpitations, paroxysmal nocturnal dyspnea or shortness of breath Dermatological: negative for rash Respiratory: negative for cough or wheezing Urologic: negative for hematuria Abdominal: negative for nausea, vomiting, diarrhea, bright red blood per rectum, melena, or hematemesis Neurologic: negative for visual changes, syncope, or dizziness All other systems reviewed and are otherwise negative except as noted above.    Blood pressure 100/70, pulse 66, height '5\' 8"'$  (1.727 m), weight 175 lb (79.379 kg).  General appearance: alert and no distress Neck: no adenopathy, no carotid bruit, no JVD, supple, symmetrical, trachea midline and thyroid not enlarged, symmetric, no tenderness/mass/nodules Lungs: clear to auscultation bilaterally Heart: irregularly irregular rhythm Extremities: extremities normal, atraumatic, no cyanosis or edema  EKG not performed today  ASSESSMENT AND PLAN:   CAD (coronary artery disease) 50-60% non critical LAD 10/14/12 History of noncritical CAD by cath performed 10/14/12 after an abnormal Myoview stress test. He denies chest pain.  Aortic insufficiency, moderate Mild aortic insufficiency by 2-D echocardiogram performed last year  Smoker Continued tobacco abuse recalcitrant to risk factor modification despite recent diagnosis of lung cancer  Thoracic aortic aneurysm Mild dilatation of the thoracic aorta measuring 4.2 cm by CT angiography.  Hyperlipidemia History of hyperlipidemia currently not on statin therapy.      Lorretta Harp MD FACP,FACC,FAHA, [facility] 12/03/2015 11:24 AM

## 2015-12-03 NOTE — Assessment & Plan Note (Signed)
History of noncritical CAD by cath performed 10/14/12 after an abnormal Myoview stress test. He denies chest pain.

## 2015-12-03 NOTE — Patient Instructions (Signed)

## 2015-12-03 NOTE — Assessment & Plan Note (Signed)
Continued tobacco abuse recalcitrant to risk factor modification despite recent diagnosis of lung cancer

## 2015-12-03 NOTE — Assessment & Plan Note (Signed)
History of hyperlipidemia currently not on statin therapy.

## 2015-12-09 ENCOUNTER — Ambulatory Visit (HOSPITAL_COMMUNITY)
Admission: RE | Admit: 2015-12-09 | Discharge: 2015-12-09 | Disposition: A | Discharge status: Home / Self Care | Payer: Medicare Other | Source: Ambulatory Visit | Attending: Radiation Oncology | Admitting: Radiation Oncology

## 2015-12-09 ENCOUNTER — Encounter (HOSPITAL_COMMUNITY): Payer: Self-pay

## 2015-12-09 DIAGNOSIS — R918 Other nonspecific abnormal finding of lung field: Secondary | ICD-10-CM | POA: Diagnosis not present

## 2015-12-09 DIAGNOSIS — C3491 Malignant neoplasm of unspecified part of right bronchus or lung: Secondary | ICD-10-CM | POA: Insufficient documentation

## 2015-12-10 ENCOUNTER — Ambulatory Visit: Payer: Self-pay | Admitting: Radiation Oncology

## 2015-12-16 NOTE — Progress Notes (Signed)
Mitchell Wood is here for FU visit for squamous cell lung cancer right lung and CT chest results.  Weight changes, if any:  Wt Readings from Last 3 Encounters:  12/23/15 179 lb (81.194 kg)  12/03/15 175 lb (79.379 kg)  11/25/15 178 lb (80.74 kg)   Respiratory complaints, if any:SOB,coughing up white secretions during the day.  Hemoptysis, if any:No   Swallowing Problems/Pain/Difficulty swallowing:None Smoking Tobacco/Marijuana/Snuff/ETOH DDU:KGURKY x 53 years still smoking a few cigarettes a day. Skin:Normal Pain : 2/10 left knee Appetite:Good Energy level:Having some fatigue stays in the bed until 9 a.m. BP 131/77 mmHg  Pulse 77  Temp(Src) 97.8 F (36.6 C) (Oral)  Resp 18  Ht '5\' 8"'$  (1.727 m)  Wt 179 lb (81.194 kg)  BMI 27.22 kg/m2  SpO2 95% When is next chemo scheduled?: Lab work from of chart:No recent labs

## 2015-12-23 ENCOUNTER — Encounter: Payer: Self-pay | Admitting: Radiation Oncology

## 2015-12-23 ENCOUNTER — Ambulatory Visit
Admission: RE | Admit: 2015-12-23 | Discharge: 2015-12-23 | Disposition: A | Discharge status: Home / Self Care | Payer: Medicare Other | Source: Ambulatory Visit | Attending: Radiation Oncology | Admitting: Radiation Oncology

## 2015-12-23 ENCOUNTER — Telehealth: Payer: Self-pay | Admitting: Internal Medicine

## 2015-12-23 VITALS — BP 131/77 | HR 77 | Temp 97.8°F | Resp 18 | Ht 68.0 in | Wt 179.0 lb

## 2015-12-23 DIAGNOSIS — C3491 Malignant neoplasm of unspecified part of right bronchus or lung: Secondary | ICD-10-CM

## 2015-12-23 NOTE — Telephone Encounter (Signed)
Dr went worth office called to cx appt for pt..NO explanation

## 2015-12-23 NOTE — Progress Notes (Signed)
   Department of Radiation Oncology  Phone:  [phone] Fax:        [phone]   Name: Mitchell Wood MRN: [id number]  DOB: 11/12/1933  Date: 12/23/2015  Follow Up Visit Note  Diagnosis: Squamous cell lung cancer ([place])   Staging form: Lung, AJCC 7th Edition     Clinical stage from 09/23/2015: Stage IIB (T3, N0, M0) - Signed by Curt Bears, MD on 09/23/2015  Summary and Interval since last radiation: 2 month; 10/12/2015-10/22/2015  Site/dose:   Right upper lobe / 60 Gy in 5 fractions  Interval History: Mitchell Wood is here for FU visit for squamous cell lung cancer right lung and CT chest results.  12/09/14 Chest CT findings indicated below:  1. Slight interval decrease in size of the irregular lobulated right upper lobe pulmonary lesion. Minimal surrounding radiation changes. 2. Enlarging right middle pulmonary lesion worrisome for synchronous cancer. 3. No mediastinal or hilar mass or adenopathy. 4. Stable advanced emphysematous changes and areas of pulmonary scarring. 5. No findings for upper abdominal metastatic disease.  Weight changes, if any:  Wt Readings from Last 3 Encounters:  12/23/15 179 lb (81.194 kg)  12/03/15 175 lb (79.379 kg)  11/25/15 178 lb (80.74 kg)   Respiratory complaints, if any:SOB,coughing up white secretions during the day.  Hemoptysis, if any:No   Swallowing Problems/Pain/Difficulty swallowing:None Smoking Tobacco/Marijuana/Snuff/ETOH ESL:PNPYYF x 53 years still smoking a few cigarettes a day. Skin:Normal Pain : 2/10 left knee Appetite:Good Energy level:Having some fatigue stays in the bed until 9 a.m. Lab work from of chart:No recent labs  Physical Exam:  Filed Vitals:   12/23/15 0751  BP: 131/77  Pulse: 77  Temp: 97.8 F (36.6 C)  TempSrc: Oral  Resp: 18  Height: '5\' 8"'$  (1.727 m)  Weight: 179 lb (81.194 kg)  SpO2: 95%    This is a male in no acute distress. He is alert and oriented. No skin changes noted. Presents in  wheelchair.  IMPRESSION: Mitchell Wood is a 81 y.o. male with stage IIB (T3, N0, M0) squamous cell lung cancer.   PLAN:  Follow up and CT scan in 6 months.   ------------------------------------------------  Thea Silversmith, MD  This document serves as a record of services personally performed by Thea Silversmith, MD. It was created on her behalf by Derek Mound, a trained medical scribe. The creation of this record is based on the scribe's personal observations and the provider's statements to them. This document has been checked and approved by the attending provider.

## 2016-01-03 ENCOUNTER — Telehealth: Payer: Self-pay | Admitting: Neurology

## 2016-01-03 MED ORDER — DONEPEZIL HCL 5 MG PO TABS: 5.0000 mg | ORAL_TABLET | Freq: Every day | ORAL | Status: DC

## 2016-01-03 NOTE — Telephone Encounter (Signed)
Patient's wife is calling and states [facility] states they need an authorization from Dr. Jannifer Franklin to fill medication  Donepezil 5 mg 90 supply tablets.  He is completely out of medicine.  Please call.

## 2016-01-03 NOTE — Telephone Encounter (Signed)
Returned call to pt's wife. She reports that pt is tolerating med well. Took last pill yesterday evening. Retailed w/ refills to pharmacy as requested.

## 2016-01-07 ENCOUNTER — Ambulatory Visit: Payer: Medicare Other | Admitting: Neurology

## 2016-01-12 ENCOUNTER — Other Ambulatory Visit: Payer: Self-pay | Admitting: *Deleted

## 2016-01-12 MED ORDER — METOPROLOL TARTRATE 25 MG PO TABS: 25.0000 mg | ORAL_TABLET | Freq: Two times a day (BID) | ORAL | Status: AC

## 2016-01-12 NOTE — Telephone Encounter (Signed)
Rx request sent to pharmacy.  

## 2016-02-14 ENCOUNTER — Ambulatory Visit (INDEPENDENT_AMBULATORY_CARE_PROVIDER_SITE_OTHER): Payer: Medicare Other | Admitting: Neurology

## 2016-02-14 ENCOUNTER — Encounter: Payer: Self-pay | Admitting: Neurology

## 2016-02-14 VITALS — BP 110/63 | HR 97 | Ht 68.0 in | Wt 171.5 lb

## 2016-02-14 DIAGNOSIS — G20 Parkinson's disease: Secondary | ICD-10-CM | POA: Diagnosis not present

## 2016-02-14 DIAGNOSIS — R269 Unspecified abnormalities of gait and mobility: Secondary | ICD-10-CM | POA: Diagnosis not present

## 2016-02-14 MED ORDER — DONEPEZIL HCL 10 MG PO TABS: 10.0000 mg | ORAL_TABLET | Freq: Every day | ORAL | Status: DC

## 2016-02-14 MED ORDER — CARBIDOPA-LEVODOPA 25-100 MG PO TABS: 1.5000 | ORAL_TABLET | Freq: Three times a day (TID) | ORAL | Status: DC

## 2016-02-14 NOTE — Patient Instructions (Addendum)
We will go up on the Aricept dose from 5 mg to a 10 mg tablet at night. With the Sinemet (carbidopa) taking the 25/100 g tablet, start taking 1.5 tablets 3 times daily. Please call with if any side effects have been noted such as drowsiness or confusion.  Fall Prevention in the Home  Falls can cause injuries and can affect people from all age groups. There are many simple things that you can do to make your home safe and to help prevent falls. WHAT CAN I DO ON THE OUTSIDE OF MY HOME?  Regularly repair the edges of walkways and driveways and fix any cracks.  Remove high doorway thresholds.  Trim any shrubbery on the main path into your home.  Use bright outdoor lighting.  Clear walkways of debris and clutter, including tools and rocks.  Regularly check that handrails are securely fastened and in good repair. Both sides of any steps should have handrails.  Install guardrails along the edges of any raised decks or porches.  Have leaves, snow, and ice cleared regularly.  Use sand or salt on walkways during winter months.  In the garage, clean up any spills right away, including grease or oil spills. WHAT CAN I DO IN THE BATHROOM?  Use night lights.  Install grab bars by the toilet and in the tub and shower. Do not use towel bars as grab bars.  Use non-skid mats or decals on the floor of the tub or shower.  If you need to sit down while you are in the shower, use a plastic, non-slip stool.Marland Kitchen  Keep the floor dry. Immediately clean up any water that spills on the floor.  Remove soap buildup in the tub or shower on a regular basis.  Attach bath mats securely with double-sided non-slip rug tape.  Remove throw rugs and other tripping hazards from the floor. WHAT CAN I DO IN THE BEDROOM?  Use night lights.  Make sure that a bedside light is easy to reach.  Do not use oversized bedding that drapes onto the floor.  Have a firm chair that has side arms to use for getting  dressed.  Remove throw rugs and other tripping hazards from the floor. WHAT CAN I DO IN THE KITCHEN?   Clean up any spills right away.  Avoid walking on wet floors.  Place frequently used items in easy-to-reach places.  If you need to reach for something above you, use a sturdy step stool that has a grab bar.  Keep electrical cables out of the way.  Do not use floor polish or wax that makes floors slippery. If you have to use wax, make sure that it is non-skid floor wax.  Remove throw rugs and other tripping hazards from the floor. WHAT CAN I DO IN THE STAIRWAYS?  Do not leave any items on the stairs.  Make sure that there are handrails on both sides of the stairs. Fix handrails that are broken or loose. Make sure that handrails are as long as the stairways.  Check any carpeting to make sure that it is firmly attached to the stairs. Fix any carpet that is loose or worn.  Avoid having throw rugs at the top or bottom of stairways, or secure the rugs with carpet tape to prevent them from moving.  Make sure that you have a light switch at the top of the stairs and the bottom of the stairs. If you do not have them, have them installed. WHAT ARE SOME  OTHER FALL PREVENTION TIPS?  Wear closed-toe shoes that fit well and support your feet. Wear shoes that have rubber soles or low heels.  When you use a stepladder, make sure that it is completely opened and that the sides are firmly locked. Have someone hold the ladder while you are using it. Do not climb a closed stepladder.  Add color or contrast paint or tape to grab bars and handrails in your home. Place contrasting color strips on the first and last steps.  Use mobility aids as needed, such as canes, walkers, scooters, and crutches.  Turn on lights if it is dark. Replace any light bulbs that burn out.  Set up furniture so that there are clear paths. Keep the furniture in the same spot.  Fix any uneven floor surfaces.  Choose a  carpet design that does not hide the edge of steps of a stairway.  Be aware of any and all pets.  Review your medicines with your healthcare provider. Some medicines can cause dizziness or changes in blood pressure, which increase your risk of falling. Talk with your health care provider about other ways that you can decrease your risk of falls. This may include working with a physical therapist or trainer to improve your strength, balance, and endurance.   This information is not intended to replace advice given to you by your health care provider. Make sure you discuss any questions you have with your health care provider.   Document Released: 08/04/2002 Document Revised: 12/29/2014 Document Reviewed: 09/18/2014 Elsevier Interactive Patient Education [employer].

## 2016-02-14 NOTE — Progress Notes (Signed)
Reason for visit: Parkinson's disease  Mitchell Wood is an 81 y.o. male  History of present illness:  [name] is a 81 year old right-handed black male with a history of Parkinson's disease associated with a gait disorder and a memory disorder. The patient has tried to remain active since last seen, he indicates that he works out at [employer] at least every other day. The patient denies any falls, he uses a cane for ambulation. He still has issues with freezing when he tries to initiate walking or with turns. He does have some troubles with drooling, he denies any issues with swallowing. The wife indicates that there have been no significant changes in his memory or mentation since last seen. The patient in general is sleeping well, he is not having excessive drowsiness or hallucinations on the medication. He is now on Aricept taking 5 mg at night, he takes Sinemet 25/100 mg tablets 1 tablet 3 times daily.  Past Medical History  Diagnosis Date  . Anxiety   . ED (erectile dysfunction)   . ETOH abuse     liver disease  . Hematospermia   . Ventral hernia   . COPD (chronic obstructive pulmonary disease) ([facility])     smoker  . Vitamin D deficiency   . Tobacco abuse   . Coronary artery disease 10/14/12    50-60% LAD  . Moderate aortic insufficiency 10/11/11    NL LVF  . Glaucoma   . PVD (peripheral vascular disease) ([place]) 10/14/12    dilated Ao root at cath  . Atrial fibrillation ([facility])     [facility], 03/31/2009 - No ECG changes, EKG negative for ischemia, clinical correlation recommeneded, mild anteroapical ischemia  . Chronic atrial fibrillation ([facility])     not on Coumadin secondary to GI bleed  . RBBB   . Aortic valve disorder     2D ECHO, 11/18/2012 - EF 50-55%, normal  . Abnormality of gait 06/10/2015  . Memory change 06/10/2015  . Parkinsonism ([place]) 06/10/2015  . Prostate cancer ([place])     chemo/radiation 2000  . Lung cancer ([place])     early stage    Past Surgical History  Procedure  Laterality Date  . Ventral hernia repair    . Cardiac catheterization  10/14/2012    Tubular moderate lesion in the mid LAD, proceed with FFR interrogation to determine physiologic significance  . Left heart catheterization with coronary angiogram N/A 10/14/2012    Procedure: LEFT HEART CATHETERIZATION WITH CORONARY ANGIOGRAM;  Surgeon: Lorretta Harp, MD;  Location: [facility] CATH LAB;  Service: Cardiovascular;  Laterality: N/A;    Family History  Problem Relation Age of Onset  . Emphysema Brother   . Cancer Brother     prostate  . Allergies      whole family  . Diabetes Sister   . Cancer Sister   . Diabetes Child   . Diabetes Child   . Diabetes Child   . Cancer Brother     prostate  . Cancer Brother     prostate  . Cancer Brother     colon  . Cancer Brother     colon    Social history:  reports that he has been smoking Cigarettes.  He has a 50 pack-year smoking history. He has never used smokeless tobacco. He reports that he does not drink alcohol or use illicit drugs.   No Known Allergies  Medications:  Prior to Admission medications   Medication Sig Start Date End Date  Taking? Authorizing Provider  acetaminophen (TYLENOL) 325 MG tablet Take 2 tablets (650 mg total) by mouth every 4 (four) hours as needed. 10/15/12  Yes Erlene Quan, PA-C  aspirin EC 81 MG tablet Take 81 mg by mouth daily.   Yes Historical Provider, MD  budesonide-formoterol (SYMBICORT) 160-4.5 MCG/ACT inhaler Inhale 2 puffs into the lungs 2 (two) times daily. 09/23/12  Yes Brand Males, MD  carbidopa-levodopa (SINEMET IR) 25-100 MG tablet Take 1 tablet by mouth 3 (three) times daily. 09/13/15  Yes Kathrynn Ducking, MD  docusate sodium 100 MG CAPS Take 100 mg by mouth 2 (two) times daily. Patient taking differently: Take 100 mg by mouth daily.  07/13/14  Yes Donne Hazel, MD  donepezil (ARICEPT) 5 MG tablet Take 1 tablet (5 mg total) by mouth at bedtime. 01/03/16  Yes Kathrynn Ducking, MD  ergocalciferol  (VITAMIN D2) 50000 UNITS capsule Take 50,000 Units by mouth 2 (two) times a week.   Yes Historical Provider, MD  ferrous sulfate 325 (65 FE) MG tablet Take 1 tablet (325 mg total) by mouth daily with breakfast. 07/13/14  Yes Donne Hazel, MD  isosorbide mononitrate (IMDUR) 30 MG 24 hr tablet TAKE 1/2 TABLET BY MOUTH DAILY 09/03/15  Yes Lorretta Harp, MD  latanoprost (XALATAN) 0.005 % ophthalmic solution Place 1 drop into both eyes at bedtime. 09/27/14  Yes Historical Provider, MD  metoprolol tartrate (LOPRESSOR) 25 MG tablet Take 1 tablet (25 mg total) by mouth 2 (two) times daily. 01/12/16  Yes Lorretta Harp, MD  nitroGLYCERIN (NITROSTAT) 0.4 MG SL tablet Place 0.4 mg under the tongue every 5 (five) minutes as needed for chest pain. Reported on 11/25/2015   Yes Historical Provider, MD  sodium chloride (OCEAN) 0.65 % SOLN nasal spray Place 1 spray into both nostrils 3 (three) times daily as needed (For nosebleeds.).    Yes Historical Provider, MD    ROS:  Out of a complete 14 system review of symptoms, the patient complains only of the following symptoms, and all other reviewed systems are negative.  Appetite change Drooling Cough, wheezing, shortness of breath Daytime sleepiness Environmental allergies Incontinence of the bladder, frequency of urination Joint pain, joint swelling, walking difficulty Bleeding easily, anemia  Blood pressure 110/63, pulse 97, height '5\' 8"'$  (1.727 m), weight 171 lb 8 oz (77.792 kg).  Physical Exam  General: The patient is alert and cooperative at the time of the examination. Masking of the face is noted.  Skin: No significant peripheral edema is noted.   Neurologic Exam  Mental status: The patient is alert and oriented x 2 at the time of the examination (not oriented to date). The Mini-Mental status examination done today shows a total score 22/30.   Cranial nerves: Facial symmetry is present. Speech is hypophonic, dysarthric, not aphasic.  Extraocular movements are full. Visual fields are full.  Motor: The patient has good strength in all 4 extremities. The patient has some decreased range of movement with elevation of the left arm at the shoulder.  Sensory examination: Soft touch sensation is symmetric on the face, arms, and legs.  Coordination: The patient has good finger-nose-finger and heel-to-shin bilaterally.  Gait and station: The patient has difficulty arising from a seated position with the arms crossed. Once up, he is able to ambulate independently, and usually uses a cane for ambulation. The patient has shuffling steps, some freezing with turns. Romberg is negative.  Reflexes: Deep tendon reflexes are symmetric.   Assessment/Plan:  1. Parkinson's disease  2. Memory disorder  3. Gait disorder  4. Drooling  The patient continues to remain active, he tries to exercise on a regular basis. He will be increased on Sinemet taking 1.5 tablets 3 times daily. The Aricept will be increased taking 10 mg at night. He will follow-up in 4 months, sooner if needed. They will contact me if he is not tolerating the dosing increase.  Jill Alexanders MD 02/14/2016 6:52 PM  [facility] [address] [place], [place] [address]  Phone [phone] Fax [phone]

## 2016-03-15 ENCOUNTER — Other Ambulatory Visit: Payer: Medicare Other

## 2016-03-22 ENCOUNTER — Ambulatory Visit: Payer: Medicare Other | Admitting: Internal Medicine

## 2016-05-31 ENCOUNTER — Ambulatory Visit (HOSPITAL_COMMUNITY)
Admission: RE | Admit: 2016-05-31 | Discharge: 2016-05-31 | Disposition: A | Discharge status: Home / Self Care | Payer: Medicare Other | Source: Ambulatory Visit | Attending: Radiation Oncology | Admitting: Radiation Oncology

## 2016-05-31 ENCOUNTER — Encounter (HOSPITAL_COMMUNITY): Payer: Self-pay

## 2016-05-31 DIAGNOSIS — Z923 Personal history of irradiation: Secondary | ICD-10-CM | POA: Diagnosis not present

## 2016-05-31 DIAGNOSIS — C3491 Malignant neoplasm of unspecified part of right bronchus or lung: Secondary | ICD-10-CM | POA: Diagnosis present

## 2016-05-31 HISTORY — DX: Personal history of irradiation: Z92.3

## 2016-06-01 ENCOUNTER — Ambulatory Visit
Admission: RE | Admit: 2016-06-01 | Discharge: 2016-06-01 | Disposition: A | Discharge status: Home / Self Care | Payer: Medicare Other | Source: Ambulatory Visit | Attending: Radiation Oncology | Admitting: Radiation Oncology

## 2016-06-01 ENCOUNTER — Telehealth: Payer: Self-pay | Admitting: Oncology

## 2016-06-01 ENCOUNTER — Encounter: Payer: Self-pay | Admitting: Radiation Oncology

## 2016-06-01 DIAGNOSIS — Z79899 Other long term (current) drug therapy: Secondary | ICD-10-CM | POA: Diagnosis not present

## 2016-06-01 DIAGNOSIS — C3491 Malignant neoplasm of unspecified part of right bronchus or lung: Secondary | ICD-10-CM | POA: Diagnosis not present

## 2016-06-01 DIAGNOSIS — R0602 Shortness of breath: Secondary | ICD-10-CM | POA: Diagnosis not present

## 2016-06-01 DIAGNOSIS — Z923 Personal history of irradiation: Secondary | ICD-10-CM | POA: Insufficient documentation

## 2016-06-01 DIAGNOSIS — Z7982 Long term (current) use of aspirin: Secondary | ICD-10-CM | POA: Diagnosis not present

## 2016-06-01 NOTE — Telephone Encounter (Signed)
Called patient and asked if he could come in today at 4:00 instead of 4:50 as there has been a cancellation. Mitchell Wood said that would be OK and will come in at 4 pm.

## 2016-06-01 NOTE — Progress Notes (Signed)
[name] here for  Follow up after treatment to his right upper lobe.  He denies having pain.  He reports having an occasional productive cough with white sputum.  He denies having hemoptysis.  He reports having occasional shortness of breath.  He reports having a poor energy level.  BP 106/61 (BP Location: Right Arm, Patient Position: Sitting)   Pulse 77   Temp 98.2 F (36.8 C) (Oral)   Ht '5\' 8"'$  (1.727 m)   Wt 166 lb 12.8 oz (75.7 kg)   SpO2 99%   BMI 25.36 kg/m    Wt Readings from Last 3 Encounters:  06/01/16 166 lb 12.8 oz (75.7 kg)  02/14/16 171 lb 8 oz (77.8 kg)  12/23/15 179 lb (81.2 kg)

## 2016-06-01 NOTE — Progress Notes (Signed)
Radiation Oncology         [phone] ________________________________  Name: Mitchell Wood MRN: [id number]  Date: 06/01/2016  DOB: 15-Nov-1934  Follow-Up Visit Note  CC: Maximino Greenland, MD  Glendale Chard, MD    ICD-9-CM ICD-10-CM   1. Squamous cell carcinoma of right lung (HCC) 162.9 C34.91     Diagnosis:  Stage IIB (T3, N0, M0) squamous cell lung cancer  Interval Since Last Radiation:  7 months, 2 weeks SBRT Treatment: 10/12/15, 10/14/15, 10/18/15, 10/20/15, 10/22/15, Treated by Dr. Pablo Ledger  Narrative:  The patient returns today for routine follow-up after treatment to his right upper lobe. He denies having pain. He reports having an occasional productive cough with white sputum. He denies having hemoptysis. He reports having shortness of breath. He reports having a poor energy level. Patient reports he has lost some weight in the last month.                       ALLERGIES:  has No Known Allergies.  Meds: Current Outpatient Prescriptions  Medication Sig Dispense Refill  . acetaminophen (TYLENOL) 325 MG tablet Take 2 tablets (650 mg total) by mouth every 4 (four) hours as needed.    Marland Kitchen aspirin EC 81 MG tablet Take 81 mg by mouth daily.    . budesonide-formoterol (SYMBICORT) 160-4.5 MCG/ACT inhaler Inhale 2 puffs into the lungs 2 (two) times daily. 1 Inhaler 6  . carbidopa-levodopa (SINEMET IR) 25-100 MG tablet Take 1.5 tablets by mouth 3 (three) times daily. 135 tablet 4  . docusate sodium 100 MG CAPS Take 100 mg by mouth 2 (two) times daily. (Patient taking differently: Take 100 mg by mouth daily. ) 10 capsule 0  . donepezil (ARICEPT) 10 MG tablet Take 1 tablet (10 mg total) by mouth at bedtime. 30 tablet 5  . ergocalciferol (VITAMIN D2) 50000 UNITS capsule Take 50,000 Units by mouth 2 (two) times a week.    . ferrous sulfate 325 (65 FE) MG tablet Take 1 tablet (325 mg total) by mouth daily with breakfast. 30 tablet 0  . isosorbide mononitrate (IMDUR) 30 MG 24 hr tablet TAKE 1/2  TABLET BY MOUTH DAILY 45 tablet 3  . metoprolol tartrate (LOPRESSOR) 25 MG tablet Take 1 tablet (25 mg total) by mouth 2 (two) times daily. 180 tablet 3  . latanoprost (XALATAN) 0.005 % ophthalmic solution Place 1 drop into both eyes at bedtime.  6  . nitroGLYCERIN (NITROSTAT) 0.4 MG SL tablet Place 0.4 mg under the tongue every 5 (five) minutes as needed for chest pain. Reported on 11/25/2015    . sodium chloride (OCEAN) 0.65 % SOLN nasal spray Place 1 spray into both nostrils 3 (three) times daily as needed (For nosebleeds.).      No current facility-administered medications for this encounter.     Physical Findings: The patient is in no acute distress. Patient is alert and oriented.  height is '5\' 8"'$  (1.727 m) and weight is 166 lb 12.8 oz (75.7 kg). His oral temperature is 98.2 F (36.8 C). His blood pressure is 106/61 and his pulse is 77. His oxygen saturation is 99%. .  No significant changes. Lungs are clear to auscultation bilaterally. Heart has regular rate and rhythm. No palpable cervical, supraclavicular, or axillary adenopathy. Abdomen soft, non-tender, normal bowel sounds.   Lab Findings: Lab Results  Component Value Date   WBC 6.8 09/23/2015   HGB 13.2 09/23/2015   HCT 39.4 09/23/2015   MCV 84.2 09/23/2015  PLT 183 09/23/2015    Radiographic Findings: Ct Chest Wo Contrast  Result Date: 05/31/2016 CLINICAL DATA:  Lung cancer diagnosed 2017. Radiation therapy complete. EXAM: CT CHEST WITHOUT CONTRAST TECHNIQUE: Multidetector CT imaging of the chest was performed following the standard protocol without IV contrast. COMPARISON:  CT 12/09/2015, PET-CT 07/28/2015 FINDINGS: Cardiovascular: Coronary artery calcification and aortic atherosclerotic calcification. Mediastinum/Nodes: No axillary supraclavicular adenopathy. No mediastinal hilar adenopathy. No pericardial fluid. Esophagus normal. Lungs/Pleura: RIGHT upper lobe nodules measured 2.3 x 0.8 cm compared to 3.0 x 1.4 cm for some  interval contraction. This is site of previous hypermetabolic nodules and subsequent radiation therapy. New nodular focus anterior to the the treatment site measures 1.5 cm (image 51, series 5). Within the RIGHT middle lobe continued increase in size of nodule measuring 2.9 x 2.1 cm increased from 1.5 x 1.5 cm (image 94, series 5). LEFT lung is clear. Upper Abdomen: Adrenal glands are normal. No focal hepatic lesions noncontrast exam. Sludge within the gallbladder. Musculoskeletal: No aggressive osseous lesion. IMPRESSION: 1. Continued mild contraction of the previously hypermetabolic RIGHT upper lobe nodules following radiation therapy. 2. New nodular focus anterior to the treatment site favored to represent post treatment lung parenchymal changes. Recommend attention on follow-up. 3. Continued increased size of RIGHT middle lobe pulmonary nodule most concerning for SYNCHRONOUS BRONCHOGENIC CARCINOMA. 4. No evidence of mediastinal adenopathy. Electronically Signed   By: Suzy Bouchard M.D.   On: 05/31/2016 15:54    Impression:  Stage IIB (T3, N0, M0) squamous cell lung cancer. No evidence of recurrence on clinical exam. However, the patient's chest CT shows an enlarging right middle lobe nodule. This is concerning for malignancy. The lesion treated with SBRT is better.  Plan: I will Present the patient's case at weekly thoracic conference concerning enlarging right middle lobe nodule to see what would be the best way to biopsy this area.  ____________________________________  This document serves as a record of services personally performed by Gery Pray, MD. It was created on his behalf by Bethann Humble, a trained medical scribe. The creation of this record is based on the scribe's personal observations and the provider's statements to them. This document has been checked and approved by the attending provider.

## 2016-06-08 ENCOUNTER — Other Ambulatory Visit (HOSPITAL_COMMUNITY)
Admission: RE | Admit: 2016-06-08 | Discharge: 2016-06-08 | Disposition: A | Discharge status: Home / Self Care | Payer: Medicare Other | Source: Ambulatory Visit | Attending: Internal Medicine | Admitting: Internal Medicine

## 2016-06-08 ENCOUNTER — Encounter: Payer: Self-pay | Admitting: *Deleted

## 2016-06-08 DIAGNOSIS — C3411 Malignant neoplasm of upper lobe, right bronchus or lung: Secondary | ICD-10-CM | POA: Insufficient documentation

## 2016-06-08 NOTE — Progress Notes (Signed)
Oncology Nurse Navigator Documentation  Oncology Nurse Navigator Flowsheets 06/08/2016  Navigator Location [facility]  Navigator Encounter Type Other/per Dr. Sondra Come, he would like Dr. Julien Nordmann to see patient.  I notified scheduling to set up appt on 07/03/16  Barriers/Navigation Needs Coordination of Care  Interventions Coordination of Care  Coordination of Care Appts  Acuity Level 2  Acuity Level 2 Assistance expediting appointments  Time Spent with Patient 30

## 2016-06-15 ENCOUNTER — Ambulatory Visit (INDEPENDENT_AMBULATORY_CARE_PROVIDER_SITE_OTHER): Payer: Medicare Other | Admitting: Neurology

## 2016-06-15 ENCOUNTER — Encounter: Payer: Self-pay | Admitting: Neurology

## 2016-06-15 VITALS — BP 149/78 | HR 96 | Resp 20 | Ht 69.0 in | Wt 169.0 lb

## 2016-06-15 DIAGNOSIS — R269 Unspecified abnormalities of gait and mobility: Secondary | ICD-10-CM

## 2016-06-15 DIAGNOSIS — R413 Other amnesia: Secondary | ICD-10-CM | POA: Diagnosis not present

## 2016-06-15 DIAGNOSIS — G20 Parkinson's disease: Secondary | ICD-10-CM

## 2016-06-15 MED ORDER — CARBIDOPA-LEVODOPA ER 50-200 MG PO TBCR: 1.0000 | EXTENDED_RELEASE_TABLET | Freq: Three times a day (TID) | ORAL | 2 refills | Status: AC

## 2016-06-15 NOTE — Patient Instructions (Addendum)
Sinemet (carbidopa) may result in confusion or hallucinations, drowsiness, nausea, or dizziness. If any significant side effects are noted, please contact our office. Sinemet may not be well absorbed when taken with high protein meals, if tolerated it is best to take 30-45 minutes before you eat.  Fall Prevention in the Home  Falls can cause injuries and can affect people from all age groups. There are many simple things that you can do to make your home safe and to help prevent falls. WHAT CAN I DO ON THE OUTSIDE OF MY HOME?  Regularly repair the edges of walkways and driveways and fix any cracks.  Remove high doorway thresholds.  Trim any shrubbery on the main path into your home.  Use bright outdoor lighting.  Clear walkways of debris and clutter, including tools and rocks.  Regularly check that handrails are securely fastened and in good repair. Both sides of any steps should have handrails.  Install guardrails along the edges of any raised decks or porches.  Have leaves, snow, and ice cleared regularly.  Use sand or salt on walkways during winter months.  In the garage, clean up any spills right away, including grease or oil spills. WHAT CAN I DO IN THE BATHROOM?  Use night lights.  Install grab bars by the toilet and in the tub and shower. Do not use towel bars as grab bars.  Use non-skid mats or decals on the floor of the tub or shower.  If you need to sit down while you are in the shower, use a plastic, non-slip stool.Marland Kitchen  Keep the floor dry. Immediately clean up any water that spills on the floor.  Remove soap buildup in the tub or shower on a regular basis.  Attach bath mats securely with double-sided non-slip rug tape.  Remove throw rugs and other tripping hazards from the floor. WHAT CAN I DO IN THE BEDROOM?  Use night lights.  Make sure that a bedside light is easy to reach.  Do not use oversized bedding that drapes onto the floor.  Have a firm chair  that has side arms to use for getting dressed.  Remove throw rugs and other tripping hazards from the floor. WHAT CAN I DO IN THE KITCHEN?   Clean up any spills right away.  Avoid walking on wet floors.  Place frequently used items in easy-to-reach places.  If you need to reach for something above you, use a sturdy step stool that has a grab bar.  Keep electrical cables out of the way.  Do not use floor polish or wax that makes floors slippery. If you have to use wax, make sure that it is non-skid floor wax.  Remove throw rugs and other tripping hazards from the floor. WHAT CAN I DO IN THE STAIRWAYS?  Do not leave any items on the stairs.  Make sure that there are handrails on both sides of the stairs. Fix handrails that are broken or loose. Make sure that handrails are as long as the stairways.  Check any carpeting to make sure that it is firmly attached to the stairs. Fix any carpet that is loose or worn.  Avoid having throw rugs at the top or bottom of stairways, or secure the rugs with carpet tape to prevent them from moving.  Make sure that you have a light switch at the top of the stairs and the bottom of the stairs. If you do not have them, have them installed. WHAT ARE SOME OTHER FALL  PREVENTION TIPS?  Wear closed-toe shoes that fit well and support your feet. Wear shoes that have rubber soles or low heels.  When you use a stepladder, make sure that it is completely opened and that the sides are firmly locked. Have someone hold the ladder while you are using it. Do not climb a closed stepladder.  Add color or contrast paint or tape to grab bars and handrails in your home. Place contrasting color strips on the first and last steps.  Use mobility aids as needed, such as canes, walkers, scooters, and crutches.  Turn on lights if it is dark. Replace any light bulbs that burn out.  Set up furniture so that there are clear paths. Keep the furniture in the same spot.  Fix  any uneven floor surfaces.  Choose a carpet design that does not hide the edge of steps of a stairway.  Be aware of any and all pets.  Review your medicines with your healthcare provider. Some medicines can cause dizziness or changes in blood pressure, which increase your risk of falling. Talk with your health care provider about other ways that you can decrease your risk of falls. This may include working with a physical therapist or trainer to improve your strength, balance, and endurance.   This information is not intended to replace advice given to you by your health care provider. Make sure you discuss any questions you have with your health care provider.   Document Released: 08/04/2002 Document Revised: 12/29/2014 Document Reviewed: 09/18/2014 Elsevier Interactive Patient Education [employer].

## 2016-06-15 NOTE — Progress Notes (Signed)
Reason for visit: Parkinson's disease  Mitchell Wood is an 81 y.o. male  History of present illness:  Mitchell Wood is a 81 year old right-handed black male with a history of Parkinson's disease. The patient has a history of lung cancer, he is getting radiation therapy for this. The patient is on Sinemet taking the 25/100 mg tablets, 1.5 tablets 3 times daily. The patient has some drowsiness and fatigue. The patient has a memory disorder and he is on Aricept for this. The patient is tolerating the Aricept fairly well, he has lost about 2 pounds since last seen. The patient has been walking with a cane, he has not had any falls, he will go to the gym to exercise on a regular basis. He indicates that he is sleeping fairly well, the wife indicates that there has not been much change in memory since last seen. The patient does require some reminders to take his medications on time. He is not choking with swallowing. The patient has had difficulty with turns, he is able to turn to the right better than when he turns to the left. He will freeze with turns. He reports some electric shock pains in the right knee when he tries to bear weight, he does not have pain with sitting or lying down. He denies back pain or pain down the leg. He returns for an evaluation.  Past Medical History:  Diagnosis Date  . Abnormality of gait 06/10/2015  . Anxiety   . Aortic valve disorder    2D ECHO, 11/18/2012 - EF 50-55%, normal  . Atrial fibrillation (HCC)    NUC, 03/31/2009 - No ECG changes, EKG negative for ischemia, clinical correlation recommeneded, mild anteroapical ischemia  . Chronic atrial fibrillation (HCC)    not on Coumadin secondary to GI bleed  . COPD (chronic obstructive pulmonary disease) ([place])    smoker  . Coronary artery disease 10/14/12   50-60% LAD  . ED (erectile dysfunction)   . ETOH abuse    liver disease  . Glaucoma   . Hematospermia   . History of radiation therapy 10/12/15-10/22/15   right upper  lobe 60 Gy  . Lung cancer ([place])    early stage  . Memory change 06/10/2015  . Moderate aortic insufficiency 10/11/11   NL LVF  . Parkinsonism ([place]) 06/10/2015  . Prostate cancer ([place])    chemo/radiation 2000  . PVD (peripheral vascular disease) ([place]) 10/14/12   dilated Ao root at cath  . RBBB   . Tobacco abuse   . Ventral hernia   . Vitamin D deficiency     Past Surgical History:  Procedure Laterality Date  . CARDIAC CATHETERIZATION  10/14/2012   Tubular moderate lesion in the mid LAD, proceed with FFR interrogation to determine physiologic significance  . LEFT HEART CATHETERIZATION WITH CORONARY ANGIOGRAM N/A 10/14/2012   Procedure: LEFT HEART CATHETERIZATION WITH CORONARY ANGIOGRAM;  Surgeon: Lorretta Harp, MD;  Location[facility] C[facility] LAB;  Service: Cardiovascular;  Laterality: N/A;  . VENTRAL HERNIA REPAIR      Family History  Problem Relation Age of Onset  . Emphysema Brother   . Cancer Brother     prostate  . Allergies      whole family  . Diabetes Sister   . Cancer Sister   . Diabetes Child   . Diabetes Child   . Diabetes Child   . Cancer Brother     prostate  . Cancer Brother     prostate  .  Cancer Brother     colon  . Cancer Brother     colon    Social history:  reports that he has been smoking Cigarettes.  He has a 50.00 pack-year smoking history. He has never used smokeless tobacco. He reports that he does not drink alcohol or use drugs.   No Known Allergies  Medications:  Prior to Admission medications   Medication Sig Start Date End Date Taking? Authorizing Provider  acetaminophen (TYLENOL) 325 MG tablet Take 2 tablets (650 mg total) by mouth every 4 (four) hours as needed. 10/15/12   Erlene Quan, PA-C  aspirin EC 81 MG tablet Take 81 mg by mouth daily.    Historical Provider, MD  budesonide-formoterol (SYMBICORT) 160-4.5 MCG/ACT inhaler Inhale 2 puffs into the lungs 2 (two) times daily. 09/23/12   Brand Males, MD  carbidopa-levodopa (SINEMET IR)  25-100 MG tablet Take 1.5 tablets by mouth 3 (three) times daily. 02/14/16   Kathrynn Ducking, MD  docusate sodium 100 MG CAPS Take 100 mg by mouth 2 (two) times daily. Patient taking differently: Take 100 mg by mouth daily.  07/13/14   Donne Hazel, MD  donepezil (ARICEPT) 10 MG tablet Take 1 tablet (10 mg total) by mouth at bedtime. 02/14/16   Kathrynn Ducking, MD  ergocalciferol (VITAMIN D2) 50000 UNITS capsule Take 50,000 Units by mouth 2 (two) times a week.    Historical Provider, MD  ferrous sulfate 325 (65 FE) MG tablet Take 1 tablet (325 mg total) by mouth daily with breakfast. 07/13/14   Donne Hazel, MD  isosorbide mononitrate (IMDUR) 30 MG 24 hr tablet TAKE 1/2 TABLET BY MOUTH DAILY 09/03/15   Lorretta Harp, MD  latanoprost (XALATAN) 0.005 % ophthalmic solution Place 1 drop into both eyes at bedtime. 09/27/14   Historical Provider, MD  metoprolol tartrate (LOPRESSOR) 25 MG tablet Take 1 tablet (25 mg total) by mouth 2 (two) times daily. 01/12/16   Lorretta Harp, MD  nitroGLYCERIN (NITROSTAT) 0.4 MG SL tablet Place 0.4 mg under the tongue every 5 (five) minutes as needed for chest pain. Reported on 11/25/2015    Historical Provider, MD  sodium chloride (OCEAN) 0.65 % SOLN nasal spray Place 1 spray into both nostrils 3 (three) times daily as needed (For nosebleeds.).     Historical Provider, MD    ROS:  Out of a complete 14 system review of symptoms, the patient complains only of the following symptoms, and all other reviewed systems are negative.  Drooling Cough, wheezing, shortness of breath Urinary urgency Joint pain, walking difficulty Speech difficulty  Blood pressure (!) 149/78, pulse 96, resp. rate 20, height '5\' 9"'$  (1.753 m), weight 169 lb (76.7 kg).  Physical Exam  General: The patient is alert and cooperative at the time of the examination.  Skin: No significant peripheral edema is noted.   Neurologic Exam  Mental status: The patient is alert and oriented x 3 at  the time of the examination. The patient has apparent normal recent and remote memory, with an apparently normal attention span and concentration ability.   Cranial nerves: Facial symmetry is present. Speech is slightly dysphonic, not aphasic. The patient will stutter at times. Extraocular movements are full, with superior gaze there is exotropia of the right eye. Visual fields are full. Masking of the face is seen.  Motor: The patient has good strength in all 4 extremities.  Sensory examination: Soft touch sensation is symmetric on the face, arms, and legs.  Coordination: The patient has good finger-nose-finger and heel-to-shin bilaterally.  Gait and station: The patient has difficulty arising from a seated position with arms crossed. He is able to stand by pushing off of his arms, once up, the has some freezing with turns, he is able to ambulate without assistance, he usually uses a cane. The patient has decreased arm swing on the left relative to the right. Romberg is negative.  Reflexes: Deep tendon reflexes are symmetric, but are depressed.   Assessment/Plan:  1. Parkinson's disease  2. Gait disorder  3. Memory disorder  The patient is reporting some problems with drooling that may be related to the Parkinson's disease and the concurrent use of Aricept. The patient will remain on Aricept 10 mg at night. The Sinemet will be increased taking the Sinemet CR tablets 50/200 mg, one tablet 3 times daily. The patient will continue his routine activity, he has to be careful with walking. The right knee pain likely is arthritic in nature, associated with weightbearing. He will follow-up in 4 months.  Jill Alexanders MD 06/15/2016 7:41 AM  [facility] [address] [place], [place] [address]  Phone [phone] Fax [phone]

## 2016-06-20 ENCOUNTER — Encounter (HOSPITAL_COMMUNITY): Payer: Self-pay

## 2016-06-22 ENCOUNTER — Encounter (HOSPITAL_COMMUNITY): Payer: Self-pay

## 2016-07-03 ENCOUNTER — Ambulatory Visit (HOSPITAL_BASED_OUTPATIENT_CLINIC_OR_DEPARTMENT_OTHER): Payer: Medicare Other | Admitting: Internal Medicine

## 2016-07-03 ENCOUNTER — Other Ambulatory Visit (HOSPITAL_BASED_OUTPATIENT_CLINIC_OR_DEPARTMENT_OTHER): Payer: Medicare Other

## 2016-07-03 ENCOUNTER — Encounter: Payer: Self-pay | Admitting: Internal Medicine

## 2016-07-03 ENCOUNTER — Telehealth: Payer: Self-pay | Admitting: Internal Medicine

## 2016-07-03 VITALS — BP 81/56 | HR 78 | Temp 98.6°F | Resp 18 | Ht 69.0 in | Wt 165.6 lb

## 2016-07-03 DIAGNOSIS — R05 Cough: Secondary | ICD-10-CM

## 2016-07-03 DIAGNOSIS — J984 Other disorders of lung: Secondary | ICD-10-CM

## 2016-07-03 DIAGNOSIS — I959 Hypotension, unspecified: Secondary | ICD-10-CM

## 2016-07-03 DIAGNOSIS — C3411 Malignant neoplasm of upper lobe, right bronchus or lung: Secondary | ICD-10-CM

## 2016-07-03 DIAGNOSIS — C3490 Malignant neoplasm of unspecified part of unspecified bronchus or lung: Secondary | ICD-10-CM

## 2016-07-03 DIAGNOSIS — R0609 Other forms of dyspnea: Secondary | ICD-10-CM

## 2016-07-03 DIAGNOSIS — I10 Essential (primary) hypertension: Secondary | ICD-10-CM

## 2016-07-03 DIAGNOSIS — I4891 Unspecified atrial fibrillation: Secondary | ICD-10-CM | POA: Diagnosis not present

## 2016-07-03 DIAGNOSIS — C3491 Malignant neoplasm of unspecified part of right bronchus or lung: Secondary | ICD-10-CM

## 2016-07-03 LAB — COMPREHENSIVE METABOLIC PANEL
ALT: <3 U/L (ref 0–55)
ANION GAP: 8 meq/L (ref 3–11)
AST: 18 U/L (ref 5–34)
Albumin: 3.3 g/dL — ABNORMAL LOW (ref 3.5–5.0)
Alkaline Phosphatase: 57 U/L (ref 40–150)
BILIRUBIN TOTAL: 0.51 mg/dL (ref 0.20–1.20)
BUN: 14.5 mg/dL (ref 7.0–26.0)
CALCIUM: 9.4 mg/dL (ref 8.4–10.4)
CHLORIDE: 105 meq/L (ref 98–109)
CO2: 28 meq/L (ref 22–29)
Creatinine: 0.9 mg/dL (ref 0.7–1.3)
Glucose: 97 mg/dl (ref 70–140)
Potassium: 4.4 mEq/L (ref 3.5–5.1)
Sodium: 141 mEq/L (ref 136–145)
Total Protein: 7.7 g/dL (ref 6.4–8.3)

## 2016-07-03 LAB — CBC WITH DIFFERENTIAL/PLATELET
BASO%: 0.3 % (ref 0.0–2.0)
BASOS ABS: 0 10*3/uL (ref 0.0–0.1)
EOS ABS: 0.1 10*3/uL (ref 0.0–0.5)
EOS%: 1.3 % (ref 0.0–7.0)
HCT: 35.2 % — ABNORMAL LOW (ref 38.4–49.9)
HGB: 11.5 g/dL — ABNORMAL LOW (ref 13.0–17.1)
LYMPH%: 16.7 % (ref 14.0–49.0)
MCH: 28 pg (ref 27.2–33.4)
MCHC: 32.7 g/dL (ref 32.0–36.0)
MCV: 85.9 fL (ref 79.3–98.0)
MONO#: 0.5 10*3/uL (ref 0.1–0.9)
MONO%: 7.5 % (ref 0.0–14.0)
NEUT#: 5.4 10*3/uL (ref 1.5–6.5)
NEUT%: 74.2 % (ref 39.0–75.0)
PLATELETS: 201 10*3/uL (ref 140–400)
RBC: 4.1 10*6/uL — AB (ref 4.20–5.82)
RDW: 17.1 % — ABNORMAL HIGH (ref 11.0–14.6)
WBC: 7.2 10*3/uL (ref 4.0–10.3)
lymph#: 1.2 10*3/uL (ref 0.9–3.3)

## 2016-07-03 NOTE — Progress Notes (Signed)
[place] [phone]   [phone]  OFFICE PROGRESS NOTE  Mitchell Greenland, MD [address] [place] [address]  DIAGNOSIS:  Stage IIB (T3, N0, M0) non-small cell lung cancer, squamous cell carcinoma presented with right upper lobe lung nodule in addition to questionable nodules in the right middle lobe diagnosed in December 2016.  PRIOR THERAPY: Status post curative radiotherapy to the right upper lobe lung mass completed 10/22/2015.  CURRENT THERAPY: Observation.  INTERVAL HISTORY: Mitchell Wood 81 y.o. male returns to the clinic today for follow-up visit accompanied by his wife. The patient underwent curative radiotherapy to the right upper lobe lung mass under the care of Dr. Pablo Ledger and tolerated his treatment well. He has been observation since that time. He is not a good candidate for systemic chemotherapy. He continues to have shortness of breath at baseline and increased with exertion. He also has hypotension secondary to treatment for blood pressure and atrial fibrillation with nitroglycerin and metoprolol by his cardiologist. He continues to have mild cough but no hemoptysis. He denied having any chest pain. He denied having any significant weight loss or night sweats. He has repeat CT scan of the chest performed last month and he is here for evaluation and discussion of his scan results.  MEDICAL HISTORY: Past Medical History:  Diagnosis Date  . Abnormality of gait 06/10/2015  . Anxiety   . Aortic valve disorder    2D ECHO, 11/18/2012 - EF 50-55%, normal  . Atrial fibrillation (HCC)    NUC, 03/31/2009 - No ECG changes, EKG negative for ischemia, clinical correlation recommeneded, mild anteroapical ischemia  . Chronic atrial fibrillation (HCC)    not on Coumadin secondary to GI bleed  . COPD (chronic obstructive pulmonary disease) ([place])    smoker  . Coronary artery disease 10/14/12   50-60% LAD  . ED (erectile  dysfunction)   . ETOH abuse    liver disease  . Glaucoma   . Hematospermia   . History of radiation therapy 10/12/15-10/22/15   right upper lobe 60 Gy  . Lung cancer ([place])    early stage  . Memory change 06/10/2015  . Moderate aortic insufficiency 10/11/11   NL LVF  . Parkinsonism ([place]) 06/10/2015  . Prostate cancer ([place])    chemo/radiation 2000  . PVD (peripheral vascular disease) ([place]) 10/14/12   dilated Ao root at cath  . RBBB   . Tobacco abuse   . Ventral hernia   . Vitamin D deficiency     ALLERGIES:  has No Known Allergies.  MEDICATIONS:  Current Outpatient Prescriptions  Medication Sig Dispense Refill  . acetaminophen (TYLENOL) 325 MG tablet Take 2 tablets (650 mg total) by mouth every 4 (four) hours as needed.    Mitchell Wood aspirin EC 81 MG tablet Take 81 mg by mouth daily.    . budesonide-formoterol (SYMBICORT) 160-4.5 MCG/ACT inhaler Inhale 2 puffs into the lungs 2 (two) times daily. 1 Inhaler 6  . carbidopa-levodopa (SINEMET CR) 50-200 MG tablet Take 1 tablet by mouth 3 (three) times daily. 270 tablet 2  . docusate sodium 100 MG CAPS Take 100 mg by mouth 2 (two) times daily. (Patient taking differently: Take 100 mg by mouth daily. ) 10 capsule 0  . donepezil (ARICEPT) 10 MG tablet Take 1 tablet (10 mg total) by mouth at bedtime. 30 tablet 5  . ergocalciferol (VITAMIN D2) 50000 UNITS capsule Take 50,000 Units by mouth 2 (two) times a week.    Mitchell Wood  ferrous sulfate 325 (65 FE) MG tablet Take 1 tablet (325 mg total) by mouth daily with breakfast. 30 tablet 0  . isosorbide mononitrate (IMDUR) 30 MG 24 hr tablet TAKE 1/2 TABLET BY MOUTH DAILY 45 tablet 3  . latanoprost (XALATAN) 0.005 % ophthalmic solution Place 1 drop into both eyes at bedtime.  6  . metoprolol tartrate (LOPRESSOR) 25 MG tablet Take 1 tablet (25 mg total) by mouth 2 (two) times daily. 180 tablet 3  . nitroGLYCERIN (NITROSTAT) 0.4 MG SL tablet Place 0.4 mg under the tongue every 5 (five) minutes as needed for chest pain.  Reported on 11/25/2015    . sodium chloride (OCEAN) 0.65 % SOLN nasal spray Place 1 spray into both nostrils 3 (three) times daily as needed (For nosebleeds.).      No current facility-administered medications for this visit.     SURGICAL HISTORY:  Past Surgical History:  Procedure Laterality Date  . CARDIAC CATHETERIZATION  10/14/2012   Tubular moderate lesion in the mid LAD, proceed with FFR interrogation to determine physiologic significance  . LEFT HEART CATHETERIZATION WITH CORONARY ANGIOGRAM N/A 10/14/2012   Procedure: LEFT HEART CATHETERIZATION WITH CORONARY ANGIOGRAM;  Surgeon: Lorretta Harp, MD;  Location[facility] C[facility] LAB;  Service: Cardiovascular;  Laterality: N/A;  . VENTRAL HERNIA REPAIR      REVIEW OF SYSTEMS:  A comprehensive review of systems was negative except for: Constitutional: positive for fatigue Respiratory: positive for cough and dyspnea on exertion   PHYSICAL EXAMINATION: General appearance: alert, cooperative, fatigued and no distress Head: Normocephalic, without obvious abnormality, atraumatic Neck: no adenopathy, no JVD, supple, symmetrical, trachea midline and thyroid not enlarged, symmetric, no tenderness/mass/nodules Lymph nodes: Cervical, supraclavicular, and axillary nodes normal. Resp: clear to auscultation bilaterally Back: symmetric, no curvature. ROM normal. No CVA tenderness. Cardio: regular rate and rhythm, S1, S2 normal, no murmur, click, rub or gallop GI: soft, non-tender; bowel sounds normal; no masses,  no organomegaly Extremities: extremities normal, atraumatic, no cyanosis or edema  ECOG PERFORMANCE STATUS: 2 - Symptomatic, <50% confined to bed  There were no vitals taken for this visit.  LABORATORY DATA: Lab Results  Component Value Date   WBC 7.2 07/03/2016   HGB 11.5 (L) 07/03/2016   HCT 35.2 (L) 07/03/2016   MCV 85.9 07/03/2016   PLT 201 07/03/2016      Chemistry      Component Value Date/Time   NA 142 09/23/2015 1227   K 4.2  09/23/2015 1227   CL 102 07/02/2015 1422   CO2 30 (H) 09/23/2015 1227   BUN 12.9 09/23/2015 1227   CREATININE 1.0 09/23/2015 1227      Component Value Date/Time   CALCIUM 9.6 09/23/2015 1227   ALKPHOS 63 09/23/2015 1227   AST 21 09/23/2015 1227   ALT <9 09/23/2015 1227   BILITOT 0.50 09/23/2015 1227       RADIOGRAPHIC STUDIES: No results found.  ASSESSMENT AND PLAN: This is a very pleasant 81 years old African-American male with a stage IIb non-small cell lung cancer status post curative radiotherapy to the right upper lobe lung mass in February 2017 and has been observation since that time. The patient is doing fine except for the baseline shortness breath and cough. The recent CT scan of the chest showed improvement in the right upper lobe lung nodule but there was continued increase in the size of the right middle lobe pulmonary nodule concerning for synchronous bronchogenic carcinoma. I discussed the scan results with the patient  and his wife. I recommended for him to see Dr. Sondra Come for consideration of curative radiotherapy to this lesion in the right middle lobe. I will see him back for follow-up visit in 6 months with repeat CT scan of the chest for restaging of his disease. For the hypotension, I advised the patient to hold his current blood pressure medication and to reconsult with his cardiologist for adjustment of his dose. He was advised to call immediately if he has any concerning symptoms in the interval. The patient voices understanding of current disease status and treatment options and is in agreement with the current care plan.  All questions were answered. The patient knows to call the clinic with any problems, questions or concerns. We can certainly see the patient much sooner if necessary.  Disclaimer: This note was dictated with voice recognition software. Similar sounding words can inadvertently be transcribed and may not be corrected upon review.

## 2016-07-03 NOTE — Telephone Encounter (Signed)
Gave patient relative avs report and appointments for May 2018 and appointments with Dr. Sondra Come 11/20.

## 2016-07-12 ENCOUNTER — Other Ambulatory Visit: Payer: Self-pay | Admitting: *Deleted

## 2016-07-12 MED ORDER — ISOSORBIDE MONONITRATE ER 30 MG PO TB24: 15.0000 mg | ORAL_TABLET | Freq: Every day | ORAL | 1 refills | Status: AC

## 2016-07-14 NOTE — Progress Notes (Signed)
Thoracic Location of Tumor / Histology: right middle lobe pulmonary nodule   Patient presented for a CT scan on 05/31/16 which showed "ontinued increased size of RIGHT middle lobe pulmonary nodule most concerning for SYNCHRONOUS BRONCHOGENIC CARCINOMA."  Biopsies revealed:   07/12/15 Diagnosis Lung, needle/core biopsy(ies), Right upper lobe - INVASIVE SQUAMOUS CELL CARCINOMA, SEE COMMENT.  Tobacco/Marijuana/Snuff/ETOH use: current smoker 1 ppd for 50 years.  Currently smoking 1/2 ppd.  He denies using ETOH, marijuana or snuff.  Past/Anticipated interventions by cardiothoracic surgery, if any: not a candidate due to his health status.  Past/Anticipated interventions by medical oncology, if any: not a candidate for chemotherapy  Signs/Symptoms  Weight changes, if any: yes - has lost about 5 lbs in the last 6 months.  Respiratory complaints, if any: yes - shortness of breath with activity.  Reports having a cough with white sputum.  Hemoptysis, if any: no  Pain issues, if any:  no  SAFETY ISSUES:  Prior radiation? Yes 10/12/15-10/22/15 right upper lobe 60 Gy, also had prostate cancer with radiation in 2000  Pacemaker/ICD? no   Possible current pregnancy?no  Is the patient on methotrexate? no  Current Complaints / other details:  Patient is here with his wife.  BP (!) 94/59 (BP Location: Left Arm, Patient Position: Sitting)   Pulse (!) 59   Temp 97.8 F (36.6 C) (Oral)   Ht '5\' 9"'$  (1.753 m)   Wt 175 lb (79.4 kg)   SpO2 95%   BMI 25.84 kg/m    Wt Readings from Last 3 Encounters:  07/17/16 175 lb (79.4 kg)  07/03/16 165 lb 9.6 oz (75.1 kg)  06/15/16 169 lb (76.7 kg)

## 2016-07-17 ENCOUNTER — Encounter: Payer: Self-pay | Admitting: Radiation Oncology

## 2016-07-17 ENCOUNTER — Ambulatory Visit
Admission: RE | Admit: 2016-07-17 | Discharge: 2016-07-17 | Disposition: A | Discharge status: Home / Self Care | Payer: Medicare Other | Source: Ambulatory Visit | Attending: Radiation Oncology | Admitting: Radiation Oncology

## 2016-07-17 VITALS — BP 94/59 | HR 59 | Temp 97.8°F | Ht 69.0 in | Wt 175.0 lb

## 2016-07-17 DIAGNOSIS — Z7982 Long term (current) use of aspirin: Secondary | ICD-10-CM | POA: Diagnosis not present

## 2016-07-17 DIAGNOSIS — C3491 Malignant neoplasm of unspecified part of right bronchus or lung: Secondary | ICD-10-CM

## 2016-07-17 DIAGNOSIS — E559 Vitamin D deficiency, unspecified: Secondary | ICD-10-CM | POA: Insufficient documentation

## 2016-07-17 DIAGNOSIS — Z510 Encounter for antineoplastic radiation therapy: Secondary | ICD-10-CM | POA: Diagnosis not present

## 2016-07-17 DIAGNOSIS — Z7951 Long term (current) use of inhaled steroids: Secondary | ICD-10-CM | POA: Insufficient documentation

## 2016-07-17 DIAGNOSIS — I739 Peripheral vascular disease, unspecified: Secondary | ICD-10-CM | POA: Diagnosis not present

## 2016-07-17 DIAGNOSIS — G20 Parkinson's disease: Secondary | ICD-10-CM | POA: Insufficient documentation

## 2016-07-17 DIAGNOSIS — C342 Malignant neoplasm of middle lobe, bronchus or lung: Secondary | ICD-10-CM | POA: Diagnosis not present

## 2016-07-17 DIAGNOSIS — Z825 Family history of asthma and other chronic lower respiratory diseases: Secondary | ICD-10-CM | POA: Diagnosis not present

## 2016-07-17 DIAGNOSIS — H409 Unspecified glaucoma: Secondary | ICD-10-CM | POA: Insufficient documentation

## 2016-07-17 DIAGNOSIS — F17210 Nicotine dependence, cigarettes, uncomplicated: Secondary | ICD-10-CM | POA: Insufficient documentation

## 2016-07-17 DIAGNOSIS — Z833 Family history of diabetes mellitus: Secondary | ICD-10-CM | POA: Insufficient documentation

## 2016-07-17 DIAGNOSIS — J449 Chronic obstructive pulmonary disease, unspecified: Secondary | ICD-10-CM | POA: Insufficient documentation

## 2016-07-17 DIAGNOSIS — I2510 Atherosclerotic heart disease of native coronary artery without angina pectoris: Secondary | ICD-10-CM | POA: Insufficient documentation

## 2016-07-17 DIAGNOSIS — Z809 Family history of malignant neoplasm, unspecified: Secondary | ICD-10-CM | POA: Insufficient documentation

## 2016-07-17 DIAGNOSIS — Z8546 Personal history of malignant neoplasm of prostate: Secondary | ICD-10-CM | POA: Insufficient documentation

## 2016-07-17 DIAGNOSIS — Z79899 Other long term (current) drug therapy: Secondary | ICD-10-CM | POA: Diagnosis not present

## 2016-07-17 DIAGNOSIS — I482 Chronic atrial fibrillation: Secondary | ICD-10-CM | POA: Diagnosis not present

## 2016-07-17 DIAGNOSIS — Z923 Personal history of irradiation: Secondary | ICD-10-CM | POA: Insufficient documentation

## 2016-07-17 NOTE — Progress Notes (Addendum)
Radiation Oncology         [phone] ________________________________  Outpatient Re-Consultation  Name: Mitchell Wood MRN: [id number]  Date: 07/17/2016  DOB: 12/15/1934  DE:YCXKGYJ,EHUDJ N, MD  Curt Bears, MD   REFERRING PHYSICIAN: Curt Bears, MD  DIAGNOSIS: The encounter diagnosis was Squamous cell carcinoma of right lung ([place]).  HISTORY OF PRESENT ILLNESS::Mitchell Wood is a 81 y.o. male with a primary diagnosis of right middle lobe pulmonary nodule. The patient presented for a CT scan on 05/31/16 which showed the nodule had increased in size, and was concerning for synchronous bronchogenic carcinoma. The patient is here today to discuss the role of radiation in his treatment.  Patient did meet with Dr. Julien Nordmann and the patient is not felt to be a candidate for systemic chemotherapy given his medical issues.  The patient reports being a current smoker of 1 ppd for 50 years. He is currently smoking 1/2 ppd. The patient reports losing 5 lbs in the last 6 months. He reports shortness of breath with activity, and a cough with white sputum. The patient denies chest pain or hemoptysis. The patient is here today with his wife. He presents in a wheelchair.  No hemoptysis  The patient questions if he can do any physical exercise. The patient's wife questions if radiation could affect his COPD.  PREVIOUS RADIATION THERAPY: Yes  10/12/15 - 10/22/15 : right upper lobe treated to 60 Gy (Dr. Pablo Ledger) 2000 : radiation to the prostate  PAST MEDICAL HISTORY:  has a past medical history of Abnormality of gait (06/10/2015); Anxiety; Aortic valve disorder; Atrial fibrillation ([place]); Chronic atrial fibrillation (HCC); COPD (chronic obstructive pulmonary disease) ([place]); Coronary artery disease (10/14/12); ED (erectile dysfunction); ETOH abuse; Glaucoma; Hematospermia; History of radiation therapy (10/12/15-10/22/15); Lung cancer ([place]); Memory change (06/10/2015); Moderate aortic insufficiency (10/11/11);  Parkinsonism ([place]) (06/10/2015); Prostate cancer [facility]); PVD (peripheral vascular disease) ([place]) (10/14/12); RBBB; Tobacco abuse; Ventral hernia; and Vitamin D deficiency.    PAST SURGICAL HISTORY: Past Surgical History:  Procedure Laterality Date  . CARDIAC CATHETERIZATION  10/14/2012   Tubular moderate lesion in the mid LAD, proceed with FFR interrogation to determine physiologic significance  . LEFT HEART CATHETERIZATION WITH CORONARY ANGIOGRAM N/A 10/14/2012   Procedure: LEFT HEART CATHETERIZATION WITH CORONARY ANGIOGRAM;  Surgeon: Lorretta Harp, MD;  Location: [facility] CATH LAB;  Service: Cardiovascular;  Laterality: N/A;  . VENTRAL HERNIA REPAIR      FAMILY HISTORY: family history includes Cancer in his brother, brother, brother, brother, brother, and sister; Diabetes in his child, child, child, and sister; Emphysema in his brother.  SOCIAL HISTORY:  reports that he has been smoking Cigarettes.  He has a 25.00 pack-year smoking history. He has never used smokeless tobacco. He reports that he does not drink alcohol or use drugs.  ALLERGIES: Patient has no known allergies.  MEDICATIONS:  Current Outpatient Prescriptions  Medication Sig Dispense Refill  . aspirin EC 81 MG tablet Take 81 mg by mouth daily.    . budesonide-formoterol (SYMBICORT) 160-4.5 MCG/ACT inhaler Inhale 2 puffs into the lungs 2 (two) times daily. 1 Inhaler 6  . carbidopa-levodopa (SINEMET CR) 50-200 MG tablet Take 1 tablet by mouth 3 (three) times daily. 270 tablet 2  . docusate sodium 100 MG CAPS Take 100 mg by mouth 2 (two) times daily. (Patient taking differently: Take 100 mg by mouth daily. ) 10 capsule 0  . donepezil (ARICEPT) 10 MG tablet Take 1 tablet (10 mg total) by mouth at bedtime. 30 tablet 5  .  ergocalciferol (VITAMIN D2) 50000 UNITS capsule Take 50,000 Units by mouth 2 (two) times a week.    . ferrous sulfate 325 (65 FE) MG tablet Take 1 tablet (325 mg total) by mouth daily with breakfast. 30 tablet 0  .  isosorbide mononitrate (IMDUR) 30 MG 24 hr tablet Take 0.5 tablets (15 mg total) by mouth daily. 45 tablet 1  . latanoprost (XALATAN) 0.005 % ophthalmic solution Place 1 drop into both eyes at bedtime.  6  . metoprolol tartrate (LOPRESSOR) 25 MG tablet Take 1 tablet (25 mg total) by mouth 2 (two) times daily. 180 tablet 3  . sodium chloride (OCEAN) 0.65 % SOLN nasal spray Place 1 spray into both nostrils 3 (three) times daily as needed (For nosebleeds.).     Marland Kitchen acetaminophen (TYLENOL) 325 MG tablet Take 2 tablets (650 mg total) by mouth every 4 (four) hours as needed. (Patient not taking: Reported on 07/17/2016)    . nitroGLYCERIN (NITROSTAT) 0.4 MG SL tablet Place 0.4 mg under the tongue every 5 (five) minutes as needed for chest pain. Reported on 11/25/2015     No current facility-administered medications for this encounter.     REVIEW OF SYSTEMS:  A 15 point review of systems is documented in the electronic medical record. This was obtained by the nursing staff. However, I reviewed this with the patient to discuss relevant findings and make appropriate changes.  Pertinent items noted in HPI and remainder of comprehensive ROS otherwise negative.   PHYSICAL EXAM:  height is '5\' 9"'$  (1.753 m) and weight is 175 lb (79.4 kg). His oral temperature is 97.8 F (36.6 C). His blood pressure is 94/59 (abnormal) and his pulse is 59 (abnormal). His oxygen saturation is 95%.   General: Alert and oriented, in no acute distress HEENT: Head is normocephalic. Extraocular movements are intact. Oropharynx is clear. Neck: Neck is supple, no palpable cervical or supraclavicular lymphadenopathy. Heart: Regular in rate and rhythm with no murmurs, rubs, or gallops. Chest: Clear to auscultation bilaterally, with no rhonchi, wheezes, or rales.    ECOG = 2  LABORATORY DATA:  Lab Results  Component Value Date   WBC 7.2 07/03/2016   HGB 11.5 (L) 07/03/2016   HCT 35.2 (L) 07/03/2016   MCV 85.9 07/03/2016   PLT 201  07/03/2016   NEUTROABS 5.4 07/03/2016   Lab Results  Component Value Date   NA 141 07/03/2016   K 4.4 07/03/2016   CL 102 07/02/2015   CO2 28 07/03/2016   GLUCOSE 97 07/03/2016   CREATININE 0.9 07/03/2016   CALCIUM 9.4 07/03/2016      RADIOGRAPHY:   IMPRESSION: 05/31/16 1. Continued mild contraction of the previously hypermetabolic RIGHT upper lobe nodules following radiation therapy. 2. New nodular focus anterior to the treatment site favored to represent post treatment lung parenchymal changes. Recommend attention on follow-up. 3. Continued increased size of RIGHT middle lobe pulmonary nodule most concerning for SYNCHRONOUS BRONCHOGENIC CARCINOMA. 4. No evidence of mediastinal adenopathy.  IMPRESSION: This is a 81 year old male with a right middle lobe pulmonary nodule, as well as a pertinent past medical history of lung and prostate cancer.  This patient is a good candidate for radiation therapy. We discussed consideration for biopsy of this area but the patient given his medical history and risk for complications does not wish to consider this. He does not wish to meet with pulmonologist or surgeon to discuss further. Patient wished to proceed with treatment without tissue diagnosis   PLAN:  We discussed the risks, benefits, and side effects of radiation therapy, including the potential that radiation could affect the patient's COPD. We discussed the logistics of radiation therapy for the patient's education. Because of the patient's previous radiation treatments another treatment is possible, but will not be as short. There should be no overlap between this and prior treatments.   The patient has elected to move forward with radiation treatment. I have him scheduled for a CT Simulation 07/25/16. This patient will have ~ 10 treatments. Based on the location he is likely not a candidate for SBRT.  Per the patient's inquiry, I encouraged him to stay active to the best of his ability,  but not to overexert himself with strenuous exercise.  I spent 25 minutes face to face with the patient and more than 50% of that time was spent in counseling and/or coordination of care.   ------------------------------------------------  Blair Promise, PhD, MD   This document serves as a record of services personally performed by Gery Pray, MD. It was created on his behalf by Maryla Morrow, a trained medical scribe. The creation of this record is based on the scribe's personal observations and the provider's statements to them. This document has been checked and approved by the attending provider.

## 2016-07-17 NOTE — Progress Notes (Signed)
Please see the Nurse Progress Note in the MD Initial Consult Encounter for this patient. 

## 2016-07-18 NOTE — Addendum Note (Signed)
Encounter addended by: Jacqulyn Liner, RN on: 07/18/2016 12:12 PM<BR>    Actions taken: Charge Capture section accepted

## 2016-07-25 ENCOUNTER — Ambulatory Visit
Admission: RE | Admit: 2016-07-25 | Discharge: 2016-07-25 | Disposition: A | Discharge status: Home / Self Care | Payer: Medicare Other | Source: Ambulatory Visit | Attending: Radiation Oncology | Admitting: Radiation Oncology

## 2016-07-25 DIAGNOSIS — C3491 Malignant neoplasm of unspecified part of right bronchus or lung: Secondary | ICD-10-CM

## 2016-07-25 DIAGNOSIS — Z510 Encounter for antineoplastic radiation therapy: Secondary | ICD-10-CM | POA: Diagnosis not present

## 2016-07-29 NOTE — Progress Notes (Signed)
  Radiation Oncology         [phone] ________________________________  Name: Mitchell Wood MRN: [id number]  Date: 07/25/2016  DOB: [birth year]   STEREOTACTIC BODY RADIOTHERAPY SIMULATION AND TREATMENT PLANNING NOTE    DIAGNOSIS:  RIGHT middle lobe pulmonary nodule  NARRATIVE:  The patient was brought to the [facility].  Identity was confirmed.  All relevant records and images related to the planned course of therapy were reviewed.  The patient freely provided informed written consent to proceed with treatment after reviewing the details related to the planned course of therapy. The consent form was witnessed and verified by the simulation staff.  Then, the patient was set-up in a stable reproducible  supine position for radiation therapy.  A BodyFix immobilization pillow was fabricated for reproducible positioning.  Then I personally applied the abdominal compression paddle to limit respiratory excursion.  4D respiratoy motion management CT images were obtained.  Surface markings were placed.  The CT images were loaded into the planning software.  Then, using Cine, MIP, and standard views, the internal target volume (ITV) and planning target volumes (PTV) were delinieated, and avoidance structures were contoured.  Treatment planning then occurred.  The radiation prescription was entered and confirmed.  A total of two complex treatment devices were fabricated in the form of the BodyFix immobilization pillow and a neck accuform cushion.  I have requested : 3D Simulation  I have requested a DVH of the following structures: Heart, Lungs, Esophagus, Chest Wall, Brachial Plexus, Major Blood Vessels, and targets.  PLAN:  The patient will receive 50 Gy in 10 fractions.  -----------------------------------  Blair Promise, PhD, MD

## 2016-08-02 DIAGNOSIS — Z510 Encounter for antineoplastic radiation therapy: Secondary | ICD-10-CM | POA: Diagnosis not present

## 2016-08-03 ENCOUNTER — Ambulatory Visit
Admission: RE | Admit: 2016-08-03 | Discharge: 2016-08-03 | Disposition: A | Discharge status: Home / Self Care | Payer: Medicare Other | Source: Ambulatory Visit | Attending: Radiation Oncology | Admitting: Radiation Oncology

## 2016-08-03 DIAGNOSIS — C3491 Malignant neoplasm of unspecified part of right bronchus or lung: Secondary | ICD-10-CM

## 2016-08-03 DIAGNOSIS — Z510 Encounter for antineoplastic radiation therapy: Secondary | ICD-10-CM | POA: Diagnosis not present

## 2016-08-03 NOTE — Progress Notes (Signed)
  Radiation Oncology         [phone] ________________________________  Name: Mitchell Wood MRN: [id number]  Date: 08/03/2016  DOB: 1935-01-15  Stereotactic Body Radiotherapy Treatment Procedure Note  NARRATIVE:  Mitchell Wood was brought to the stereotactic radiation treatment machine and placed supine on the CT couch. The patient was set up for stereotactic body radiotherapy on the body fix pillow.  3D TREATMENT PLANNING AND DOSIMETRY:  The patient's radiation plan was reviewed and approved prior to starting treatment.  It showed 3-dimensional radiation distributions overlaid onto the planning CT.  The [facility] for the target structures as well as the organs at risk were reviewed. The documentation of this is filed in the radiation oncology EMR.  SIMULATION VERIFICATION:  The patient underwent CT imaging on the treatment unit.  These were carefully aligned to document that the ablative radiation dose would cover the target volume and maximally spare the nearby organs at risk according to the planned distribution.  SPECIAL TREATMENT PROCEDURE: Mitchell Wood received high dose ablative stereotactic body radiotherapy to the planned target volume without unforeseen complications. Treatment was delivered uneventfully. The high doses associated with stereotactic body radiotherapy and the significant potential risks require careful treatment set up and patient monitoring constituting a special treatment procedure   STEREOTACTIC TREATMENT MANAGEMENT:  Following delivery, the patient was evaluated clinically. The patient tolerated treatment without significant acute effects, and was discharged to home in stable condition.    PLAN: Continue treatment as planned.  ________________________________  Blair Promise, PhD, MD

## 2016-08-04 ENCOUNTER — Ambulatory Visit
Admission: RE | Admit: 2016-08-04 | Discharge: 2016-08-04 | Disposition: A | Discharge status: Home / Self Care | Payer: Medicare Other | Source: Ambulatory Visit | Attending: Radiation Oncology | Admitting: Radiation Oncology

## 2016-08-04 DIAGNOSIS — Z510 Encounter for antineoplastic radiation therapy: Secondary | ICD-10-CM | POA: Diagnosis not present

## 2016-08-07 ENCOUNTER — Ambulatory Visit
Admission: RE | Admit: 2016-08-07 | Discharge: 2016-08-07 | Disposition: A | Discharge status: Home / Self Care | Payer: Medicare Other | Source: Ambulatory Visit | Attending: Radiation Oncology | Admitting: Radiation Oncology

## 2016-08-07 DIAGNOSIS — Z510 Encounter for antineoplastic radiation therapy: Secondary | ICD-10-CM | POA: Diagnosis not present

## 2016-08-07 DIAGNOSIS — C3491 Malignant neoplasm of unspecified part of right bronchus or lung: Secondary | ICD-10-CM

## 2016-08-07 NOTE — Progress Notes (Signed)
  Radiation Oncology         (336) [phone] ________________________________  Name: Mitchell Wood MRN: [id number]  Date: 08/07/2016  DOB: [birth year]  Stereotactic Body Radiotherapy Treatment Procedure Note  NARRATIVE:  Mitchell Wood was brought to the stereotactic radiation treatment machine and placed supine on the CT couch. The patient was set up for stereotactic body radiotherapy on the body fix pillow.  3D TREATMENT PLANNING AND DOSIMETRY:  The patient's radiation plan was reviewed and approved prior to starting treatment.  It showed 3-dimensional radiation distributions overlaid onto the planning CT.  The [facility] for the target structures as well as the organs at risk were reviewed. The documentation of this is filed in the radiation oncology EMR.  SIMULATION VERIFICATION:  The patient underwent CT imaging on the treatment unit.  These were carefully aligned to document that the ablative radiation dose would cover the target volume and maximally spare the nearby organs at risk according to the planned distribution.  SPECIAL TREATMENT PROCEDURE: Mitchell Wood received high dose ablative stereotactic body radiotherapy to the planned target volume without unforeseen complications. Treatment was delivered uneventfully. The high doses associated with stereotactic body radiotherapy and the significant potential risks require careful treatment set up and patient monitoring constituting a special treatment procedure   STEREOTACTIC TREATMENT MANAGEMENT:  Following delivery, the patient was evaluated clinically. The patient tolerated treatment without significant acute effects, and was discharged to home in stable condition.    PLAN: Continue treatment as planned.  ________________________________  Blair Promise, PhD, MD  This document serves as a record of services personally performed by Gery Pray, MD. It was created on his behalf by Bethann Humble, a trained medical scribe. The creation of this record  is based on the scribe's personal observations and the provider's statements to them. This document has been checked and approved by the attending provider.

## 2016-08-08 ENCOUNTER — Ambulatory Visit
Admission: RE | Admit: 2016-08-08 | Discharge: 2016-08-08 | Disposition: A | Discharge status: Home / Self Care | Payer: Medicare Other | Source: Ambulatory Visit | Attending: Radiation Oncology | Admitting: Radiation Oncology

## 2016-08-08 ENCOUNTER — Encounter: Payer: Self-pay | Admitting: Radiation Oncology

## 2016-08-08 VITALS — BP 113/55 | HR 77 | Temp 97.7°F | Ht 69.0 in | Wt 169.4 lb

## 2016-08-08 DIAGNOSIS — C3491 Malignant neoplasm of unspecified part of right bronchus or lung: Secondary | ICD-10-CM

## 2016-08-08 DIAGNOSIS — Z510 Encounter for antineoplastic radiation therapy: Secondary | ICD-10-CM | POA: Diagnosis not present

## 2016-08-08 NOTE — Progress Notes (Signed)
  Radiation Oncology         [phone] ________________________________  Name: Klayten Jolliff MRN: [id number]  Date: 08/08/2016  DOB: [birth year]  Stereotactic Body Radiotherapy Treatment Procedure Note  NARRATIVE:  Millard Bautch was brought to the stereotactic radiation treatment machine and placed supine on the CT couch. The patient was set up for stereotactic body radiotherapy on the body fix pillow.  3D TREATMENT PLANNING AND DOSIMETRY:  The patient's radiation plan was reviewed and approved prior to starting treatment.  It showed 3-dimensional radiation distributions overlaid onto the planning CT.  The [facility] for the target structures as well as the organs at risk were reviewed. The documentation of this is filed in the radiation oncology EMR.  SIMULATION VERIFICATION:  The patient underwent CT imaging on the treatment unit.  These were carefully aligned to document that the ablative radiation dose would cover the target volume and maximally spare the nearby organs at risk according to the planned distribution.  SPECIAL TREATMENT PROCEDURE: [name] received high dose ablative stereotactic body radiotherapy to the planned target volume without unforeseen complications. Treatment was delivered uneventfully. The high doses associated with stereotactic body radiotherapy and the significant potential risks require careful treatment set up and patient monitoring constituting a special treatment procedure   STEREOTACTIC TREATMENT MANAGEMENT:  Following delivery, the patient was evaluated clinically. The patient tolerated treatment without significant acute effects, and was discharged to home in stable condition.    PLAN: Continue treatment as planned.  ________________________________  Blair Promise, PhD, MD

## 2016-08-08 NOTE — Progress Notes (Signed)
  Radiation Oncology         [phone] ________________________________  Name: Mitchell Wood MRN: [id number]  Date: 08/08/2016  DOB: [birth year]  Weekly Radiation Therapy Management    ICD-9-CM ICD-10-CM   1. Squamous cell carcinoma of right lung (HCC) 162.9 C34.91      Current Dose: 20 Gy     Planned Dose:  50 Gy  Narrative . . . . . . . . The patient presents for routine under treatment assessment.  The patient has completed 4 fractions to the right middle lung. Denies an increase in fatigue. He reports some pain in his left side when he leans forward, which started yesterday. He also reports numbness in some of the fingers on his right hand throughout the day. He denies shortness of breath. The patient reports coughing more since radiation started and is drooling more.                                    The patient is without complaint.                                 Set-up films were reviewed.                                 The chart was checked. Physical Findings. . .  height is '5\' 9"'$  (1.753 m) and weight is 169 lb 6.4 oz (76.8 kg). His oral temperature is 97.7 F (36.5 C). His blood pressure is 113/55 (abnormal) and his pulse is 77. His oxygen saturation is 95%. . Weight essentially stable.  No significant changes. Heart regular in rate and rhythm. Lungs clear to auscultation bilaterally. Impression . . . . . . . The patient is tolerating radiation. The patient is ambulatory with a cane. Plan . . . . . . . . . . . . Continue treatment as planned.  ________________________________   Blair Promise, PhD, MD  This document serves as a record of services personally performed by Gery Pray, MD. It was created on his behalf by Maryla Morrow, a trained medical scribe. The creation of this record is based on the scribe's personal observations and the provider's statements to them. This document has been checked and approved by the attending provider.

## 2016-08-08 NOTE — Progress Notes (Signed)
Mitchell Wood has completed 4 fractions to his right middle lung.  He reports having some pain in his left side when he leans forward.  He said this started yesterday.  He denies having shortness of breath.  He reports coughing more since radiation started and is drooling more.  He denies having an increase in fatigue.  BP (!) 113/55 (BP Location: Right Arm, Patient Position: Sitting)   Pulse 77   Temp 97.7 F (36.5 C) (Oral)   Ht '5\' 9"'$  (1.753 m)   Wt 169 lb 6.4 oz (76.8 kg)   SpO2 95%   BMI 25.02 kg/m    Wt Readings from Last 3 Encounters:  08/08/16 169 lb 6.4 oz (76.8 kg)  07/17/16 175 lb (79.4 kg)  07/03/16 165 lb 9.6 oz (75.1 kg)

## 2016-08-09 ENCOUNTER — Ambulatory Visit
Admission: RE | Admit: 2016-08-09 | Discharge: 2016-08-09 | Disposition: A | Discharge status: Home / Self Care | Payer: Medicare Other | Source: Ambulatory Visit | Attending: Radiation Oncology | Admitting: Radiation Oncology

## 2016-08-09 DIAGNOSIS — C3491 Malignant neoplasm of unspecified part of right bronchus or lung: Secondary | ICD-10-CM

## 2016-08-09 DIAGNOSIS — Z510 Encounter for antineoplastic radiation therapy: Secondary | ICD-10-CM | POA: Diagnosis not present

## 2016-08-10 ENCOUNTER — Ambulatory Visit
Admission: RE | Admit: 2016-08-10 | Discharge: 2016-08-10 | Disposition: A | Discharge status: Home / Self Care | Payer: Medicare Other | Source: Ambulatory Visit | Attending: Radiation Oncology | Admitting: Radiation Oncology

## 2016-08-10 DIAGNOSIS — C3491 Malignant neoplasm of unspecified part of right bronchus or lung: Secondary | ICD-10-CM

## 2016-08-10 DIAGNOSIS — Z510 Encounter for antineoplastic radiation therapy: Secondary | ICD-10-CM | POA: Diagnosis not present

## 2016-08-10 NOTE — Progress Notes (Signed)
  Radiation Oncology         [phone] ________________________________  Name: Mitchell Wood MRN: [id number]  Date: 08/10/2016  DOB: 17-Nov-1934  Stereotactic Body Radiotherapy Treatment Procedure Note  NARRATIVE:  Mitchell Wood was brought to the stereotactic radiation treatment machine and placed supine on the CT couch. The patient was set up for stereotactic body radiotherapy on the body fix pillow.  3D TREATMENT PLANNING AND DOSIMETRY:  The patient's radiation plan was reviewed and approved prior to starting treatment.  It showed 3-dimensional radiation distributions overlaid onto the planning CT.  The [facility] for the target structures as well as the organs at risk were reviewed. The documentation of this is filed in the radiation oncology EMR.  SIMULATION VERIFICATION:  The patient underwent CT imaging on the treatment unit.  These were carefully aligned to document that the ablative radiation dose would cover the target volume and maximally spare the nearby organs at risk according to the planned distribution.  SPECIAL TREATMENT PROCEDURE: Mitchell Wood received high dose ablative stereotactic body radiotherapy to the planned target volume without unforeseen complications. Treatment was delivered uneventfully. The high doses associated with stereotactic body radiotherapy and the significant potential risks require careful treatment set up and patient monitoring constituting a special treatment procedure   STEREOTACTIC TREATMENT MANAGEMENT:  Following delivery, the patient was evaluated clinically. The patient tolerated treatment without significant acute effects, and was discharged to home in stable condition.    PLAN: Continue treatment as planned.  ________________________________  Blair Promise, PhD, MD  This document serves as a record of services personally performed by Gery Pray, MD. It was created on his behalf by Bethann Humble, a trained medical scribe. The creation of this record  is based on the scribe's personal observations and the provider's statements to them. This document has been checked and approved by the attending provider.

## 2016-08-11 ENCOUNTER — Ambulatory Visit
Admission: RE | Admit: 2016-08-11 | Discharge: 2016-08-11 | Disposition: A | Discharge status: Home / Self Care | Payer: Medicare Other | Source: Ambulatory Visit | Attending: Radiation Oncology | Admitting: Radiation Oncology

## 2016-08-11 DIAGNOSIS — Z510 Encounter for antineoplastic radiation therapy: Secondary | ICD-10-CM | POA: Diagnosis not present

## 2016-08-14 ENCOUNTER — Ambulatory Visit
Admission: RE | Admit: 2016-08-14 | Discharge: 2016-08-14 | Disposition: A | Discharge status: Home / Self Care | Payer: Medicare Other | Source: Ambulatory Visit | Attending: Radiation Oncology | Admitting: Radiation Oncology

## 2016-08-14 DIAGNOSIS — C3491 Malignant neoplasm of unspecified part of right bronchus or lung: Secondary | ICD-10-CM

## 2016-08-14 DIAGNOSIS — Z510 Encounter for antineoplastic radiation therapy: Secondary | ICD-10-CM | POA: Diagnosis not present

## 2016-08-14 NOTE — Progress Notes (Signed)
  Radiation Oncology         [phone] ________________________________  Name: Mitchell Wood MRN: [id number]  Date: 08/14/2016  DOB: 08-Feb-1935  Stereotactic Body Radiotherapy Treatment Procedure Note  NARRATIVE:  Mitchell Wood was brought to the stereotactic radiation treatment machine and placed supine on the CT couch. The patient was set up for stereotactic body radiotherapy on the body fix pillow.  3D TREATMENT PLANNING AND DOSIMETRY:  The patient's radiation plan was reviewed and approved prior to starting treatment.  It showed 3-dimensional radiation distributions overlaid onto the planning CT.  The [facility] for the target structures as well as the organs at risk were reviewed. The documentation of this is filed in the radiation oncology EMR.  SIMULATION VERIFICATION:  The patient underwent CT imaging on the treatment unit.  These were carefully aligned to document that the ablative radiation dose would cover the target volume and maximally spare the nearby organs at risk according to the planned distribution.  SPECIAL TREATMENT PROCEDURE: Mitchell Wood received high dose ablative stereotactic body radiotherapy to the planned target volume without unforeseen complications. Treatment was delivered uneventfully. The high doses associated with stereotactic body radiotherapy and the significant potential risks require careful treatment set up and patient monitoring constituting a special treatment procedure   STEREOTACTIC TREATMENT MANAGEMENT:  Following delivery, the patient was evaluated clinically. The patient tolerated treatment without significant acute effects, and was discharged to home in stable condition.    PLAN: Continue treatment as planned.  ________________________________  Blair Promise, PhD, MD  This document serves as a record of services personally performed by Gery Pray, MD. It was created on his behalf by Bethann Humble, a trained medical scribe. The creation of this record  is based on the scribe's personal observations and the provider's statements to them. This document has been checked and approved by the attending provider.

## 2016-08-15 ENCOUNTER — Ambulatory Visit
Admission: RE | Admit: 2016-08-15 | Discharge: 2016-08-15 | Disposition: A | Discharge status: Home / Self Care | Payer: Medicare Other | Source: Ambulatory Visit | Attending: Radiation Oncology | Admitting: Radiation Oncology

## 2016-08-15 ENCOUNTER — Encounter: Payer: Self-pay | Admitting: Radiation Oncology

## 2016-08-15 VITALS — BP 113/64 | HR 100 | Temp 97.9°F | Resp 20 | Wt 171.0 lb

## 2016-08-15 DIAGNOSIS — Z510 Encounter for antineoplastic radiation therapy: Secondary | ICD-10-CM | POA: Diagnosis not present

## 2016-08-15 DIAGNOSIS — C3491 Malignant neoplasm of unspecified part of right bronchus or lung: Secondary | ICD-10-CM

## 2016-08-15 NOTE — Progress Notes (Signed)
  Radiation Oncology         [phone] ________________________________  Name: Mitchell Wood MRN: [id number]  Date: 08/15/2016  DOB: 1934/09/15  Stereotactic Body Radiotherapy Treatment Procedure Note  NARRATIVE:  Mitchell Wood was brought to the stereotactic radiation treatment machine and placed supine on the CT couch. The patient was set up for stereotactic body radiotherapy on the body fix pillow.  3D TREATMENT PLANNING AND DOSIMETRY:  The patient's radiation plan was reviewed and approved prior to starting treatment.  It showed 3-dimensional radiation distributions overlaid onto the planning CT.  The [facility] for the target structures as well as the organs at risk were reviewed. The documentation of this is filed in the radiation oncology EMR.  SIMULATION VERIFICATION:  The patient underwent CT imaging on the treatment unit.  These were carefully aligned to document that the ablative radiation dose would cover the target volume and maximally spare the nearby organs at risk according to the planned distribution.  SPECIAL TREATMENT PROCEDURE: Mitchell Wood received high dose ablative stereotactic body radiotherapy to the planned target volume without unforeseen complications. Treatment was delivered uneventfully. The high doses associated with stereotactic body radiotherapy and the significant potential risks require careful treatment set up and patient monitoring constituting a special treatment procedure   STEREOTACTIC TREATMENT MANAGEMENT:  Following delivery, the patient was evaluated clinically. The patient tolerated treatment without significant acute effects, and was discharged to home in stable condition.    PLAN: Continue treatment as planned.  ________________________________  Blair Promise, PhD, MD  This document serves as a record of services personally performed by Gery Pray, MD. It was created on his behalf by Darcus Austin, a trained medical scribe. The creation of this record  is based on the scribe's personal observations and the provider's statements to them. This document has been checked and approved by the attending provider.

## 2016-08-15 NOTE — Progress Notes (Addendum)
SBRT    rad txs Right Middle Lobe lung  9/10 completed,  No skin changes, no c/o difficulty swallowing or chewing, no nausea, appetite good stated, drinking  Water and coffee, smoking 1/2 ppd cigarettes, fatigued 3:01 PM BP 113/64 (BP Location: Right Arm, Patient Position: Sitting, Cuff Size: Normal)   Pulse 100   Temp 97.9 F (36.6 C) (Oral)   Resp 20   Wt 171 lb (77.6 kg)   SpO2 96% Comment: room a ir  BMI 25.25 kg/m   Wt Readings from Last 3 Encounters:  08/15/16 171 lb (77.6 kg)  08/08/16 169 lb 6.4 oz (76.8 kg)  07/17/16 175 lb (79.4 kg)

## 2016-08-15 NOTE — Progress Notes (Signed)
  Radiation Oncology         (336) [phone] ________________________________  Name: Mitchell Wood MRN: [id number]  Date: 08/15/2016  DOB: [birth year]  Weekly Radiation Therapy Management    ICD-9-CM ICD-10-CM   1. Squamous cell carcinoma of right lung (HCC) 162.9 C34.91      Current Dose: 45 Gy     Planned Dose:  50 Gy  Narrative . . . . . . . . The patient presents for routine under treatment assessment.  SBRT rad txs Right Middle Lobe lung 9/10 completed. No skin changes. No c/o difficulty swallowing or chewing, no nausea, appetite good stated, drinking water and coffee, smoking 1/2 ppd cigarettes, fatigued.                                    The patient is without complaint.                                 Set-up films were reviewed.                                 The chart was checked. Physical Findings. . .  Weight essentially stable.  No significant changes. Heart regular in rate and rhythm. Lungs clear to auscultation bilaterally. The patient presents in a wheelchair. Impression . . . . . . . The patient is tolerating radiation. Plan . . . . . . . . . . . . Continue treatment as planned.  ________________________________   Blair Promise, PhD, MD  This document serves as a record of services personally performed by Gery Pray, MD. It was created on his behalf by Darcus Austin, a trained medical scribe. The creation of this record is based on the scribe's personal observations and the provider's statements to them. This document has been checked and approved by the attending provider.

## 2016-08-16 ENCOUNTER — Ambulatory Visit
Admission: RE | Admit: 2016-08-16 | Discharge: 2016-08-16 | Disposition: A | Discharge status: Home / Self Care | Payer: Medicare Other | Source: Ambulatory Visit | Attending: Radiation Oncology | Admitting: Radiation Oncology

## 2016-08-16 ENCOUNTER — Inpatient Hospital Stay
Admission: RE | Admit: 2016-08-16 | Discharge: 2016-08-16 | Disposition: A | Discharge status: Home / Self Care | Payer: Self-pay | Source: Ambulatory Visit | Attending: Radiation Oncology | Admitting: Radiation Oncology

## 2016-08-16 ENCOUNTER — Encounter: Payer: Self-pay | Admitting: Radiation Oncology

## 2016-08-16 VITALS — BP 95/61 | HR 60 | Temp 97.5°F

## 2016-08-16 DIAGNOSIS — C3491 Malignant neoplasm of unspecified part of right bronchus or lung: Secondary | ICD-10-CM

## 2016-08-16 DIAGNOSIS — Z510 Encounter for antineoplastic radiation therapy: Secondary | ICD-10-CM | POA: Diagnosis not present

## 2016-08-16 NOTE — Progress Notes (Signed)
  Radiation Oncology         [phone] ________________________________  Name: Mitchell Wood MRN: [id number]  Date: 08/16/2016  DOB: 09/25/1934  Weekly Radiation Therapy Management    ICD-9-CM ICD-10-CM   1. Squamous cell carcinoma of right lung (HCC) 162.9 C34.91      Current Dose: 50 Gy     Planned Dose:  50 Gy  Narrative . . . . . . . . The patient presents for routine under treatment assessment.  SBRT rad txs Right Middle Lobe lung 10/10 completed. He was walking back to the dressing room after treatment with the assistance of a radiation therapist when his left foot slipped. He fell forward on his knees. He denies having any pain or injuries from the fall.                                     The patient is without complaint.                                 Set-up films were reviewed.                                 The chart was checked. Physical Findings. . .  Weight essentially stable. The patient presents in a wheelchair. I performed an exam of his knees. Good range of motion noted bilaterally, no swelling noted. Impression . . . . . . . The patient tolerated radiation. Plan . . . . . . . . . . . . Routine follow-up in one month.  ________________________________   Blair Promise, PhD, MD  This document serves as a record of services personally performed by Gery Pray, MD. It was created on his behalf by Bethann Humble, a trained medical scribe. The creation of this record is based on the scribe's personal observations and the provider's statements to them. This document has been checked and approved by the attending provider.

## 2016-08-16 NOTE — Progress Notes (Addendum)
Mitchell Wood has completed treatment with 10 fractions to his right lung.  He was walking back to the dressing room after treatment with the assistance of a radiation therapist when his left foot slipped.  He fell forward on his knees.  He denies feeling dizzy before the fall and also denies having any pain or any injuries.  BP 95/61 (BP Location: Right Arm, Patient Position: Sitting)   Pulse 60   Temp 97.5 F (36.4 C) (Oral)   SpO2 98%

## 2016-08-16 NOTE — Progress Notes (Signed)
  Radiation Oncology         (336) [phone] ________________________________  Name: Mitchell Wood MRN: [id number]  Date: 08/16/2016  DOB: 04-10-1935  Stereotactic Body Radiotherapy Treatment Procedure Note  NARRATIVE:  Mitchell Wood was brought to the stereotactic radiation treatment machine and placed supine on the CT couch. The patient was set up for stereotactic body radiotherapy on the body fix pillow.  3D TREATMENT PLANNING AND DOSIMETRY:  The patient's radiation plan was reviewed and approved prior to starting treatment.  It showed 3-dimensional radiation distributions overlaid onto the planning CT.  The [facility] for the target structures as well as the organs at risk were reviewed. The documentation of this is filed in the radiation oncology EMR.  SIMULATION VERIFICATION:  The patient underwent CT imaging on the treatment unit.  These were carefully aligned to document that the ablative radiation dose would cover the target volume and maximally spare the nearby organs at risk according to the planned distribution.  SPECIAL TREATMENT PROCEDURE: Mitchell Wood received high dose ablative stereotactic body radiotherapy to the planned target volume without unforeseen complications. Treatment was delivered uneventfully. The high doses associated with stereotactic body radiotherapy and the significant potential risks require careful treatment set up and patient monitoring constituting a special treatment procedure   STEREOTACTIC TREATMENT MANAGEMENT:  Following delivery, the patient was evaluated clinically. The patient tolerated treatment without significant acute effects, and was discharged to home in stable condition.    PLAN: Continue treatment as planned.  ________________________________  Blair Promise, PhD, MD  This document serves as a record of services personally performed by Gery Pray, MD. It was created on his behalf by Bethann Humble, a trained medical scribe. The creation of this record  is based on the scribe's personal observations and the provider's statements to them. This document has been checked and approved by the attending provider.

## 2016-08-17 ENCOUNTER — Encounter: Payer: Self-pay | Admitting: Radiation Oncology

## 2016-08-17 NOTE — Progress Notes (Signed)
  Radiation Oncology         [phone] ________________________________  Name: Mitchell Wood MRN: [id number]  Date: 08/17/2016  DOB: [birth year]  End of Treatment Note  Diagnosis:   Squamous cell carcinoma of right lung     Indication for treatment:  Curative       Radiation treatment dates:   08/03/16 - 08/16/16  Site/dose:   RML Lung treated to 50 Gy in 10 fractions of 5 Gy  Beams/energy:   SBRT/SRT-VMAT  //  6FFF  Narrative: The patient tolerated radiation treatment relatively well. The patient experienced some fatigue. He denied difficulty swallowing or chewing. He reported a good appetite throughout treatment. The patient continued to smoke 1/2 ppd cigarettes during the course of treatment.  Plan: The patient has completed radiation treatment. The patient will return to radiation oncology clinic for routine followup in one month. I advised them to call or return sooner if they have any questions or concerns related to their recovery or treatment.  -----------------------------------  Blair Promise, PhD, MD  This document serves as a record of services personally performed by Gery Pray, MD. It was created on his behalf by Maryla Morrow, a trained medical scribe. The creation of this record is based on the scribe's personal observations and the provider's statements to them. This document has been checked and approved by the attending provider.

## 2016-08-30 ENCOUNTER — Encounter: Payer: Self-pay | Admitting: Radiation Oncology

## 2016-09-28 ENCOUNTER — Encounter: Payer: Self-pay | Admitting: Radiation Oncology

## 2016-09-28 ENCOUNTER — Ambulatory Visit
Admission: RE | Admit: 2016-09-28 | Discharge: 2016-09-28 | Disposition: A | Discharge status: Home / Self Care | Payer: Medicare Other | Source: Ambulatory Visit | Attending: Radiation Oncology | Admitting: Radiation Oncology

## 2016-09-28 VITALS — BP 101/47 | HR 74 | Temp 97.6°F | Ht 69.0 in | Wt 169.4 lb

## 2016-09-28 DIAGNOSIS — Z7982 Long term (current) use of aspirin: Secondary | ICD-10-CM | POA: Diagnosis not present

## 2016-09-28 DIAGNOSIS — Z79899 Other long term (current) drug therapy: Secondary | ICD-10-CM | POA: Diagnosis not present

## 2016-09-28 DIAGNOSIS — C3491 Malignant neoplasm of unspecified part of right bronchus or lung: Secondary | ICD-10-CM | POA: Insufficient documentation

## 2016-09-28 NOTE — Progress Notes (Addendum)
Mitchell Wood is here for follow up.  He reports having pain in the back of his neck, mostly when watching TV.  He reports he has a wheeze when he breathes.  He reports having a frequent productive cough with white sputum.  He denies having an increase in fatigue.  He denies having any skin irritation in the treatment area.  BP (!) 101/47 (BP Location: Right Arm, Patient Position: Sitting)   Pulse 74   Temp 97.6 F (36.4 C) (Oral)   Ht '5\' 9"'$  (1.753 m)   Wt 169 lb 6.4 oz (76.8 kg)   SpO2 97%   BMI 25.02 kg/m   Wt Readings from Last 3 Encounters:  09/28/16 169 lb 6.4 oz (76.8 kg)  08/15/16 171 lb (77.6 kg)  08/08/16 169 lb 6.4 oz (76.8 kg)

## 2016-09-28 NOTE — Progress Notes (Signed)
Radiation Oncology         [phone] ________________________________  Name: Mitchell Wood MRN: [id number]  Date: 09/28/2016  DOB: Feb 14, 1935  Follow-Up Visit Note  CC: Maximino Greenland, MD  Curt Bears, MD    ICD-9-CM ICD-10-CM   1. Squamous cell carcinoma of right lung (HCC) 162.9 C34.91     Diagnosis:  Squamous cell carcinoma of right lung  Interval Since Last Radiation:  6 weeks 08/03/16-08/16/16 50 Gy to the RML lung in 10 fractions  Narrative:  The patient returns today for routine follow-up. He reports pain in the back of his neck when watching Tv. He complains of wheezing when breathing, and frequent productive cough with white sputum. He denies an increase in fatigue or skin irritation in the treatment area.                             ALLERGIES:  has No Known Allergies.  Meds: Current Outpatient Prescriptions  Medication Sig Dispense Refill  . aspirin EC 81 MG tablet Take 81 mg by mouth daily.    . budesonide-formoterol (SYMBICORT) 160-4.5 MCG/ACT inhaler Inhale 2 puffs into the lungs 2 (two) times daily. 1 Inhaler 6  . carbidopa-levodopa (SINEMET CR) 50-200 MG tablet Take 1 tablet by mouth 3 (three) times daily. 270 tablet 2  . docusate sodium 100 MG CAPS Take 100 mg by mouth 2 (two) times daily. (Patient taking differently: Take 100 mg by mouth daily. ) 10 capsule 0  . donepezil (ARICEPT) 10 MG tablet Take 1 tablet (10 mg total) by mouth at bedtime. 30 tablet 5  . ergocalciferol (VITAMIN D2) 50000 UNITS capsule Take 50,000 Units by mouth 2 (two) times a week.    . ferrous sulfate 325 (65 FE) MG tablet Take 1 tablet (325 mg total) by mouth daily with breakfast. 30 tablet 0  . isosorbide mononitrate (IMDUR) 30 MG 24 hr tablet Take 0.5 tablets (15 mg total) by mouth daily. 45 tablet 1  . latanoprost (XALATAN) 0.005 % ophthalmic solution Place 1 drop into both eyes at bedtime.  6  . metoprolol tartrate (LOPRESSOR) 25 MG tablet Take 1 tablet (25 mg total) by mouth 2  (two) times daily. 180 tablet 3  . nitroGLYCERIN (NITROSTAT) 0.4 MG SL tablet Place 0.4 mg under the tongue every 5 (five) minutes as needed for chest pain. Reported on 11/25/2015    . sodium chloride (OCEAN) 0.65 % SOLN nasal spray Place 1 spray into both nostrils 3 (three) times daily as needed (For nosebleeds.).     Marland Kitchen acetaminophen (TYLENOL) 325 MG tablet Take 2 tablets (650 mg total) by mouth every 4 (four) hours as needed. (Patient not taking: Reported on 08/16/2016)     No current facility-administered medications for this encounter.     Physical Findings: The patient is in no acute distress. Patient is alert and oriented.  height is '5\' 9"'$  (1.753 m) and weight is 169 lb 6.4 oz (76.8 kg). His oral temperature is 97.6 F (36.4 C). His blood pressure is 101/47 (abnormal) and his pulse is 74. His oxygen saturation is 97%. .  No significant changes. Lungs are clear to auscultation bilaterally. Heart has regular rate and rhythm. No palpable cervical, supraclavicular, or axillary adenopathy. Abdomen soft, non-tender, normal bowel sounds.   Lab Findings: Lab Results  Component Value Date   WBC 7.2 07/03/2016   HGB 11.5 (L) 07/03/2016   HCT 35.2 (L) 07/03/2016   MCV  85.9 07/03/2016   PLT 201 07/03/2016    Radiographic Findings: No results found.  Impression:  The patient is recovering from the effects of radiation. Clinically stable with no obvious residual side effects from his treatment.  Plan:  Patient will follow up in May and he will have a CT scan prior to his follow up With Dr. Earlie Server and I.  ____________________________________    This document serves as a record of services personally performed by Gery Pray, MD. It was created on his behalf by Bethann Humble, a trained medical scribe. The creation of this record is based on the scribe's personal observations and the provider's statements to them. This document has been checked and approved by the attending provider.

## 2016-10-13 NOTE — Addendum Note (Signed)
Encounter addended by: Jacqulyn Liner, RN on: 10/13/2016 10:23 AM<BR>    Actions taken: Charge Capture section accepted

## 2016-10-18 ENCOUNTER — Ambulatory Visit (INDEPENDENT_AMBULATORY_CARE_PROVIDER_SITE_OTHER): Payer: Medicare Other | Admitting: Neurology

## 2016-10-18 ENCOUNTER — Encounter: Payer: Self-pay | Admitting: Neurology

## 2016-10-18 VITALS — BP 102/60 | HR 71 | Ht 69.0 in | Wt 167.0 lb

## 2016-10-18 DIAGNOSIS — R269 Unspecified abnormalities of gait and mobility: Secondary | ICD-10-CM

## 2016-10-18 DIAGNOSIS — G20 Parkinson's disease: Secondary | ICD-10-CM

## 2016-10-18 DIAGNOSIS — R413 Other amnesia: Secondary | ICD-10-CM

## 2016-10-18 MED ORDER — CARBIDOPA-LEVODOPA 25-100 MG PO TABS: 0.5000 | ORAL_TABLET | Freq: Three times a day (TID) | ORAL | 2 refills | Status: AC

## 2016-10-18 MED ORDER — DONEPEZIL HCL 10 MG PO TABS: 10.0000 mg | ORAL_TABLET | Freq: Every day | ORAL | 3 refills | Status: AC

## 2016-10-18 NOTE — Progress Notes (Signed)
Reason for visit: Parkinson's disease  Mitchell Wood is an 82 y.o. male  History of present illness:  Mitchell Wood is a 82 year old right-handed black male with a history of Parkinson's disease and a memory disorder and a gait disorder. The patient indicates that since last seen he has had increasing numbers of falls, the last fall was about 10 days ago. The patient uses a cane for an ambulation, but he sometimes will forget to use the cane. The patient is having difficulty with falling forward sometimes when he first gets up, he may freeze with turns and this may result in a fall. The patient is having increasing problems with freezing. He is on Aricept for memory, his wife believes that his memory issues have remained relatively stable over time. The patient continues to drool, he denies any issues with choking with swallowing. The patient goes to the gym about every other day and works out, but he does report a lot of degenerative arthritis and pain in the knees. The patient returns to the office today for an evaluation. He does have lung cancer, he has just finished radiation therapy for this.  Past Medical History:  Diagnosis Date  . Abnormality of gait 06/10/2015  . Anxiety   . Aortic valve disorder    2D ECHO, 11/18/2012 - EF 50-55%, normal  . Atrial fibrillation (HCC)    NUC, 03/31/2009 - No ECG changes, EKG negative for ischemia, clinical correlation recommeneded, mild anteroapical ischemia  . Chronic atrial fibrillation (HCC)    not on Coumadin secondary to GI bleed  . COPD (chronic obstructive pulmonary disease) ([place])    smoker  . Coronary artery disease 10/14/12   50-60% LAD  . ED (erectile dysfunction)   . ETOH abuse    liver disease  . Glaucoma   . Hematospermia   . History of radiation therapy 10/12/15-10/22/15   right upper lobe 60 Gy  . History of radiation therapy 08/03/16 - 08/16/16   RML Lung treated to 50 Gy in 10 fractions of 5 Gy  . Lung cancer ([place])    early stage  .  Memory change 06/10/2015  . Moderate aortic insufficiency 10/11/11   NL LVF  . Parkinsonism ([place]) 06/10/2015  . Prostate cancer ([place])    chemo/radiation 2000  . PVD (peripheral vascular disease) ([place]) 10/14/12   dilated Ao root at cath  . RBBB   . Tobacco abuse   . Ventral hernia   . Vitamin D deficiency     Past Surgical History:  Procedure Laterality Date  . CARDIAC CATHETERIZATION  10/14/2012   Tubular moderate lesion in the mid LAD, proceed with FFR interrogation to determine physiologic significance  . LEFT HEART CATHETERIZATION WITH CORONARY ANGIOGRAM N/A 10/14/2012   Procedure: LEFT HEART CATHETERIZATION WITH CORONARY ANGIOGRAM;  Surgeon: Lorretta Harp, MD;  Location[facility] CATH LAB;  Service: Cardiovascular;  Laterality: N/A;  . VENTRAL HERNIA REPAIR      Family History  Problem Relation Age of Onset  . Diabetes Sister   . Cancer Sister   . Diabetes Child   . Diabetes Child   . Diabetes Child   . Emphysema Brother   . Cancer Brother     prostate  . Allergies      whole family  . Cancer Brother     prostate  . Cancer Brother     prostate  . Cancer Brother     colon  . Cancer Brother     colon  Social history:  reports that he has been smoking Cigarettes.  He has a 25.00 pack-year smoking history. He has never used smokeless tobacco. He reports that he does not drink alcohol or use drugs.   No Known Allergies  Medications:  Prior to Admission medications   Medication Sig Start Date End Date Taking? Authorizing Provider  aspirin EC 81 MG tablet Take 81 mg by mouth daily.   Yes Historical Provider, MD  budesonide-formoterol (SYMBICORT) 160-4.5 MCG/ACT inhaler Inhale 2 puffs into the lungs 2 (two) times daily. 09/23/12  Yes Brand Males, MD  carbidopa-levodopa (SINEMET CR) 50-200 MG tablet Take 1 tablet by mouth 3 (three) times daily. 06/15/16  Yes Kathrynn Ducking, MD  donepezil (ARICEPT) 10 MG tablet Take 1 tablet (10 mg total) by mouth at bedtime. 10/18/16   Yes Kathrynn Ducking, MD  ergocalciferol (VITAMIN D2) 50000 UNITS capsule Take 50,000 Units by mouth 2 (two) times a week.   Yes Historical Provider, MD  ferrous sulfate 325 (65 FE) MG tablet Take 1 tablet (325 mg total) by mouth daily with breakfast. 07/13/14  Yes Donne Hazel, MD  isosorbide mononitrate (IMDUR) 30 MG 24 hr tablet Take 0.5 tablets (15 mg total) by mouth daily. 07/12/16  Yes Lorretta Harp, MD  latanoprost (XALATAN) 0.005 % ophthalmic solution Place 1 drop into both eyes at bedtime. 09/27/14  Yes Historical Provider, MD  metoprolol tartrate (LOPRESSOR) 25 MG tablet Take 1 tablet (25 mg total) by mouth 2 (two) times daily. 01/12/16  Yes Lorretta Harp, MD  nitroGLYCERIN (NITROSTAT) 0.4 MG SL tablet Place 0.4 mg under the tongue every 5 (five) minutes as needed for chest pain. Reported on 11/25/2015   Yes Historical Provider, MD  sodium chloride (OCEAN) 0.65 % SOLN nasal spray Place 1 spray into both nostrils 3 (three) times daily as needed (For nosebleeds.).    Yes Historical Provider, MD  acetaminophen (TYLENOL) 325 MG tablet Take 2 tablets (650 mg total) by mouth every 4 (four) hours as needed. Patient not taking: Reported on 10/18/2016 10/15/12   Erlene Quan, PA-C  carbidopa-levodopa (SINEMET) 25-100 MG tablet Take 0.5 tablets by mouth 3 (three) times daily. 10/18/16   Kathrynn Ducking, MD  docusate sodium 100 MG CAPS Take 100 mg by mouth 2 (two) times daily. Patient not taking: Reported on 10/18/2016 07/13/14   Donne Hazel, MD    ROS:  Out of a complete 14 system review of symptoms, the patient complains only of the following symptoms, and all other reviewed systems are negative.  Drooling Cough, wheezing Daytime sleepiness Achy muscles, walking difficulty Speech difficulty, facial drooping  Blood pressure 102/60, pulse 71, height '5\' 9"'$  (1.753 m), weight 167 lb (75.8 kg).  Physical Exam  General: The patient is alert and cooperative at the time of the  examination.  Skin: No significant peripheral edema is noted.   Neurologic Exam  Mental status: The patient is alert and oriented x 3 at the time of the examination. The Mini-Mental Status Examination done today shows a total score 24/early.   Cranial nerves: Facial symmetry is present. Speech has a stuttering quality, somewhat dysarthric. Extraocular movements are full. Visual fields are full. Masking of the face is seen. The patient has difficulty with drooling.  Motor: The patient has good strength in all 4 extremities.  Sensory examination: Soft touch sensation is symmetric on the face, arms, and legs.  Coordination: The patient has good finger-nose-finger and heel-to-shin bilaterally.  Gait and  station: The patient is able to ambulate independently, he does shuffle his feet, he has significant problems with freezing with turning. Tandem gait was not attempted. Romberg is negative. No drift is seen.  Reflexes: Deep tendon reflexes are symmetric.   Assessment/Plan:  1. Parkinson's disease  2. Gait disorder  3. Memory disorder  The patient is having increasing problems with falls. He will be referred for physical therapy, the Sinemet dose will be increased slightly, he will have the 25/100 mg tablets added to the regimen, he will take one half tablet 3 times a day with the Sinemet CR 50/200 mg tablets. He will follow-up in 5 months.  Jill Alexanders MD 10/18/2016 10:42 AM  [facility] [address] [place], [place] [address]  Phone [phone] Fax [phone]

## 2016-10-18 NOTE — Patient Instructions (Signed)
   We will set up a physical therapy referral for walking. We will increase the sinemet (carbidopa) taking 1/2 of a 25/100 mg tablet three times a day with the 50/200 mg tablets.   Sinemet (carbidopa) may result in confusion or hallucinations, drowsiness, nausea, or dizziness. If any significant side effects are noted, please contact our office. Sinemet may not be well absorbed when taken with high protein meals, if tolerated it is best to take 30-45 minutes before you eat.

## 2016-11-04 ENCOUNTER — Emergency Department (HOSPITAL_COMMUNITY): Payer: Medicare Other

## 2016-11-04 ENCOUNTER — Inpatient Hospital Stay (HOSPITAL_COMMUNITY)
Admission: EM | Admit: 2016-11-04 | Discharge: 2016-11-26 | DRG: 208 | Disposition: E | Payer: Medicare Other | Attending: Surgery | Admitting: Surgery

## 2016-11-04 ENCOUNTER — Inpatient Hospital Stay (HOSPITAL_COMMUNITY): Payer: Medicare Other

## 2016-11-04 DIAGNOSIS — Y92410 Unspecified street and highway as the place of occurrence of the external cause: Secondary | ICD-10-CM

## 2016-11-04 DIAGNOSIS — E875 Hyperkalemia: Secondary | ICD-10-CM | POA: Diagnosis not present

## 2016-11-04 DIAGNOSIS — I452 Bifascicular block: Secondary | ICD-10-CM | POA: Diagnosis present

## 2016-11-04 DIAGNOSIS — I482 Chronic atrial fibrillation: Secondary | ICD-10-CM | POA: Diagnosis present

## 2016-11-04 DIAGNOSIS — I2510 Atherosclerotic heart disease of native coronary artery without angina pectoris: Secondary | ICD-10-CM | POA: Diagnosis not present

## 2016-11-04 DIAGNOSIS — S0121XA Laceration without foreign body of nose, initial encounter: Secondary | ICD-10-CM | POA: Diagnosis present

## 2016-11-04 DIAGNOSIS — Z8546 Personal history of malignant neoplasm of prostate: Secondary | ICD-10-CM

## 2016-11-04 DIAGNOSIS — J9601 Acute respiratory failure with hypoxia: Secondary | ICD-10-CM | POA: Diagnosis present

## 2016-11-04 DIAGNOSIS — J939 Pneumothorax, unspecified: Secondary | ICD-10-CM

## 2016-11-04 DIAGNOSIS — S0281XA Fracture of other specified skull and facial bones, right side, initial encounter for closed fracture: Secondary | ICD-10-CM | POA: Diagnosis present

## 2016-11-04 DIAGNOSIS — K7200 Acute and subacute hepatic failure without coma: Secondary | ICD-10-CM | POA: Diagnosis present

## 2016-11-04 DIAGNOSIS — I481 Persistent atrial fibrillation: Secondary | ICD-10-CM | POA: Diagnosis not present

## 2016-11-04 DIAGNOSIS — I272 Pulmonary hypertension, unspecified: Secondary | ICD-10-CM | POA: Diagnosis present

## 2016-11-04 DIAGNOSIS — I462 Cardiac arrest due to underlying cardiac condition: Secondary | ICD-10-CM | POA: Diagnosis present

## 2016-11-04 DIAGNOSIS — S270XXA Traumatic pneumothorax, initial encounter: Secondary | ICD-10-CM | POA: Diagnosis present

## 2016-11-04 DIAGNOSIS — I9589 Other hypotension: Secondary | ICD-10-CM | POA: Diagnosis not present

## 2016-11-04 DIAGNOSIS — S058X1A Other injuries of right eye and orbit, initial encounter: Secondary | ICD-10-CM | POA: Diagnosis present

## 2016-11-04 DIAGNOSIS — I739 Peripheral vascular disease, unspecified: Secondary | ICD-10-CM | POA: Diagnosis present

## 2016-11-04 DIAGNOSIS — I4819 Other persistent atrial fibrillation: Secondary | ICD-10-CM

## 2016-11-04 DIAGNOSIS — S2242XA Multiple fractures of ribs, left side, initial encounter for closed fracture: Secondary | ICD-10-CM | POA: Diagnosis present

## 2016-11-04 DIAGNOSIS — R402312 Coma scale, best motor response, none, at arrival to emergency department: Secondary | ICD-10-CM | POA: Diagnosis present

## 2016-11-04 DIAGNOSIS — E872 Acidosis: Secondary | ICD-10-CM | POA: Diagnosis present

## 2016-11-04 DIAGNOSIS — G20 Parkinson's disease: Secondary | ICD-10-CM | POA: Diagnosis present

## 2016-11-04 DIAGNOSIS — J9801 Acute bronchospasm: Secondary | ICD-10-CM | POA: Diagnosis present

## 2016-11-04 DIAGNOSIS — Z85118 Personal history of other malignant neoplasm of bronchus and lung: Secondary | ICD-10-CM | POA: Diagnosis not present

## 2016-11-04 DIAGNOSIS — I4891 Unspecified atrial fibrillation: Secondary | ICD-10-CM | POA: Diagnosis not present

## 2016-11-04 DIAGNOSIS — Z66 Do not resuscitate: Secondary | ICD-10-CM | POA: Diagnosis not present

## 2016-11-04 DIAGNOSIS — H5704 Mydriasis: Secondary | ICD-10-CM | POA: Diagnosis present

## 2016-11-04 DIAGNOSIS — J9602 Acute respiratory failure with hypercapnia: Secondary | ICD-10-CM | POA: Diagnosis present

## 2016-11-04 DIAGNOSIS — R34 Anuria and oliguria: Secondary | ICD-10-CM | POA: Diagnosis present

## 2016-11-04 DIAGNOSIS — N170 Acute kidney failure with tubular necrosis: Secondary | ICD-10-CM | POA: Diagnosis present

## 2016-11-04 DIAGNOSIS — J9600 Acute respiratory failure, unspecified whether with hypoxia or hypercapnia: Secondary | ICD-10-CM

## 2016-11-04 DIAGNOSIS — Z7982 Long term (current) use of aspirin: Secondary | ICD-10-CM

## 2016-11-04 DIAGNOSIS — I469 Cardiac arrest, cause unspecified: Secondary | ICD-10-CM

## 2016-11-04 DIAGNOSIS — T1490XA Injury, unspecified, initial encounter: Secondary | ICD-10-CM | POA: Diagnosis present

## 2016-11-04 DIAGNOSIS — N390 Urinary tract infection, site not specified: Secondary | ICD-10-CM | POA: Diagnosis present

## 2016-11-04 DIAGNOSIS — I2699 Other pulmonary embolism without acute cor pulmonale: Secondary | ICD-10-CM | POA: Diagnosis present

## 2016-11-04 DIAGNOSIS — I351 Nonrheumatic aortic (valve) insufficiency: Secondary | ICD-10-CM | POA: Diagnosis present

## 2016-11-04 DIAGNOSIS — I4510 Unspecified right bundle-branch block: Secondary | ICD-10-CM | POA: Diagnosis present

## 2016-11-04 DIAGNOSIS — J95811 Postprocedural pneumothorax: Secondary | ICD-10-CM | POA: Diagnosis present

## 2016-11-04 DIAGNOSIS — J9690 Respiratory failure, unspecified, unspecified whether with hypoxia or hypercapnia: Secondary | ICD-10-CM

## 2016-11-04 DIAGNOSIS — N179 Acute kidney failure, unspecified: Secondary | ICD-10-CM

## 2016-11-04 DIAGNOSIS — R402112 Coma scale, eyes open, never, at arrival to emergency department: Secondary | ICD-10-CM | POA: Diagnosis present

## 2016-11-04 DIAGNOSIS — R402212 Coma scale, best verbal response, none, at arrival to emergency department: Secondary | ICD-10-CM | POA: Diagnosis present

## 2016-11-04 DIAGNOSIS — Z7901 Long term (current) use of anticoagulants: Secondary | ICD-10-CM

## 2016-11-04 DIAGNOSIS — F0280 Dementia in other diseases classified elsewhere without behavioral disturbance: Secondary | ICD-10-CM | POA: Diagnosis present

## 2016-11-04 DIAGNOSIS — H5702 Anisocoria: Secondary | ICD-10-CM | POA: Diagnosis present

## 2016-11-04 DIAGNOSIS — Z9221 Personal history of antineoplastic chemotherapy: Secondary | ICD-10-CM

## 2016-11-04 DIAGNOSIS — D649 Anemia, unspecified: Secondary | ICD-10-CM | POA: Diagnosis present

## 2016-11-04 DIAGNOSIS — R0902 Hypoxemia: Secondary | ICD-10-CM

## 2016-11-04 DIAGNOSIS — I959 Hypotension, unspecified: Secondary | ICD-10-CM

## 2016-11-04 DIAGNOSIS — H409 Unspecified glaucoma: Secondary | ICD-10-CM | POA: Diagnosis present

## 2016-11-04 DIAGNOSIS — Z923 Personal history of irradiation: Secondary | ICD-10-CM

## 2016-11-04 DIAGNOSIS — J449 Chronic obstructive pulmonary disease, unspecified: Secondary | ICD-10-CM | POA: Diagnosis present

## 2016-11-04 LAB — ECHOCARDIOGRAM COMPLETE
FS: 34 % (ref 28–44)
HEIGHTINCHES: 72 in
IV/PV OW: 0.89
LA diam end sys: 38 mm
LA vol index: 28.3 mL/m2
LADIAMINDEX: 1.91 cm/m2
LASIZE: 38 mm
LAVOL: 56.4 mL
LAVOLA4C: 49.2 mL
LDCA: 3.8 cm2
LV PW d: 12.3 mm — AB (ref 0.6–1.1)
LVOTD: 22 mm
WEIGHTICAEL: 2754.87 [oz_av]

## 2016-11-04 LAB — BASIC METABOLIC PANEL
Anion gap: 20 — ABNORMAL HIGH (ref 5–15)
BUN: 72 mg/dL — AB (ref 6–20)
CALCIUM: 7.4 mg/dL — AB (ref 8.9–10.3)
CO2: 10 mmol/L — ABNORMAL LOW (ref 22–32)
CREATININE: 2.96 mg/dL — AB (ref 0.61–1.24)
Chloride: 106 mmol/L (ref 101–111)
GFR calc Af Amer: 22 mL/min — ABNORMAL LOW (ref >60)
GFR, EST NON AFRICAN AMERICAN: 19 mL/min — AB (ref >60)
GLUCOSE: 132 mg/dL — AB (ref 65–99)
Potassium: 6.4 mmol/L (ref 3.5–5.1)
SODIUM: 136 mmol/L (ref 135–145)

## 2016-11-04 LAB — PREPARE FRESH FROZEN PLASMA
UNIT DIVISION: 0
Unit division: 0

## 2016-11-04 LAB — URINALYSIS, ROUTINE W REFLEX MICROSCOPIC
BILIRUBIN URINE: NEGATIVE
GLUCOSE, UA: NEGATIVE mg/dL
KETONES UR: NEGATIVE mg/dL
LEUKOCYTES UA: NEGATIVE
Nitrite: NEGATIVE
PH: 5 (ref 5.0–8.0)
Protein, ur: 100 mg/dL — AB
Specific Gravity, Urine: 1.014 (ref 1.005–1.030)

## 2016-11-04 LAB — BLOOD GAS, ARTERIAL
Acid-base deficit: 17 mmol/L — ABNORMAL HIGH (ref 0.0–2.0)
Bicarbonate: 10.1 mmol/L — ABNORMAL LOW (ref 20.0–28.0)
FIO2: 100
LHR: 18 {breaths}/min
MECHVT: 610 mL
O2 SAT: 94.3 %
PEEP: 5 cmH2O
PO2 ART: 98.2 mmHg (ref 83.0–108.0)
Patient temperature: 96.3
pCO2 arterial: 28.4 mmHg — ABNORMAL LOW (ref 32.0–48.0)
pH, Arterial: 7.167 — CL (ref 7.350–7.450)

## 2016-11-04 LAB — COMPREHENSIVE METABOLIC PANEL
ALK PHOS: 70 U/L (ref 38–126)
ALT: 29 U/L (ref 17–63)
ANION GAP: 20 — AB (ref 5–15)
AST: <5 U/L — ABNORMAL LOW (ref 15–41)
Albumin: 3 g/dL — ABNORMAL LOW (ref 3.5–5.0)
BUN: 66 mg/dL — ABNORMAL HIGH (ref 6–20)
CALCIUM: 8.4 mg/dL — AB (ref 8.9–10.3)
CHLORIDE: 107 mmol/L (ref 101–111)
CO2: 11 mmol/L — AB (ref 22–32)
Creatinine, Ser: 2.79 mg/dL — ABNORMAL HIGH (ref 0.61–1.24)
GFR calc non Af Amer: 20 mL/min — ABNORMAL LOW (ref >60)
GFR, EST AFRICAN AMERICAN: 23 mL/min — AB (ref >60)
Glucose, Bld: 58 mg/dL — ABNORMAL LOW (ref 65–99)
Potassium: 6.1 mmol/L — ABNORMAL HIGH (ref 3.5–5.1)
SODIUM: 138 mmol/L (ref 135–145)
Total Bilirubin: 0.9 mg/dL (ref 0.3–1.2)
Total Protein: 6.9 g/dL (ref 6.5–8.1)

## 2016-11-04 LAB — I-STAT CG4 LACTIC ACID, ED: Lactic Acid, Venous: 13.42 mmol/L (ref 0.5–1.9)

## 2016-11-04 LAB — TYPE AND SCREEN
ABO/RH(D): A POS
Antibody Screen: NEGATIVE
UNIT DIVISION: 0
Unit division: 0

## 2016-11-04 LAB — BPAM RBC
BLOOD PRODUCT EXPIRATION DATE: 201803152359
Blood Product Expiration Date: 201804022359
ISSUE DATE / TIME: 201803101629
ISSUE DATE / TIME: 201803101629
UNIT TYPE AND RH: 9500
Unit Type and Rh: 9500

## 2016-11-04 LAB — BPAM FFP
Blood Product Expiration Date: 201803142359
Blood Product Expiration Date: 201803142359
ISSUE DATE / TIME: 201803101630
ISSUE DATE / TIME: 201803101630
UNIT TYPE AND RH: 6200
Unit Type and Rh: 6200

## 2016-11-04 LAB — CDS SEROLOGY

## 2016-11-04 LAB — LACTIC ACID, PLASMA: Lactic Acid, Venous: 8.6 mmol/L (ref 0.5–1.9)

## 2016-11-04 LAB — I-STAT CHEM 8, ED
BUN: 71 mg/dL — ABNORMAL HIGH (ref 6–20)
CALCIUM ION: 1.05 mmol/L — AB (ref 1.15–1.40)
Chloride: 109 mmol/L (ref 101–111)
Creatinine, Ser: 2.5 mg/dL — ABNORMAL HIGH (ref 0.61–1.24)
GLUCOSE: 56 mg/dL — AB (ref 65–99)
HCT: 36 % — ABNORMAL LOW (ref 39.0–52.0)
HEMOGLOBIN: 12.2 g/dL — AB (ref 13.0–17.0)
Potassium: 5.8 mmol/L — ABNORMAL HIGH (ref 3.5–5.1)
Sodium: 139 mmol/L (ref 135–145)
TCO2: 17 mmol/L (ref 0–100)

## 2016-11-04 LAB — CBC
HCT: 32 % — ABNORMAL LOW (ref 39.0–52.0)
HEMOGLOBIN: 9.8 g/dL — AB (ref 13.0–17.0)
MCH: 22.6 pg — AB (ref 26.0–34.0)
MCHC: 30.6 g/dL (ref 30.0–36.0)
MCV: 73.7 fL — ABNORMAL LOW (ref 78.0–100.0)
Platelets: 154 10*3/uL (ref 150–400)
RBC: 4.34 MIL/uL (ref 4.22–5.81)
RDW: 19.9 % — ABNORMAL HIGH (ref 11.5–15.5)
WBC: 10.9 10*3/uL — ABNORMAL HIGH (ref 4.0–10.5)

## 2016-11-04 LAB — HEMOGLOBIN AND HEMATOCRIT, BLOOD
HEMATOCRIT: 30.9 % — AB (ref 39.0–52.0)
Hemoglobin: 9.6 g/dL — ABNORMAL LOW (ref 13.0–17.0)

## 2016-11-04 LAB — PROTIME-INR
INR: 2.25
Prothrombin Time: 25.2 seconds — ABNORMAL HIGH (ref 11.4–15.2)

## 2016-11-04 LAB — TRIGLYCERIDES: Triglycerides: 68 mg/dL (ref <150)

## 2016-11-04 LAB — ETHANOL

## 2016-11-04 MED ORDER — FENTANYL 2500MCG IN NS 250ML (10MCG/ML) PREMIX INFUSION
25.0000 ug/h | INTRAVENOUS | Status: DC
  Administered 2016-11-04: 10 ug/h via INTRAVENOUS
  Filled 2016-11-04: qty 250

## 2016-11-04 MED ORDER — KCL IN DEXTROSE-NACL 20-5-0.9 MEQ/L-%-% IV SOLN
INTRAVENOUS | Status: DC
  Filled 2016-11-04: qty 1000

## 2016-11-04 MED ORDER — NOREPINEPHRINE BITARTRATE 1 MG/ML IV SOLN
0.0000 ug/min | INTRAVENOUS | Status: DC
  Administered 2016-11-04: 30 ug/min via INTRAVENOUS
  Administered 2016-11-04: 2 ug/min via INTRAVENOUS
  Administered 2016-11-05: 4 ug/min via INTRAVENOUS
  Administered 2016-11-05: 18 ug/min via INTRAVENOUS
  Filled 2016-11-04 (×5): qty 4

## 2016-11-04 MED ORDER — FENTANYL CITRATE (PF) 100 MCG/2ML IJ SOLN: 50.0000 ug | Freq: Once | INTRAMUSCULAR | Status: DC

## 2016-11-04 MED ORDER — CHLORHEXIDINE GLUCONATE 0.12% ORAL RINSE (MEDLINE KIT)
15.0000 mL | Freq: Two times a day (BID) | OROMUCOSAL | Status: DC
  Administered 2016-11-04: 15 mL via OROMUCOSAL

## 2016-11-04 MED ORDER — DEXTROSE-NACL 5-0.9 % IV SOLN
INTRAVENOUS | Status: DC
  Administered 2016-11-04: 21:00:00 via INTRAVENOUS

## 2016-11-04 MED ORDER — PROPOFOL 1000 MG/100ML IV EMUL: 5.0000 ug/kg/min | INTRAVENOUS | Status: DC

## 2016-11-04 MED ORDER — SODIUM POLYSTYRENE SULFONATE 15 GM/60ML PO SUSP
30.0000 g | Freq: Once | ORAL | Status: AC
  Administered 2016-11-04: 30 g
  Filled 2016-11-04: qty 120

## 2016-11-04 MED ORDER — PANTOPRAZOLE SODIUM 40 MG IV SOLR
40.0000 mg | Freq: Every day | INTRAVENOUS | Status: DC
  Administered 2016-11-04 – 2016-11-05 (×2): 40 mg via INTRAVENOUS
  Filled 2016-11-04 (×2): qty 40

## 2016-11-04 MED ORDER — FENTANYL CITRATE (PF) 100 MCG/2ML IJ SOLN
INTRAMUSCULAR | Status: AC
  Filled 2016-11-04: qty 2

## 2016-11-04 MED ORDER — IOPAMIDOL (ISOVUE-300) INJECTION 61%
INTRAVENOUS | Status: AC
  Filled 2016-11-04: qty 100

## 2016-11-04 MED ORDER — ENOXAPARIN SODIUM 40 MG/0.4ML ~~LOC~~ SOLN
40.0000 mg | SUBCUTANEOUS | Status: DC
  Filled 2016-11-04: qty 0.4

## 2016-11-04 MED ORDER — HYDROMORPHONE HCL 2 MG/ML IJ SOLN: 1.0000 mg | INTRAMUSCULAR | Status: DC | PRN

## 2016-11-04 MED ORDER — ONDANSETRON HCL 4 MG PO TABS: 4.0000 mg | ORAL_TABLET | Freq: Four times a day (QID) | ORAL | Status: DC | PRN

## 2016-11-04 MED ORDER — CHLORHEXIDINE GLUCONATE 0.12% ORAL RINSE (MEDLINE KIT)
15.0000 mL | Freq: Two times a day (BID) | OROMUCOSAL | Status: DC
  Administered 2016-11-04 – 2016-11-05 (×3): 15 mL via OROMUCOSAL

## 2016-11-04 MED ORDER — ONDANSETRON HCL 4 MG/2ML IJ SOLN: 4.0000 mg | Freq: Four times a day (QID) | INTRAMUSCULAR | Status: DC | PRN

## 2016-11-04 MED ORDER — MIDAZOLAM HCL 2 MG/2ML IJ SOLN
2.0000 mg | Freq: Once | INTRAMUSCULAR | Status: AC
  Administered 2016-11-04: 2 mg via INTRAVENOUS

## 2016-11-04 MED ORDER — SODIUM CHLORIDE 0.9 % IV SOLN
INTRAVENOUS | Status: DC | PRN
Start: 2016-11-04 — End: 2016-11-06

## 2016-11-04 MED ORDER — FENTANYL CITRATE (PF) 100 MCG/2ML IJ SOLN
INTRAMUSCULAR | Status: AC | PRN
  Administered 2016-11-04: 50 ug via INTRAVENOUS

## 2016-11-04 MED ORDER — SODIUM CHLORIDE 0.9 % IV SOLN
INTRAVENOUS | Status: AC | PRN
  Administered 2016-11-04 (×2): 1000 mL via INTRAVENOUS

## 2016-11-04 MED ORDER — ORAL CARE MOUTH RINSE
15.0000 mL | OROMUCOSAL | Status: DC
  Administered 2016-11-04 – 2016-11-05 (×9): 15 mL via OROMUCOSAL

## 2016-11-04 MED ORDER — ORAL CARE MOUTH RINSE: 15.0000 mL | Freq: Four times a day (QID) | OROMUCOSAL | Status: DC

## 2016-11-04 MED ORDER — PANTOPRAZOLE SODIUM 40 MG PO TBEC: 40.0000 mg | DELAYED_RELEASE_TABLET | Freq: Every day | ORAL | Status: DC

## 2016-11-04 NOTE — ED Notes (Signed)
Dr Sabra Heck given a copy of lactic acid results 13.42 and Chem 8 results

## 2016-11-04 NOTE — ED Notes (Signed)
Critical care at bedside  
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
## 2016-11-04 NOTE — Progress Notes (Signed)
  Echocardiogram 2D Echocardiogram has been performed.  Mitchell Wood 11/03/2016, 10:31 PM

## 2016-11-04 NOTE — ED Notes (Signed)
Report given to Sara, RN.
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
## 2016-11-04 NOTE — ED Notes (Signed)
Radiologist informed this RN that ET was in RIGHT mainstem; Cornett, MD at bedside informed of same; instructed to back out 2 cm

## 2016-11-04 NOTE — ED Notes (Signed)
opthalmology at bedside.
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
## 2016-11-04 NOTE — Progress Notes (Signed)
This RT attempted post intubation ABG without success, report given to [facility]32[facility] RT to attempt. Preparing to transport patient to 632[facility].

## 2016-11-04 NOTE — ED Notes (Signed)
Rt made aware that ett needs to be backed out 2cm

## 2016-11-04 NOTE — ED Notes (Signed)
Pt returned to room. Dr. Malachi Paradise at bedside preparing to place second chest tube. All tubing lines and drains secure and checked after transport.
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
## 2016-11-04 NOTE — Progress Notes (Signed)
Dr. Sabra Heck stated to RT and RN that ETT needed to be withdrawn 2cm. Per Elmyra Ricks, RNMerry Proud, RT has already pulled ETT back 2 cm.
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
## 2016-11-04 NOTE — ED Notes (Signed)
TOTH SUTURE LACERATION TO RT FOREHEAD.
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
## 2016-11-04 NOTE — ED Notes (Signed)
Dr Sabra Heck given a copy of troponin results 1.43
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
## 2016-11-04 NOTE — ED Notes (Signed)
White metal bracelet given to pts wife.

## 2016-11-04 NOTE — ED Notes (Signed)
Mitchell Wood [phone]

## 2016-11-04 NOTE — Procedures (Signed)
Arterial Catheter Insertion Procedure Note Mitchell Wood [id number] 11-Jul-1935  Procedure: Insertion of Arterial Catheter  Indications: Blood pressure monitoring and Frequent blood sampling  Procedure Details Consent: Unable to obtain consent because of emergent medical necessity. Time Out: Verified patient identification, verified procedure, site/side was marked, verified correct patient position, special equipment/implants available, medications/allergies/relevent history reviewed, required imaging and test results available.  Performed  Maximum sterile technique was used including cap, gloves, gown, hand hygiene, mask and sheet. Skin prep: Chlorhexidine; local anesthetic administered 20 gauge catheter was inserted into right radial artery using the Seldinger technique.  Evaluation Blood flow good; BP tracing good. Complications: No apparent complications.   Mitchell Wood, Mitchell Wood 11/03/2016

## 2016-11-04 NOTE — Progress Notes (Signed)
[name] Progress Note Patient Name: Mitchell Wood DOB: 06/12/1935 MRN: [id number]   Date of Service  11/21/2016  HPI/Events of Note  Multiple issues: 1. K+ = 5.8, 2. Temp = 96.1 and 2. Request for ABG.  eICU Interventions  Will order: 1. Kayexalate 30 gm via tube now if OK with Trauma Service. 2. [facility]. 3. ABG now.      Intervention Category Major Interventions: Respiratory failure - evaluation and management;Electrolyte abnormality - evaluation and management;Other:  Lysle Dingwall 10/31/2016, 8:55 PM

## 2016-11-04 NOTE — Progress Notes (Signed)
Withdrew ETT 2cm per MD verbal order. Patient was moved from 27 at the lip to 25 at the lip without complication. RT will continue to monitor.
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
## 2016-11-04 NOTE — ED Notes (Signed)
cornett at bedside and made aware of increase in bleeding from nose and oral blood.
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
## 2016-11-04 NOTE — ED Notes (Signed)
Dr. Sabra Heck made aware of increased blood from nose and in mouth.

## 2016-11-05 ENCOUNTER — Inpatient Hospital Stay (HOSPITAL_COMMUNITY): Payer: Medicare Other

## 2016-11-05 DIAGNOSIS — N170 Acute kidney failure with tubular necrosis: Secondary | ICD-10-CM

## 2016-11-05 DIAGNOSIS — J9600 Acute respiratory failure, unspecified whether with hypoxia or hypercapnia: Secondary | ICD-10-CM

## 2016-11-05 DIAGNOSIS — T1490XA Injury, unspecified, initial encounter: Secondary | ICD-10-CM

## 2016-11-05 LAB — BLOOD GAS, ARTERIAL
ACID-BASE DEFICIT: 18.1 mmol/L — AB (ref 0.0–2.0)
ACID-BASE DEFICIT: 16.5 mmol/L — AB (ref 0.0–2.0)
Acid-base deficit: 20.3 mmol/L — ABNORMAL HIGH (ref 0.0–2.0)
Acid-base deficit: 15.9 mmol/L — ABNORMAL HIGH (ref 0.0–2.0)
BICARBONATE: 7.3 mmol/L — AB (ref 20.0–28.0)
BICARBONATE: 10.5 mmol/L — AB (ref 20.0–28.0)
BICARBONATE: 10 mmol/L — AB (ref 20.0–28.0)
Bicarbonate: 8.9 mmol/L — ABNORMAL LOW (ref 20.0–28.0)
DRAWN BY: 236041
Drawn by: 249101
Drawn by: 244851
Drawn by: 244851
FIO2: 80
FIO2: 80
FIO2: 70
LHR: 18 {breaths}/min
LHR: 30 {breaths}/min
MECHVT: 610 mL
O2 CONTENT: 70 L/min
O2 SAT: 92.5 %
O2 SAT: 93.1 %
O2 Saturation: 88.3 %
O2 Saturation: 91.3 %
PATIENT TEMPERATURE: 98.6
PATIENT TEMPERATURE: 98.6
PCO2 ART: 23.5 mmHg — AB (ref 32.0–48.0)
PEEP/CPAP: 8 cmH2O
PEEP/CPAP: 8 cmH2O
PEEP/CPAP: 8 cmH2O
PEEP: 8 cmH2O
PH ART: 7.119 — AB (ref 7.350–7.450)
PH ART: 7.163 — AB (ref 7.350–7.450)
PH ART: 7.205 — AB (ref 7.350–7.450)
PH ART: 7.196 — AB (ref 7.350–7.450)
PO2 ART: 80.2 mmHg — AB (ref 83.0–108.0)
Patient temperature: 98.6
Patient temperature: 98.6
RATE: 28 resp/min
RATE: 30 resp/min
VT: 610 mL
VT: 610 mL
VT: 610 mL
pCO2 arterial: 25.7 mmHg — ABNORMAL LOW (ref 32.0–48.0)
pCO2 arterial: 27.6 mmHg — ABNORMAL LOW (ref 32.0–48.0)
pCO2 arterial: 27 mmHg — ABNORMAL LOW (ref 32.0–48.0)
pO2, Arterial: 92.5 mmHg (ref 83.0–108.0)
pO2, Arterial: 89.1 mmHg (ref 83.0–108.0)
pO2, Arterial: 69.8 mmHg — ABNORMAL LOW (ref 83.0–108.0)

## 2016-11-05 LAB — COMPREHENSIVE METABOLIC PANEL
ALT: 178 U/L — ABNORMAL HIGH (ref 17–63)
ANION GAP: 21 — AB (ref 5–15)
AST: 981 U/L — ABNORMAL HIGH (ref 15–41)
Albumin: 2.5 g/dL — ABNORMAL LOW (ref 3.5–5.0)
Alkaline Phosphatase: 73 U/L (ref 38–126)
BILIRUBIN TOTAL: 1.4 mg/dL — AB (ref 0.3–1.2)
BUN: 67 mg/dL — ABNORMAL HIGH (ref 6–20)
CO2: 9 mmol/L — ABNORMAL LOW (ref 22–32)
Calcium: 7.4 mg/dL — ABNORMAL LOW (ref 8.9–10.3)
Chloride: 110 mmol/L (ref 101–111)
Creatinine, Ser: 3.55 mg/dL — ABNORMAL HIGH (ref 0.61–1.24)
GFR, EST AFRICAN AMERICAN: 17 mL/min — AB (ref >60)
GFR, EST NON AFRICAN AMERICAN: 15 mL/min — AB (ref >60)
Glucose, Bld: 122 mg/dL — ABNORMAL HIGH (ref 65–99)
POTASSIUM: 6.5 mmol/L — AB (ref 3.5–5.1)
Sodium: 140 mmol/L (ref 135–145)
TOTAL PROTEIN: 5.9 g/dL — AB (ref 6.5–8.1)

## 2016-11-05 LAB — BASIC METABOLIC PANEL
Anion gap: 20 — ABNORMAL HIGH (ref 5–15)
Anion gap: 29 — ABNORMAL HIGH (ref 5–15)
BUN: 63 mg/dL — AB (ref 6–20)
BUN: 51 mg/dL — AB (ref 6–20)
CHLORIDE: 109 mmol/L (ref 101–111)
CHLORIDE: 101 mmol/L (ref 101–111)
CO2: 11 mmol/L — AB (ref 22–32)
CO2: 10 mmol/L — AB (ref 22–32)
CREATININE: 3.49 mg/dL — AB (ref 0.61–1.24)
CREATININE: 3.22 mg/dL — AB (ref 0.61–1.24)
Calcium: 7.1 mg/dL — ABNORMAL LOW (ref 8.9–10.3)
Calcium: 7 mg/dL — ABNORMAL LOW (ref 8.9–10.3)
GFR calc Af Amer: 18 mL/min — ABNORMAL LOW (ref >60)
GFR calc Af Amer: 20 mL/min — ABNORMAL LOW (ref >60)
GFR calc non Af Amer: 15 mL/min — ABNORMAL LOW (ref >60)
GFR calc non Af Amer: 17 mL/min — ABNORMAL LOW (ref >60)
Glucose, Bld: 138 mg/dL — ABNORMAL HIGH (ref 65–99)
Glucose, Bld: 70 mg/dL (ref 65–99)
Potassium: 5.8 mmol/L — ABNORMAL HIGH (ref 3.5–5.1)
Potassium: 5.3 mmol/L — ABNORMAL HIGH (ref 3.5–5.1)
SODIUM: 140 mmol/L (ref 135–145)
SODIUM: 140 mmol/L (ref 135–145)

## 2016-11-05 LAB — HEMOGLOBIN AND HEMATOCRIT, BLOOD
HCT: 29.8 % — ABNORMAL LOW (ref 39.0–52.0)
HCT: 30.2 % — ABNORMAL LOW (ref 39.0–52.0)
HCT: 28.8 % — ABNORMAL LOW (ref 39.0–52.0)
HEMOGLOBIN: 9.2 g/dL — AB (ref 13.0–17.0)
Hemoglobin: 9.1 g/dL — ABNORMAL LOW (ref 13.0–17.0)
Hemoglobin: 8.8 g/dL — ABNORMAL LOW (ref 13.0–17.0)

## 2016-11-05 LAB — CBC
HCT: 30.1 % — ABNORMAL LOW (ref 39.0–52.0)
Hemoglobin: 9.3 g/dL — ABNORMAL LOW (ref 13.0–17.0)
MCH: 22.7 pg — AB (ref 26.0–34.0)
MCHC: 30.9 g/dL (ref 30.0–36.0)
MCV: 73.6 fL — AB (ref 78.0–100.0)
PLATELETS: 165 10*3/uL (ref 150–400)
RBC: 4.09 MIL/uL — ABNORMAL LOW (ref 4.22–5.81)
RDW: 20.4 % — AB (ref 11.5–15.5)
WBC: 15.2 10*3/uL — ABNORMAL HIGH (ref 4.0–10.5)

## 2016-11-05 LAB — PROTIME-INR
INR: 4
Prothrombin Time: 41 seconds — ABNORMAL HIGH (ref 11.4–15.2)

## 2016-11-05 LAB — RENAL FUNCTION PANEL
ALBUMIN: 2.5 g/dL — AB (ref 3.5–5.0)
ANION GAP: 19 — AB (ref 5–15)
BUN: 64 mg/dL — AB (ref 6–20)
CO2: 11 mmol/L — ABNORMAL LOW (ref 22–32)
Calcium: 7.1 mg/dL — ABNORMAL LOW (ref 8.9–10.3)
Chloride: 109 mmol/L (ref 101–111)
Creatinine, Ser: 3.45 mg/dL — ABNORMAL HIGH (ref 0.61–1.24)
GFR, EST AFRICAN AMERICAN: 18 mL/min — AB (ref >60)
GFR, EST NON AFRICAN AMERICAN: 16 mL/min — AB (ref >60)
Glucose, Bld: 138 mg/dL — ABNORMAL HIGH (ref 65–99)
PHOSPHORUS: 8.1 mg/dL — AB (ref 2.5–4.6)
POTASSIUM: 5.8 mmol/L — AB (ref 3.5–5.1)
Sodium: 139 mmol/L (ref 135–145)

## 2016-11-05 LAB — POCT I-STAT 3, ART BLOOD GAS (G3+)
ACID-BASE DEFICIT: 10 mmol/L — AB (ref 0.0–2.0)
Bicarbonate: 14.3 mmol/L — ABNORMAL LOW (ref 20.0–28.0)
O2 SAT: 96 %
PH ART: 7.332 — AB (ref 7.350–7.450)
Patient temperature: 97.5
TCO2: 15 mmol/L (ref 0–100)
pCO2 arterial: 26.8 mmHg — ABNORMAL LOW (ref 32.0–48.0)
pO2, Arterial: 84 mmHg (ref 83.0–108.0)

## 2016-11-05 LAB — LACTIC ACID, PLASMA: Lactic Acid, Venous: 9.3 mmol/L (ref 0.5–1.9)

## 2016-11-05 LAB — APTT: aPTT: 53 seconds — ABNORMAL HIGH (ref 24–36)

## 2016-11-05 MED ORDER — PHENYLEPHRINE HCL 10 MG/ML IJ SOLN
0.0000 ug/min | INTRAMUSCULAR | Status: DC
  Administered 2016-11-05: 100 ug/min via INTRAVENOUS
  Filled 2016-11-05: qty 1

## 2016-11-05 MED ORDER — SODIUM BICARBONATE 8.4 % IV SOLN
100.0000 meq | Freq: Once | INTRAVENOUS | Status: AC
  Administered 2016-11-05: 100 meq via INTRAVENOUS

## 2016-11-05 MED ORDER — CHLORHEXIDINE GLUCONATE CLOTH 2 % EX PADS
6.0000 | MEDICATED_PAD | Freq: Every day | CUTANEOUS | Status: DC
  Administered 2016-11-05: 6 via TOPICAL

## 2016-11-05 MED ORDER — STERILE WATER FOR INJECTION IV SOLN
INTRAVENOUS | Status: DC
  Administered 2016-11-05 (×3): via INTRAVENOUS_CENTRAL
  Filled 2016-11-05 (×6): qty 150

## 2016-11-05 MED ORDER — CHLORHEXIDINE GLUCONATE 0.12 % MT SOLN
OROMUCOSAL | Status: AC
  Filled 2016-11-05: qty 15

## 2016-11-05 MED ORDER — PRISMASOL BGK 4/2.5 32-4-2.5 MEQ/L IV SOLN
INTRAVENOUS | Status: DC
  Administered 2016-11-05 (×3): via INTRAVENOUS_CENTRAL
  Filled 2016-11-05 (×9): qty 5000

## 2016-11-05 MED ORDER — ENOXAPARIN SODIUM 30 MG/0.3ML ~~LOC~~ SOLN: 30.0000 mg | SUBCUTANEOUS | Status: DC

## 2016-11-05 MED ORDER — SODIUM CHLORIDE 0.9 % IV SOLN
INTRAVENOUS | Status: AC
  Administered 2016-11-05: 07:00:00 via INTRAVENOUS

## 2016-11-05 MED ORDER — ALTEPLASE 2 MG IJ SOLR
2.0000 mg | Freq: Once | INTRAMUSCULAR | Status: DC | PRN
Start: 2016-11-05 — End: 2016-11-06

## 2016-11-05 MED ORDER — SODIUM BICARBONATE 8.4 % IV SOLN
INTRAVENOUS | Status: AC
  Filled 2016-11-05: qty 100

## 2016-11-05 MED ORDER — SODIUM BICARBONATE 8.4 % IV SOLN
100.0000 meq | Freq: Once | INTRAVENOUS | Status: AC
  Administered 2016-11-05: 100 meq via INTRAVENOUS
  Filled 2016-11-05: qty 50

## 2016-11-05 MED ORDER — SODIUM POLYSTYRENE SULFONATE 15 GM/60ML PO SUSP
45.0000 g | Freq: Once | ORAL | Status: AC
  Administered 2016-11-05: 45 g
  Filled 2016-11-05: qty 180

## 2016-11-05 MED ORDER — SODIUM CHLORIDE 0.9 % IV SOLN
2.0000 g | Freq: Once | INTRAVENOUS | Status: AC
  Administered 2016-11-05: 2 g via INTRAVENOUS
  Filled 2016-11-05: qty 20

## 2016-11-05 MED ORDER — IPRATROPIUM-ALBUTEROL 0.5-2.5 (3) MG/3ML IN SOLN
3.0000 mL | Freq: Four times a day (QID) | RESPIRATORY_TRACT | Status: DC
  Administered 2016-11-05 (×2): 3 mL via RESPIRATORY_TRACT
  Filled 2016-11-05 (×2): qty 3

## 2016-11-05 MED ORDER — SODIUM CHLORIDE 0.9 % IV SOLN
0.0000 ug/min | INTRAVENOUS | Status: DC
  Administered 2016-11-05: 300 ug/min via INTRAVENOUS
  Administered 2016-11-05 (×2): 290 ug/min via INTRAVENOUS
  Administered 2016-11-05: 160 ug/min via INTRAVENOUS
  Administered 2016-11-05: 300 ug/min via INTRAVENOUS
  Filled 2016-11-05 (×9): qty 4

## 2016-11-05 MED ORDER — SODIUM CHLORIDE 0.9 % IV BOLUS (SEPSIS)
1000.0000 mL | Freq: Once | INTRAVENOUS | Status: AC
  Administered 2016-11-05: 1000 mL via INTRAVENOUS

## 2016-11-05 MED ORDER — SODIUM POLYSTYRENE SULFONATE 15 GM/60ML PO SUSP
30.0000 g | Freq: Once | ORAL | Status: AC
  Administered 2016-11-05: 30 g
  Filled 2016-11-05: qty 120

## 2016-11-05 MED ORDER — SODIUM CHLORIDE 0.9 % IV SOLN
0.0100 [IU]/min | INTRAVENOUS | Status: DC
  Administered 2016-11-05: 0.01 [IU]/min via INTRAVENOUS
  Filled 2016-11-05: qty 2

## 2016-11-05 MED ORDER — NOREPINEPHRINE BITARTRATE 1 MG/ML IV SOLN
0.0000 ug/min | INTRAVENOUS | Status: DC
  Administered 2016-11-05: 32 ug/min via INTRAVENOUS
  Administered 2016-11-05: 45 ug/min via INTRAVENOUS
  Filled 2016-11-05 (×2): qty 16

## 2016-11-05 MED ORDER — FUROSEMIDE 10 MG/ML IJ SOLN
40.0000 mg | Freq: Once | INTRAMUSCULAR | Status: AC
Start: 2016-11-05 — End: 2016-11-05
  Administered 2016-11-05: 40 mg via INTRAVENOUS
  Filled 2016-11-05: qty 4

## 2016-11-05 MED ORDER — SODIUM BICARBONATE 8.4 % IV SOLN
200.0000 meq | Freq: Once | INTRAVENOUS | Status: AC
  Administered 2016-11-05: 200 meq via INTRAVENOUS

## 2016-11-05 MED ORDER — METHYLPREDNISOLONE SODIUM SUCC 125 MG IJ SOLR
60.0000 mg | Freq: Two times a day (BID) | INTRAMUSCULAR | Status: DC
  Administered 2016-11-05: 60 mg via INTRAVENOUS
  Filled 2016-11-05: qty 2

## 2016-11-05 MED ORDER — STERILE WATER FOR INJECTION IV SOLN
INTRAVENOUS | Status: DC
  Administered 2016-11-05 (×2): via INTRAVENOUS_CENTRAL
  Filled 2016-11-05 (×12): qty 150

## 2016-11-05 MED ORDER — HEPARIN SODIUM (PORCINE) 1000 UNIT/ML DIALYSIS: 1000.0000 [IU] | INTRAMUSCULAR | Status: DC | PRN

## 2016-11-05 MED ORDER — SODIUM BICARBONATE 8.4 % IV SOLN
INTRAVENOUS | Status: AC
  Filled 2016-11-05: qty 50

## 2016-11-05 MED ORDER — SODIUM BICARBONATE 8.4 % IV SOLN
INTRAVENOUS | Status: DC
  Administered 2016-11-05 (×2): via INTRAVENOUS
  Filled 2016-11-05 (×2): qty 150

## 2016-11-05 MED ORDER — SODIUM CHLORIDE 0.9 % FOR CRRT
INTRAVENOUS_CENTRAL | Status: DC | PRN
  Filled 2016-11-05: qty 1000

## 2016-11-05 NOTE — Progress Notes (Signed)
[facility] Progress Note Patient Name: [name] DOB: 09-28-1934 MRN: [id number]   Date of Service  11/05/2016  HPI/Events of Note  ABG on 70%/PRVC 30/TV 610/P 8 = 7.19/27/80/10.  eICU Interventions  Will order: 1. NaHCO3 200 meq IV now.  2. ABG at 10 PM.     Intervention Category Major Interventions: Acid-Base disturbance - evaluation and management;Respiratory failure - evaluation and management  Sommer,Steven Cornelia Copa 11/05/2016, 9:08 PM

## 2016-11-05 NOTE — Progress Notes (Signed)
CRITICAL VALUE ALERT  Critical value received: ABG  Date of notification:  *11/05/16  Time of notification:  2058  Critical value read back:Yes.    Nurse who received alert:  Fransico Michael RN BSN  MD notified (1st page):  Sommer  Time of first page:  73  MD notified (2nd page):  Time of second page:  Responding MD:  Oletta Darter  Time MD responded: [phone]
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
## 2016-11-05 NOTE — Procedures (Signed)
Central Venous Catheter Insertion Procedure Note Thomes Burak [id number] August 13, 1935  Procedure: Insertion of Central Venous Catheter Indications: Assessment of intravascular volume, Drug and/or fluid administration and Frequent blood sampling  Procedure Details Consent: Risks of procedure as well as the alternatives and risks of each were explained to the (patient/caregiver).  Consent for procedure obtained. Time Out: Verified patient identification, verified procedure, site/side was marked, verified correct patient position, special equipment/implants available, medications/allergies/relevent history reviewed, required imaging and test results available.  Performed  Maximum sterile technique was used including antiseptics, cap, gloves, gown, hand hygiene, mask and sheet. Skin prep: Chlorhexidine; local anesthetic administered A antimicrobial bonded/coated triple lumen catheter was placed in the right internal jugular vein using the Seldinger technique.  Evaluation Blood flow good Complications: No apparent complications Patient did tolerate procedure well. Chest X-ray ordered to verify placement.  CXR: pending.  Daleisa Halperin 11/05/2016, 12:57 AM

## 2016-11-05 NOTE — Progress Notes (Signed)
   11/05/16 1900  Clinical Encounter Type  Visited With Family;Health care provider  Visit Type Follow-up;Spiritual support  Referral From Nurse  Consult/Referral To Chaplain  Spiritual Encounters  Spiritual Needs Emotional  Stress Factors  Patient Stress Factors None identified  Family Stress Factors Exhausted;Family relationships;Health changes    Chaplain responded to page from pt's nurse for pastoral care to pt's family. Provided ministry of presence and emotional support. Family experiencing tension and defining boundaries at this time. Declined further chaplain services and indicated they would ask for services if they required. Isaic Syler L. Volanda Napoleon, MDiv
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
## 2016-11-05 NOTE — Progress Notes (Signed)
CCM notified regarding pt HR, sustaining above 150 and reaching as high as upper 160s.  Plan to titrate up pressors in attempt to titrate up Fentanyl as well.  Otherwise, no additional treatment for now.  Will monitor/titrate meds.

## 2016-11-05 NOTE — Progress Notes (Signed)
CRITICAL VALUE ALERT  Critical value received:  K+ 6.5  Date of notification:  11/05/2016  Time of notification:  6244  Critical value read back:Yes.    Nurse who received alert:  Tharon Aquas, RN  MD notified (1st page):  Dr. Titus Mould  Time of first page:  1030  MD notified (2nd page):  Time of second page:  Responding MD:  Titus Mould  Time MD responded:  1030
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
## 2016-11-05 NOTE — Progress Notes (Signed)
Per report and patient's wife, SpO2 is not accurately detected through pulse oximetry. Unable to attain better than a 70% SpO2 reading from finger, ear, or forehead. Currently have the MaxFast SpO2 detector on the patient's forehead. ABGs confirm that pulse oximetry is inaccurate. Will continue to monitor patient and update as needed.
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
## 2016-11-05 NOTE — Progress Notes (Signed)
Noted saturations on [place] monitor running in 60's.  Decent waveform noted. [facility] alerts in [place] did NOT alert [place] staff of this.  Check of alert limits in [employer] confirmed that this should have been an alerting value.    Spoke with bedside RN Meghan about this.  She stated that reliability of pulse ox has been in question since late last night.  ABG at that time and also this morning verified that this was a false reading.  Meghan RN also stated that RT has just drawn a repeat ABG.  She also stated that Dr Titus Mould is aware of situation.  Will continue to monitor the situation from [facility]
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
## 2016-11-05 NOTE — Progress Notes (Signed)
[facility] Progress Note Patient Name: Mitchell Wood DOB: 12/26/1934 MRN: [id number]   Date of Service  11/05/2016  HPI/Events of Note  Persistent shock.  Patient with climbing lactic acid and worsening acidosis.  Hgb is stable.  Echo shows dilated RV with reduced fxn which raised concern for PE but INR actually elevated.  Patient transitioned from levophed to phenyephrine secondary to elevated HR. UOP has been poor  eICU Interventions  Bolus 1 liter NS 2 amps of bicarb Bicarb drip Restart levophed with neo in hopes of improving RV fxn     Intervention Category Major Interventions: Hypotension - evaluation and management  Mauri [place], P 11/05/2016, 6:28 AM
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
## 2016-11-05 NOTE — Progress Notes (Signed)
[facility] Progress Note Patient Name: Mitchell Wood DOB: 1935-03-13 MRN: [id number]   Date of Service  11/05/2016  HPI/Events of Note  ABG on 70%/PRVC 30/TV 610/P 8 = 7.20/27.6/69.8. CXR - bilateral chest tubes. No pneumothorax. Already on CRRT and NaHCO3 IV infusion.  eICU Interventions  Will order: 1. NaHCO3 100 meq IV now. 2. ABG at 8 PM.      Intervention Category Major Interventions: Acid-Base disturbance - evaluation and management  Quintasia Theroux [place] 11/05/2016, 6:22 PM

## 2016-11-05 NOTE — Progress Notes (Signed)
[facility] Progress Note Patient Name: Mitchell Wood DOB: 1935/08/11 MRN: [id number]   Date of Service  11/05/2016  HPI/Events of Note  Hypotension - BP = 90/60. Now on Phenylephrine IV infusion at 300 mcg/min and Norepinephrine IV infusion at 40 mcg/min.  eICU Interventions  Will order: 1. Increase ceiling on Norepinephrine IV infusio to 70 mcg/min. 2. ABG STAT. 3. Portable CXR STAT.     Intervention Category Major Interventions: Respiratory failure - evaluation and management;Hypotension - evaluation and management  Lysle Dingwall 11/05/2016, 5:44 PM

## 2016-11-05 NOTE — Procedures (Signed)
Central Venous Catheter Insertion Procedure Note [place] Alarie [id number] [birth year]  Procedure: Insertion of Central Venous Catheter Indications: cvvhd, collar on neck  Procedure Details Consent: Risks of procedure as well as the alternatives and risks of each were explained to the (patient/caregiver).  Consent for procedure obtained. Time Out: Verified patient identification, verified procedure, site/side was marked, verified correct patient position, special equipment/implants available, medications/allergies/relevent history reviewed, required imaging and test results available.  Performed  Maximum sterile technique was used including antiseptics, cap, gloves, gown, hand hygiene, mask and sheet. Skin prep: Chlorhexidine; local anesthetic administered A antimicrobial bonded/coated triple lumen catheter was placed in the right femoral vein due to patient being a dialysis patient using the Seldinger technique.  Evaluation Blood flow good Complications: No apparent complications Patient did tolerate procedure well. Chest X-ray ordered to verify placement.  CXR: normal.  Raylene Miyamoto. 11/05/2016, 12:28 PM  [place]  Lavon Paganini. Titus Mould, MD, [place] Pgr: [facility]

## 2016-11-06 LAB — FACTOR 8 ASSAY: Coagulation Factor VIII: 288 % — ABNORMAL HIGH (ref 57–163)

## 2016-11-26 NOTE — Progress Notes (Signed)
Wasted 115 ml of fentanyl 2546mg in 250 ml with ELianne Bushy

## 2016-11-26 NOTE — Progress Notes (Signed)
Patient deceased and vent turned off

## 2016-11-26 NOTE — Progress Notes (Signed)
Patient in asystole on the monitor, auscultated for heart sounds with Janann August RN, none heard over one minute, family informed that patient had passed.
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
## 2016-11-26 DEATH — deceased

## 2016-12-26 NOTE — Brief Op Note (Signed)
   4:10 PM  PATIENT:  Mitchell Wood  82 y.o. male  PRE-OPERATIVE DIAGNOSIS: forehead laceration  POST-OPERATIVE DIAGNOSIS:  forehead laceration  PROCEDURE:  Repair of forehead laceration  SURGEON:  Toth  PHYSICIAN ASSISTANT:   ASSISTANTS: none   ANESTHESIA:   general  EBL:  No intake/output data recorded.  BLOOD ADMINISTERED:none  DRAINS: none   LOCAL MEDICATIONS USED:  NONE  SPECIMEN:  No Specimen  DISPOSITION OF SPECIMEN:  N/A  COUNTS:  YES  TOURNIQUET:  * No surgery found *  DICTATION: .Dragon Dictation   I was responding to a level I trauma code. The patient had been intubated on arrival. He had a bleeding laceration to his forehead. The laceration was repaired with interrupted 4-0 Prolene stitches. Once the laceration was repaired the area was found to be hemostatic. Sterile dressings were applied. His level I trauma code workup was in progress.  PLAN OF CARE: Admit to inpatient   PATIENT DISPOSITION:  ICU - intubated and critically ill.   Delay start of Pharmacological VTE agent (>24hrs) due to surgical blood loss or risk of bleeding: no
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
## 2017-01-01 ENCOUNTER — Other Ambulatory Visit: Payer: Medicare Other

## 2017-01-08 ENCOUNTER — Ambulatory Visit: Payer: Medicare Other | Admitting: Internal Medicine

## 2017-01-08 ENCOUNTER — Ambulatory Visit: Payer: Self-pay | Admitting: Radiation Oncology

## 2017-03-23 ENCOUNTER — Ambulatory Visit: Payer: Medicare Other | Admitting: Neurology
